# Patient Record
Sex: Female | Born: 1985 | Race: White | Hispanic: No | Marital: Married | Smoking: Current every day smoker
Health system: Southern US, Community
[De-identification: ages and names within clinical notes are randomized; demographics above are authoritative.]

## PROBLEM LIST (undated history)

## (undated) DIAGNOSIS — F329 Major depressive disorder, single episode, unspecified: Secondary | ICD-10-CM

## (undated) DIAGNOSIS — R112 Nausea with vomiting, unspecified: Secondary | ICD-10-CM

## (undated) DIAGNOSIS — F32A Depression, unspecified: Secondary | ICD-10-CM

## (undated) DIAGNOSIS — Z9889 Other specified postprocedural states: Secondary | ICD-10-CM

## (undated) DIAGNOSIS — Z8489 Family history of other specified conditions: Secondary | ICD-10-CM

## (undated) DIAGNOSIS — Z8709 Personal history of other diseases of the respiratory system: Secondary | ICD-10-CM

## (undated) HISTORY — PX: TONSILLECTOMY: SUR1361

## (undated) HISTORY — DX: Depression, unspecified: F32.A

## (undated) HISTORY — DX: Major depressive disorder, single episode, unspecified: F32.9

---
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
## 2005-08-22 ENCOUNTER — Other Ambulatory Visit: Admission: RE | Admit: 2005-08-22 | Discharge: 2005-08-22 | Payer: Self-pay | Admitting: Gynecology

## 2005-10-08 HISTORY — PX: WISDOM TOOTH EXTRACTION: SHX21

## 2006-09-23 ENCOUNTER — Ambulatory Visit: Payer: Self-pay | Admitting: "Endocrinology

## 2006-10-08 LAB — HM PAP SMEAR

## 2009-07-22 ENCOUNTER — Emergency Department (HOSPITAL_COMMUNITY): Admission: EM | Admit: 2009-07-22 | Discharge: 2009-07-22 | Payer: Self-pay | Admitting: Emergency Medicine

## 2011-01-11 LAB — COMPREHENSIVE METABOLIC PANEL
ALT: 22 U/L (ref 0–35)
BUN: 12 mg/dL (ref 6–23)
Chloride: 105 mEq/L (ref 96–112)
Creatinine, Ser: 0.7 mg/dL (ref 0.4–1.2)
GFR calc Af Amer: >60 mL/min (ref >60)
GFR calc non Af Amer: >60 mL/min (ref >60)
Glucose, Bld: 96 mg/dL (ref 70–99)
Sodium: 137 mEq/L (ref 135–145)
Total Bilirubin: 0.8 mg/dL (ref 0.3–1.2)
Total Protein: 7.4 g/dL (ref 6.0–8.3)

## 2011-01-11 LAB — CBC
MCV: 90.5 fL (ref 78.0–100.0)
Platelets: 188 10*3/uL (ref 150–400)
RDW: 13.5 % (ref 11.5–15.5)
WBC: 13.6 10*3/uL — ABNORMAL HIGH (ref 4.0–10.5)

## 2011-01-11 LAB — DIFFERENTIAL
Eosinophils Absolute: 0.3 10*3/uL (ref 0.0–0.7)
Eosinophils Relative: 2 % (ref 0–5)
Lymphs Abs: 3 10*3/uL (ref 0.7–4.0)
Monocytes Relative: 4 % (ref 3–12)
Neutro Abs: 9.7 10*3/uL — ABNORMAL HIGH (ref 1.7–7.7)

## 2011-01-11 LAB — URINALYSIS, ROUTINE W REFLEX MICROSCOPIC
Bilirubin Urine: NEGATIVE
Glucose, UA: NEGATIVE mg/dL
Leukocytes, UA: NEGATIVE
Urobilinogen, UA: 0.2 mg/dL (ref 0.0–1.0)

## 2011-01-11 LAB — URINE MICROSCOPIC-ADD ON

## 2011-01-11 LAB — POCT PREGNANCY, URINE: Preg Test, Ur: NEGATIVE

## 2011-02-23 NOTE — Discharge Summary (Signed)
NAME:  Allison Allison [name], Allison Allison [name]                   ACCOUNT NO.:  [id number]   MEDICAL RECORD NO.:  [id number]                   PATIENT TYPE:  INP   LOCATION:  0105                                 FACILITY:  [facility]   PHYSICIAN:  Nawar M. Alnaquib, M.D.             DATE OF BIRTH:  03/01/1986   DATE OF ADMISSION:  04/13/2003  DATE OF DISCHARGE:  04/17/2003                                 DISCHARGE SUMMARY   REASON FOR ADMISSION:  The patient was admitted for the reason of overdosing  on Wellbutrin.   HISTORY OF PRESENT ILLNESS:  This is Allison [name] 25 year old white female, 12th  grader, overdosed on Wellbutrin by taking several tablets the night before  admission.  The next day, she was brought by her family to [facility] and then over to [facility].  The patient  was depressed further.  The weekend of admission, she was having suicidal  thoughts and she had lied to her mom about having sex with her boyfriend.  She felt very guilty and then overdosed on Wellbutrin.  The patient had been  off Wellbutrin since May 2004.   JUSTIFICATION FOR 24-HOUR CARE:  Dangerous to self.   PAST PSYCHIATRIC HISTORY:  No previous psychiatric admissions but has had  some counseling.   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   MENTAL STATUS EXAM:  On admission, she was alert, well-focused, depressed.  Mood improved.  No suicidal or homicidal ideation.  Thoughts were racing.  Labile.  No psychotic symptoms.  No auditory or visual hallucinations.  No  delusions.   ADMISSION DIAGNOSES:   AXIS I:  1. Major depressive disorder, recurrent, with overdose on Wellbutrin.  2. Dysthymia.  3. History of substance abuse (marijuana).   AXIS II:  Borderline personality traits.   AXIS III:  None.   AXIS IV:  Psychosocial stressors (parent-child relation problems).   AXIS V:  Global Assessment of Functioning 35.   HOSPITAL COURSE:   The patient was started on Lexapro 10 mg and she did well  with that.  She participated in group and individual therapy and also did  well with the following laboratory tests.   LABORATORY DATA:  CBC differential count, blood chemistry, urinalysis, liver  profile and thyroid function tests were all normal.  No additional  laboratory tests were carried out.   MENTAL STATUS EXAM:  On discharge, alert, interactive, euthymic.  Normal  speech, not pressured.  No psychotic symptoms.  No depression.  Not suicidal  or homicidal.  Insight and judgment were remarkably improved.   DISCHARGE DIAGNOSES:   AXIS I:  Major depressive disorder, in remission.   AXIS II:  Borderline personality traits.   AXIS III:  None.   AXIS IV:  Parent-child relation problems (mother and stepbrother).   AXIS V:  Global Assessment of Functioning 70.   PLAN:  Discharge home.  FOLLOW UP:  [facility] with Mckinley Jewel on April 30, 2003.                                               Nawar M. Alnaquib, M.D.    NMA/MEDQ  D:  05/29/2003  T:  05/30/2003  Job:  [id number]

## 2011-02-23 NOTE — H&P (Signed)
NAME:  Allison Allison [name], Allison Allison [name]                   ACCOUNT NO.:  [id number]   MEDICAL RECORD NO.:  [id number]                   PATIENT TYPE:  INP   LOCATION:  0105                                 FACILITY:  [facility]   PHYSICIAN:  Nawar M. Alnaquib, M.D.             DATE OF BIRTH:  [birth year]   DATE OF ADMISSION:  04/13/2003  DATE OF DISCHARGE:  04/17/2003                         PSYCHIATRIC ADMISSION ASSESSMENT   REASON FOR ADMISSION:  The patient was admitted for the reason of overdosing  on Wellbutrin.   HISTORY OF PRESENT ILLNESS:  This is Allison [name] 25 year old white female 12th grader  overdosed on Wellbutrin by taking seven tablets on Monday night, the night  before admission.  On Tuesday, she was brought by her family to [facility] and then brought over to [facility].  The patient was depressed but was __________ .  However, the  weekend of admission, she was having suicidal thoughts __________ .  She has  lied to her mom about __________ .  The family __________  .  The patient  has seen __________  since May 2004.   JUSTIFICATION FOR 24 HOUR CARE:  Dangerous to self.   PAST PSYCHIATRIC HISTORY:  No prior psychiatric admissions or therapy.  She  has been August 02, 2004__________  for some time for __________ .   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   MENTAL STATUS EXAM ON ADMISSION:  Alert, well focused, depressed.  Mood:  Improved, sort of.  No suicidal or homicidal ideations, however.  Thoughts:  Racing __________ , labile __________  no psychotic symptoms, no auditory  and visual hallucinations, no delusions.  __________  fair.  Cognitive  ability: Fair.  __________   ADMISSION DIAGNOSES:   AXIS I:  1. Major depressive disorder, recurrent, with overdose on Wellbutrin.  2. Dysthymia.  3. History of substance abuse, marijuana.   AXIS II:  Borderline personality traits.   AXIS III:   None.   AXIS IV:  Psychosocial stressors: Parent-child relational problems, mother  and different __________   AXIS V:  Global assessment of functioning 35.   HOSPITAL COURSE:  The patient was started on Lexapro 10 mg Thursday.  The  following were the laboratory tests, which were diagnosed during her stay in  the hospital: Urinalysis was clear.  Urine for GC probe was negative.  CBC  and differential counts were within normal limits.  Chemistry 17 was normal.  Liver profile was normal.  TSH and T4, thyroid functions were normal.  Dipstick was normal.  Urine pregnancy test was negative.  RPR was  nonreactive.  __________  negative.  There were no additional laboratory  tests carried out.   __________  she participated well in group and individual therapy and family  meeting.  Prior to discharge, she seemed very well.  On April 17, 2003, she  was discharged, expressed feeling  well, ready to go home after the family  meeting, which went very well.   MENTAL STATUS EXAM ON DISCHARGE:  Alert, interactive, euthymic __________ .  Normal speech, not pressurized.  Ideas: No psychotic symptoms, no  depression, no suicidal or homicidal ideation.  Insight and judgment: Fairly  good.   DISCHARGE DIAGNOSES:   AXIS I:  Major depressive disorder, in remission.   AXIS II:  Borderline personality traits.   AXIS III:  None.   AXIS IV:  Parent-child relational problems, mother and stepfather.   AXIS V:  Global assessment of functioning 70.   PLAN:  Plan was for discharge home.   MEDICATIONS:  None.   FOLLOW UP:  [facility] with Mckinley Jewel on July 23  and he will be her __________  of psychiatric care post hospital discharge.  __________                                               Laney Pastor. Alnaquib, M.D.    NMA/MEDQ  D:  05/08/2003  T:  05/10/2003  Job:  [id number]

## 2011-02-23 NOTE — H&P (Signed)
NAME:  Allison [name], Allison [name]                   ACCOUNT NO.:  [id number]   MEDICAL RECORD NO.:  [id number]                   PATIENT TYPE:  INP   LOCATION:  0105                                 FACILITY:  [facility]   PHYSICIAN:  Nawar M. Alnaquib, M.D.             DATE OF BIRTH:  02-22-1986   DATE OF ADMISSION:  04/13/2003  DATE OF DISCHARGE:                         PSYCHIATRIC ADMISSION ASSESSMENT   HISTORY OF THE PRESENT ILLNESS:  [age]-year-old white female, 12th  grader, who overdosed on Wellbutrin, taking seven tablets on the night  before admission which was April 13, 2003, and the next day had been taken to  [facility], who eventually referred her to [facility].  The patient reports that she had been depressed, but her  depression she felt was getting better until the weekend of admission when  she had gone and smoked marijuana and had sex with her boyfriend and she  felt that she had lied to her mom about it.  She was ashamed of herself,  took an overdose of Wellbutrin, and then started throwing up repeatedly.  Mom had felt that she had been pregnant, but finally she admitted to her  that she had taken the overdose of Wellbutrin, so she was admitted here.  There is [name] major fearing of rejection on behalf of the patient by the rest  of the family members and she has been depressed for about two years and has  been on Wellbutrin until May 2004 when the patient stopped it.   JUSTIFICATION FOR 24 HOUR CARE:  Dangerous to self.   PAST PSYCHIATRIC HISTORY:  No prior admission.  Never been to see [name]  therapist.  She had been on Zoloft in the past and Effexor.  She was also on  the previous medication.  Dr. Senaida Ores was the one who prescribed these  medications, but the patient and mom had fired her since they felt she only  gave the medication to [place], but did not do therapy.   SUBSTANCE ABUSE HISTORY:  Marijuana positive.  History of alcohol  abuse in  the 9th grade and since then, now that she is in 12th grade, she had not  drank anything, she reports.   PAST MEDICAL HISTORY:  None.   ALLERGIES:  None.   CURRENT MEDICATIONS:  None.   MENTAL STATUS EXAM:  Alert, well focused, depressed; however, no suicidal  ideation.  No homicidal ideation.  She has severe feelings about hurting her  mom.  She appears very labile, impulsive, tearful.  There were, however, no  psychotic symptoms.  No auditory or visual hallucinations.  No delusions.  Insight and judgment only fair.  Cognitivity very good.  The patient's asset  is [name] good IQ and presents with [name] personality.   FAMILY HISTORY:  Strong for depression in mother, grandmother, grandfather,  who is mentally ill also.  Father also suffered from depression.   SOCIAL HISTORY:  The patient lives with mom and stepfather in [place].  She has two half sisters living with them.  One full brother who is in  college.  Mom is an Biomedical scientist and father is self-employed.  She __________  her college fund.  Stepfather is retired Acupuncturist.  The patient does not  like him.  He hates me, she says.  He has never abused her, however.    ADMITTING DIAGNOSES:   AXIS I:  1. Major depressive disorder, recurrent, with overdose attempt on     Wellbutrin.  2. Dysthymia.  3. History of substance abuse, alcohol.  4. Ongoing substance abuse, marijuana.   AXIS II:  Borderline personality trait.   AXIS III:  None.   AXIS IV:  Psychosocial stressors, severe.  Parent-child relation problems  with parents and siblings.   AXIS V:  Global assessment of functioning 25.   ESTIMATED LENGTH OF STAY:  One week.   PLACEMENT:  Home.   INITIAL PLAN OF CARE:  Group therapy.  Family meeting.  Medications--Lexapro  10 mg per day.                                               Nawar M. Alnaquib, M.D.    NMA/MEDQ  D:  04/17/2003  T:  04/17/2003  Job:  [id number]

## 2011-07-09 ENCOUNTER — Ambulatory Visit (INDEPENDENT_AMBULATORY_CARE_PROVIDER_SITE_OTHER): Payer: Self-pay | Admitting: Family Medicine

## 2011-07-09 ENCOUNTER — Encounter: Payer: Self-pay | Admitting: Family Medicine

## 2011-07-09 DIAGNOSIS — F329 Major depressive disorder, single episode, unspecified: Secondary | ICD-10-CM

## 2011-07-09 DIAGNOSIS — E785 Hyperlipidemia, unspecified: Secondary | ICD-10-CM

## 2011-07-09 DIAGNOSIS — G43909 Migraine, unspecified, not intractable, without status migrainosus: Secondary | ICD-10-CM | POA: Insufficient documentation

## 2011-07-09 DIAGNOSIS — E669 Obesity, unspecified: Secondary | ICD-10-CM

## 2011-07-09 DIAGNOSIS — F17200 Nicotine dependence, unspecified, uncomplicated: Secondary | ICD-10-CM

## 2011-07-09 MED ORDER — ESCITALOPRAM OXALATE 20 MG PO TABS: 40.0000 mg | ORAL_TABLET | Freq: Every day | ORAL | Status: DC

## 2011-07-09 NOTE — Patient Instructions (Addendum)
Check your records about a tetanus shot.   Please get the records form filled out and sent to our office. Keep working on your weight with diet and walking. I would continue the counseling with Dr. Ledon Snare.   I want to see you back in 12/12 for a visit, sooner if needed.   Take care.

## 2011-07-09 NOTE — Assessment & Plan Note (Signed)
Requesting records (pt didn't have the clinic address, so she'll get this and then drop off the form here for [place] to send).  No SI/HI, okay for outpatient f/u.  Continue counseling and rx done today for lexapro.  She agrees with plan.

## 2011-07-09 NOTE — Assessment & Plan Note (Signed)
D/w pt about diet and weight.  She is motivate and educated via nutrition.  I encouraged her today.

## 2011-07-09 NOTE — Progress Notes (Signed)
"  I'm trying to get healthy, trying to work on mood and weight."  Down 27 lbs with diet.  Prev had lost 70 lbs but regained it after her wedding.  She thinks weight is related to depression. Some boredom eating, some desire for comfort food followed by guilt.  Inc appetite before menses, esp salt cravings.    Depression/anxiety:  No SI/HI now.  Had a SA high school, with wellbutrin.  1 week inpatient stay.  No SA since then.  Back on lexapro for 4 weeks.  Inc in energy now on the medicine.  Less anxiety- prev had some social phobia, "I was becoming a shut in."  More interest in activities now on medicine.  Less guilty feeling.  Fewer migraines on SSRI recently.    Elevated Cholesterol: Using medications without problems:yes Muscle aches: no Diet compliance:yes Exercise:walking Other complaints:as above Eating 2-3 meals a day with snacks.  She has gone through nutrition prev.    Smoking, d/w pt.  She is not yet ready to quit.  Smokes menthols.  No sig cough, sputum.  No wheeze.    PMH and SH reviewed  Meds, vitals, and allergies reviewed.   ROS: See HPI.  Otherwise negative.    NAD overweight Speech fluent Judgement intact Affect wnl NCAT RRR CTAB No tremor

## 2011-07-09 NOTE — Assessment & Plan Note (Signed)
Improved on SSRI 

## 2011-07-09 NOTE — Assessment & Plan Note (Signed)
Requesting records.  No change in meds now.

## 2011-07-09 NOTE — Assessment & Plan Note (Signed)
Flu shot done today.  She'll work on smoking after her weight and mood are addressed.

## 2011-07-19 ENCOUNTER — Telehealth: Payer: Self-pay | Admitting: *Deleted

## 2011-07-19 NOTE — Telephone Encounter (Signed)
Pharmacy is questioning pt's lexapro dose. They said 40 mg's daily is a high dose, usually 20 mg's a day is the highest prescribed.

## 2011-07-19 NOTE — Telephone Encounter (Signed)
This was intended.  She had responded to that dose.  I would continue with 40mg  a day.  Thanks.

## 2011-07-20 NOTE — Telephone Encounter (Signed)
Pharmacy advised  

## 2011-07-24 ENCOUNTER — Telehealth: Payer: Self-pay | Admitting: Family Medicine

## 2011-07-24 MED ORDER — ESCITALOPRAM OXALATE 20 MG PO TABS: 60.0000 mg | ORAL_TABLET | Freq: Every day | ORAL | Status: DC

## 2011-07-24 NOTE — Telephone Encounter (Signed)
Call from Dr. Ledon Snare.  Much improved on lexparo, inc in energy, dec in anxiety, no SI/HI.  We talked about options.  Will inc lexapro to 60mg  and she'll f/u with Dr. Ledon Snare in 2 weeks.  Overall improved.  rx sent.

## 2011-07-26 ENCOUNTER — Encounter: Payer: Self-pay | Admitting: Family Medicine

## 2011-09-19 ENCOUNTER — Ambulatory Visit (INDEPENDENT_AMBULATORY_CARE_PROVIDER_SITE_OTHER): Payer: 59 | Admitting: Family Medicine

## 2011-09-19 ENCOUNTER — Encounter: Payer: Self-pay | Admitting: Family Medicine

## 2011-09-19 DIAGNOSIS — F329 Major depressive disorder, single episode, unspecified: Secondary | ICD-10-CM

## 2011-09-19 DIAGNOSIS — E785 Hyperlipidemia, unspecified: Secondary | ICD-10-CM

## 2011-09-19 MED ORDER — BUPROPION HCL ER (XL) 300 MG PO TB24: 300.0000 mg | ORAL_TABLET | Freq: Every day | ORAL | Status: DC

## 2011-09-19 NOTE — Patient Instructions (Addendum)
Please get me a copy of the labs from the most recent labs draws.  Good luck with the holidays- I think walking is a great idea to get a break.  Let me know if you have concerns.   Come back and see me in April, sooner if needed.  Glad to see you.

## 2011-09-19 NOTE — Progress Notes (Signed)
She's off lipitor and losing weight intentionally.  She'll drop off copy of labs and we can address the lipids after the weight is stabilized.    Depressed Mood: much improved.  Sleep decreased/ Insomnia/Early awakening: no,sleeping well Interest decreased in activities: no, this is improved and she's getting out of the house more Guilt or worthlessness: no Energy decreased: it's improved from prev Concentration difficulties: some, about the same as prev Appetite disturbance or weight loss: losing weight intentionally, but cutting carbs and walking Psychomotor retardation/agitation: no Suicidal thoughts: no Bright affect today.  Still in counseling, doing with this.   Migraines are very rare now.    The wellbutrin is helping with smoking urges.    PMH and SH reviewed  Meds, vitals, and allergies reviewed.   ROS: See HPI.  Otherwise negative.    NAD Speech fluent Judgement intact Affect bright NCAT RRR CTAB No tremor

## 2011-09-20 ENCOUNTER — Encounter: Payer: Self-pay | Admitting: Family Medicine

## 2011-09-20 NOTE — Assessment & Plan Note (Signed)
Hold statin for now and I'll look at her labs.  She's losing weight and we can address this in the future.

## 2011-09-20 NOTE — Assessment & Plan Note (Addendum)
Much improved, continue meds, no SI/HI.  Continue counseling and we'll recheck in a few months here in clinic, sooner if needed.  She agrees with plan.  Okay for outpatient f/u.

## 2011-10-22 ENCOUNTER — Telehealth: Payer: Self-pay | Admitting: *Deleted

## 2011-10-22 NOTE — Telephone Encounter (Signed)
Dr. Ledon Snare says that the patient is going on a cruise and is having a lot of anxiety about being around people, crowds, etc.  He is asking if you would prescribe Xanax (Extended Release) .5 mg  #10/ 0 RF to [facility] in [place] [phone].  Please advise patient at [phone].  Patient is leaving for the cruise on Thursday.

## 2011-10-23 MED ORDER — ALPRAZOLAM ER 0.5 MG PO TB24: ORAL_TABLET | ORAL | Status: DC

## 2011-10-23 NOTE — Telephone Encounter (Signed)
Rx called to pharmacy as instructed. Patient notified by way of voicemail that rx has been called to pharmacy.

## 2011-10-23 NOTE — Telephone Encounter (Signed)
I agree.  Please call in and notify pt.  Thanks.

## 2011-12-04 ENCOUNTER — Encounter (INDEPENDENT_AMBULATORY_CARE_PROVIDER_SITE_OTHER): Payer: 59 | Admitting: Registered Nurse

## 2011-12-04 DIAGNOSIS — O209 Hemorrhage in early pregnancy, unspecified: Secondary | ICD-10-CM

## 2011-12-06 ENCOUNTER — Encounter (INDEPENDENT_AMBULATORY_CARE_PROVIDER_SITE_OTHER): Payer: 59 | Admitting: Registered Nurse

## 2011-12-06 ENCOUNTER — Other Ambulatory Visit (INDEPENDENT_AMBULATORY_CARE_PROVIDER_SITE_OTHER): Payer: 59

## 2011-12-06 ENCOUNTER — Encounter (INDEPENDENT_AMBULATORY_CARE_PROVIDER_SITE_OTHER): Payer: 59

## 2011-12-06 DIAGNOSIS — O26859 Spotting complicating pregnancy, unspecified trimester: Secondary | ICD-10-CM

## 2011-12-06 DIAGNOSIS — Z3480 Encounter for supervision of other normal pregnancy, unspecified trimester: Secondary | ICD-10-CM

## 2011-12-06 DIAGNOSIS — O26849 Uterine size-date discrepancy, unspecified trimester: Secondary | ICD-10-CM

## 2011-12-06 DIAGNOSIS — O200 Threatened abortion: Secondary | ICD-10-CM

## 2011-12-12 ENCOUNTER — Encounter (INDEPENDENT_AMBULATORY_CARE_PROVIDER_SITE_OTHER): Payer: Medicaid Other

## 2011-12-12 DIAGNOSIS — Z3201 Encounter for pregnancy test, result positive: Secondary | ICD-10-CM

## 2011-12-12 LAB — ABO/RH: RH Type: POSITIVE

## 2011-12-12 LAB — ANTIBODY SCREEN: Antibody Screen: NEGATIVE

## 2011-12-12 LAB — HIV ANTIBODY (ROUTINE TESTING W REFLEX): HIV: NONREACTIVE

## 2011-12-28 ENCOUNTER — Encounter (INDEPENDENT_AMBULATORY_CARE_PROVIDER_SITE_OTHER): Payer: 59 | Admitting: Obstetrics and Gynecology

## 2011-12-28 DIAGNOSIS — O26849 Uterine size-date discrepancy, unspecified trimester: Secondary | ICD-10-CM

## 2011-12-28 DIAGNOSIS — Z01419 Encounter for gynecological examination (general) (routine) without abnormal findings: Secondary | ICD-10-CM

## 2011-12-28 DIAGNOSIS — Z331 Pregnant state, incidental: Secondary | ICD-10-CM

## 2011-12-28 LAB — OB RESULTS CONSOLE GC/CHLAMYDIA
Chlamydia: NEGATIVE
Gonorrhea: NEGATIVE

## 2012-01-24 DIAGNOSIS — F1910 Other psychoactive substance abuse, uncomplicated: Secondary | ICD-10-CM | POA: Insufficient documentation

## 2012-01-25 ENCOUNTER — Encounter: Payer: Self-pay | Admitting: Obstetrics and Gynecology

## 2012-01-25 ENCOUNTER — Ambulatory Visit (INDEPENDENT_AMBULATORY_CARE_PROVIDER_SITE_OTHER): Payer: Medicaid Other | Admitting: Obstetrics and Gynecology

## 2012-01-25 ENCOUNTER — Ambulatory Visit: Payer: 59 | Admitting: Family Medicine

## 2012-01-25 VITALS — BP 136/78 | Wt 273.0 lb

## 2012-01-25 DIAGNOSIS — Z331 Pregnant state, incidental: Secondary | ICD-10-CM

## 2012-01-25 NOTE — Progress Notes (Signed)
No complaints Ultrasound at next visit

## 2012-01-29 ENCOUNTER — Telehealth: Payer: Self-pay | Admitting: *Deleted

## 2012-01-29 NOTE — Telephone Encounter (Signed)
Received faxed request from pharmacy for prior authorization on generic Lexapro. Pharmacist states that patient has changed insurances and now has medicaid. Patient is on 2 tablets daily and this exceeds plan limitations. Called and spoke with Vernona Rieger with Medicaid and was advised that they will not approve anything over their plan limitations. Vernona Rieger stated that she could possibly be changed to something else on their list of approved medications. She suggested visiting their website for approved medications at [email or web address] Form is in your in box from the pharmacy.

## 2012-02-01 ENCOUNTER — Telehealth: Payer: Self-pay | Admitting: Obstetrics and Gynecology

## 2012-02-01 MED ORDER — CITALOPRAM HYDROBROMIDE 40 MG PO TABS: 80.0000 mg | ORAL_TABLET | Freq: Every day | ORAL | Status: DC

## 2012-02-01 NOTE — Telephone Encounter (Signed)
Routed to triage/electroic

## 2012-02-01 NOTE — Telephone Encounter (Signed)
Patient advised.

## 2012-02-01 NOTE — Telephone Encounter (Signed)
Change to celexa 40mg , 2 pills a day.  rx sent. If medicaid won't cover it, she could get it on the low cost plans at the pharmacies.  She needs to run this med change by her OB GYN.

## 2012-02-05 NOTE — Telephone Encounter (Signed)
On Lexapro 40mg  daily.  To be changed to Celexa 80mg  1 daily.  Dr Para March is who she sees for this.  Pt wants to know if this change is ok.Pt currently 16 weeks preg.  ld

## 2012-02-05 NOTE — Telephone Encounter (Signed)
Received prior authorization request from [facility]. Notified Chrissie at [facility] that pt should get on low cost plan. Chrissie said pt already picked up for $8.00.

## 2012-02-05 NOTE — Telephone Encounter (Signed)
ROUTED TO LAURA

## 2012-02-06 ENCOUNTER — Telehealth: Payer: Self-pay

## 2012-02-06 NOTE — Telephone Encounter (Signed)
Patient notified as instructed by telephone. 

## 2012-02-06 NOTE — Telephone Encounter (Signed)
That is the equivalent dose of 40mg  of lexapro, what she was prev taking.  If she did well on that, then she should be able to tolerate the 80mg  of celexa.  If not tolerated, then notify MD.  That is the best available option given her prev med and dose.

## 2012-02-06 NOTE — Telephone Encounter (Signed)
Pt left v/m to verify Celexa dosage is 80 mg daily. Pt said on line FDA warnings of more than 40 mg daily. Pt can be reached at [phone].Please advise.

## 2012-02-07 NOTE — Telephone Encounter (Signed)
yes

## 2012-02-29 ENCOUNTER — Encounter: Payer: Self-pay | Admitting: Obstetrics and Gynecology

## 2012-02-29 ENCOUNTER — Ambulatory Visit (INDEPENDENT_AMBULATORY_CARE_PROVIDER_SITE_OTHER): Payer: Medicaid Other

## 2012-02-29 ENCOUNTER — Encounter: Payer: Medicaid Other | Admitting: Obstetrics and Gynecology

## 2012-02-29 ENCOUNTER — Ambulatory Visit (INDEPENDENT_AMBULATORY_CARE_PROVIDER_SITE_OTHER): Payer: Medicaid Other | Admitting: Obstetrics and Gynecology

## 2012-02-29 ENCOUNTER — Other Ambulatory Visit: Payer: Self-pay | Admitting: Obstetrics and Gynecology

## 2012-02-29 VITALS — BP 128/68 | Wt 284.0 lb

## 2012-02-29 DIAGNOSIS — Z331 Pregnant state, incidental: Secondary | ICD-10-CM

## 2012-02-29 DIAGNOSIS — O321XX0 Maternal care for breech presentation, not applicable or unspecified: Secondary | ICD-10-CM

## 2012-02-29 LAB — US OB DETAIL + 14 WK

## 2012-02-29 NOTE — Progress Notes (Signed)
Ultrasound shows:  SIUP  S=D     [place] EDD: 07/23/12            AFI: normal           Cervical length: 3.2 cm           Placenta localization: anterior           Fetal presentation: breech                    Anatomy survey is normal except spine not well visualized and bilateral CP cysts  R=6.3 mm   L=82mm           Gender : female

## 2012-02-29 NOTE — Progress Notes (Signed)
Pt c/o soreness in inner thigh. Also c/o pain at pubic bone.

## 2012-03-25 ENCOUNTER — Ambulatory Visit (INDEPENDENT_AMBULATORY_CARE_PROVIDER_SITE_OTHER): Payer: Medicaid Other

## 2012-03-25 ENCOUNTER — Ambulatory Visit (INDEPENDENT_AMBULATORY_CARE_PROVIDER_SITE_OTHER): Payer: Medicaid Other | Admitting: Obstetrics and Gynecology

## 2012-03-25 VITALS — BP 130/76 | Wt 284.0 lb

## 2012-03-25 DIAGNOSIS — O358XX0 Maternal care for other (suspected) fetal abnormality and damage, not applicable or unspecified: Secondary | ICD-10-CM

## 2012-03-25 DIAGNOSIS — Z331 Pregnant state, incidental: Secondary | ICD-10-CM

## 2012-03-25 NOTE — Progress Notes (Signed)
Patient ID: Allison [name], female   DOB: 1985/12/12, 26 y.o.   MRN: [id number] USS: Fetal spine visualized: normal CP resolved Growth normal CX closed F/U 3 Gtt 4 weeks   Lavera Guise, CNM

## 2012-03-25 NOTE — Progress Notes (Signed)
Pt. Stated no issues today f/u US today. Pt needs to leave a urin sample .

## 2012-04-22 ENCOUNTER — Ambulatory Visit (INDEPENDENT_AMBULATORY_CARE_PROVIDER_SITE_OTHER): Payer: Medicaid Other | Admitting: Obstetrics and Gynecology

## 2012-04-22 ENCOUNTER — Encounter: Payer: Self-pay | Admitting: Obstetrics and Gynecology

## 2012-04-22 VITALS — BP 120/78 | Wt 289.0 lb

## 2012-04-22 DIAGNOSIS — Z331 Pregnant state, incidental: Secondary | ICD-10-CM

## 2012-04-22 LAB — US OB FOLLOW UP

## 2012-04-22 LAB — HEMOGLOBIN: Hemoglobin: 11.6 g/dL — ABNORMAL LOW (ref 12.0–15.0)

## 2012-04-22 NOTE — Patient Instructions (Signed)
Fetal Movement Counts Patient Name: __________________________________________________ Patient Due Date: ____________________ Kick counts is highly recommended in high risk pregnancies, but it is a good idea for every pregnant woman to do. Start counting fetal movements at 28 weeks of the pregnancy. Fetal movements increase after eating a full meal or eating or drinking something sweet (the blood sugar is higher). It is also important to drink plenty of fluids (well hydrated) before doing the count. Lie on your left side because it helps with the circulation or you can sit in a comfortable chair with your arms over your belly (abdomen) with no distractions around you. DOING THE COUNT  Try to do the count the same time of day each time you do it.   Mark the day and time, then see how long it takes for you to feel 10 movements (kicks, flutters, swishes, rolls). You should have at least 10 movements within 2 hours. You will most likely feel 10 movements in much less than 2 hours. If you do not, wait an hour and count again. After a couple of days you will see a pattern.   What you are looking for is a change in the pattern or not enough counts in 2 hours. Is it taking longer in time to reach 10 movements?  SEEK MEDICAL CARE IF:  You feel less than 10 counts in 2 hours. Tried twice.   No movement in one hour.   The pattern is changing or taking longer each day to reach 10 counts in 2 hours.   You feel the baby is not moving as it usually does.  Date: ____________ Movements: ____________ Start time: ____________ Finish time: ____________  Date: ____________ Movements: ____________ Start time: ____________ Finish time: ____________ Date: ____________ Movements: ____________ Start time: ____________ Finish time: ____________ Date: ____________ Movements: ____________ Start time: ____________ Finish time: ____________ Date: ____________ Movements: ____________ Start time: ____________ Finish time:  ____________ Date: ____________ Movements: ____________ Start time: ____________ Finish time: ____________ Date: ____________ Movements: ____________ Start time: ____________ Finish time: ____________ Date: ____________ Movements: ____________ Start time: ____________ Finish time: ____________  Date: ____________ Movements: ____________ Start time: ____________ Finish time: ____________ Date: ____________ Movements: ____________ Start time: ____________ Finish time: ____________ Date: ____________ Movements: ____________ Start time: ____________ Finish time: ____________ Date: ____________ Movements: ____________ Start time: ____________ Finish time: ____________ Date: ____________ Movements: ____________ Start time: ____________ Finish time: ____________ Date: ____________ Movements: ____________ Start time: ____________ Finish time: ____________ Date: ____________ Movements: ____________ Start time: ____________ Finish time: ____________  Date: ____________ Movements: ____________ Start time: ____________ Finish time: ____________ Date: ____________ Movements: ____________ Start time: ____________ Finish time: ____________ Date: ____________ Movements: ____________ Start time: ____________ Finish time: ____________ Date: ____________ Movements: ____________ Start time: ____________ Finish time: ____________ Date: ____________ Movements: ____________ Start time: ____________ Finish time: ____________ Date: ____________ Movements: ____________ Start time: ____________ Finish time: ____________ Date: ____________ Movements: ____________ Start time: ____________ Finish time: ____________  Date: ____________ Movements: ____________ Start time: ____________ Finish time: ____________ Date: ____________ Movements: ____________ Start time: ____________ Finish time: ____________ Date: ____________ Movements: ____________ Start time: ____________ Finish time: ____________ Date: ____________ Movements:  ____________ Start time: ____________ Finish time: ____________ Date: ____________ Movements: ____________ Start time: ____________ Finish time: ____________ Date: ____________ Movements: ____________ Start time: ____________ Finish time: ____________ Date: ____________ Movements: ____________ Start time: ____________ Finish time: ____________  Date: ____________ Movements: ____________ Start time: ____________ Finish time: ____________ Date: ____________ Movements: ____________ Start time: ____________ Finish time: ____________ Date: ____________ Movements: ____________ Start time:   ____________ Finish time: ____________ Date: ____________ Movements: ____________ Start time: ____________ Finish time: ____________ Date: ____________ Movements: ____________ Start time: ____________ Finish time: ____________ Date: ____________ Movements: ____________ Start time: ____________ Finish time: ____________ Date: ____________ Movements: ____________ Start time: ____________ Finish time: ____________  Date: ____________ Movements: ____________ Start time: ____________ Finish time: ____________ Date: ____________ Movements: ____________ Start time: ____________ Finish time: ____________ Date: ____________ Movements: ____________ Start time: ____________ Finish time: ____________ Date: ____________ Movements: ____________ Start time: ____________ Finish time: ____________ Date: ____________ Movements: ____________ Start time: ____________ Finish time: ____________ Date: ____________ Movements: ____________ Start time: ____________ Finish time: ____________ Date: ____________ Movements: ____________ Start time: ____________ Finish time: ____________  Date: ____________ Movements: ____________ Start time: ____________ Finish time: ____________ Date: ____________ Movements: ____________ Start time: ____________ Finish time: ____________ Date: ____________ Movements: ____________ Start time: ____________ Finish  time: ____________ Date: ____________ Movements: ____________ Start time: ____________ Finish time: ____________ Date: ____________ Movements: ____________ Start time: ____________ Finish time: ____________ Date: ____________ Movements: ____________ Start time: ____________ Finish time: ____________ Date: ____________ Movements: ____________ Start time: ____________ Finish time: ____________  Date: ____________ Movements: ____________ Start time: ____________ Finish time: ____________ Date: ____________ Movements: ____________ Start time: ____________ Finish time: ____________ Date: ____________ Movements: ____________ Start time: ____________ Finish time: ____________ Date: ____________ Movements: ____________ Start time: ____________ Finish time: ____________ Date: ____________ Movements: ____________ Start time: ____________ Finish time: ____________ Date: ____________ Movements: ____________ Start time: ____________ Finish time: ____________ Document Released: 10/24/2006 Document Revised: 09/13/2011 Document Reviewed: 04/26/2009 [employer] Patient Information 2012 [employer]. 

## 2012-04-22 NOTE — Progress Notes (Signed)
Pt c/o pain in lower back and pubic bone. Glucola given.

## 2012-04-22 NOTE — Progress Notes (Signed)
A/P Glucola, hemoglobin and RPR today Fetal kick counts reviewed All patients questions answered Return in two weeks Continue Prenatal vitamins Blood type B positive Pt with a blister on her big toe.  Will refer to podiatry

## 2012-04-29 ENCOUNTER — Telehealth: Payer: Self-pay | Admitting: Obstetrics and Gynecology

## 2012-04-29 NOTE — Telephone Encounter (Signed)
Spoke with pt rgd msg informed labs wnl

## 2012-05-04 ENCOUNTER — Telehealth: Payer: Self-pay | Admitting: Obstetrics and Gynecology

## 2012-05-04 NOTE — Telephone Encounter (Signed)
TC from pt c/o dizziness x1 week. Had 1 episode of vomiting last night. Denies any HA/N/V or dizziness currently. States she's taken her BP several times at home w automatic cuff and at [facility] and it was elevated. Reports occ BH ctx, none now. No bleeding or LOF, GFM. Instructed pt to increase PO hydration BP readings are probably not accurate. Has an appt for Tuesday. Offered MAU visit if sx's persist, come to MAU if HA/N/V/RUQ pain or ctx, or dec FM. Pt verbalizes understanding, declines MAU visit at this time.

## 2012-05-06 ENCOUNTER — Encounter: Payer: Self-pay | Admitting: Obstetrics and Gynecology

## 2012-05-06 ENCOUNTER — Ambulatory Visit (INDEPENDENT_AMBULATORY_CARE_PROVIDER_SITE_OTHER): Payer: Medicaid Other | Admitting: Obstetrics and Gynecology

## 2012-05-06 VITALS — BP 132/76 | Wt 292.0 lb

## 2012-05-06 DIAGNOSIS — R030 Elevated blood-pressure reading, without diagnosis of hypertension: Secondary | ICD-10-CM

## 2012-05-06 DIAGNOSIS — N898 Other specified noninflammatory disorders of vagina: Secondary | ICD-10-CM

## 2012-05-06 LAB — CBC
HCT: 38.5 % (ref 36.0–46.0)
MCH: 29.1 pg (ref 26.0–34.0)
MCHC: 33.8 g/dL (ref 30.0–36.0)
MCV: 86.3 fL (ref 78.0–100.0)
RBC: 4.46 MIL/uL (ref 3.87–5.11)
RDW: 14.3 % (ref 11.5–15.5)
WBC: 10.5 10*3/uL (ref 4.0–10.5)

## 2012-05-06 LAB — FETAL FIBRONECTIN: Fetal Fibronectin: NEGATIVE

## 2012-05-06 LAB — POCT WET PREP (WET MOUNT)
Bacteria Wet Prep HPF POC: NEGATIVE
Clue Cells Wet Prep Whiff POC: NEGATIVE
Trichomonas Wet Prep HPF POC: NEGATIVE
WBC, Wet Prep HPF POC: NEGATIVE

## 2012-05-06 NOTE — Progress Notes (Signed)
Pt c/o increased d/c. Wet prep neg.  Cx closed by 50% effaced.  FFN sent. Also states that she is very dizzy during the day and nauseous as night. States that she checked her bp at home with automated machine Sunday and her bp was 160/103. Has hx of increased BP which improved after wt loss. Rec: PIH labs  FFN 1 hr glucola nl Neg RPR Nl Hgb

## 2012-05-07 ENCOUNTER — Telehealth: Payer: Self-pay | Admitting: Obstetrics and Gynecology

## 2012-05-07 LAB — COMPREHENSIVE METABOLIC PANEL
AST: 27 U/L (ref 0–37)
Albumin: 3.8 g/dL (ref 3.5–5.2)
Alkaline Phosphatase: 74 U/L (ref 39–117)
BUN: 8 mg/dL (ref 6–23)
Glucose, Bld: 74 mg/dL (ref 70–99)
Total Protein: 6.1 g/dL (ref 6.0–8.3)

## 2012-05-07 NOTE — Telephone Encounter (Signed)
Triage/epic 

## 2012-05-08 NOTE — Telephone Encounter (Signed)
Pt told FFN=negative. Pt agrees.

## 2012-05-20 ENCOUNTER — Telehealth: Payer: Self-pay | Admitting: Obstetrics and Gynecology

## 2012-05-20 ENCOUNTER — Ambulatory Visit (INDEPENDENT_AMBULATORY_CARE_PROVIDER_SITE_OTHER): Payer: Medicaid Other | Admitting: Obstetrics and Gynecology

## 2012-05-20 VITALS — BP 148/96 | Wt 296.0 lb

## 2012-05-20 DIAGNOSIS — Z331 Pregnant state, incidental: Secondary | ICD-10-CM

## 2012-05-20 NOTE — Telephone Encounter (Signed)
Attempt to call  Patient for result of FNN  As numbers on file in" Demographic" Unable to contact patient directly and left voicemail.   [redacted]w[redacted]d Had been in the office with Dr Stefano Gaul today and had been cramping. FFN performed - result POSITIVE   [name], CNM.

## 2012-05-20 NOTE — Progress Notes (Signed)
[redacted]w[redacted]d The patient denies headaches, blurred vision, and right upper quadrant pain.  She reports that she has a past history of elevated blood pressures.  PIH labs were normal to weeks ago. Abdomen nontender.  Repeat blood pressure: 148/84  .  Reflexes: normal; no clonus . Collect 24-hour urine protein.  Preeclampsia discussed.  Return office in one to 2 days. Fetal fibronectin sent. Kick counts. Dr. Stefano Gaul

## 2012-05-20 NOTE — Progress Notes (Signed)
Pt states this past weekend and last night she experienced "cramps" Pt states cramps were like "period cramps" and back spasms.  Pt states she has been having a upset stomach.  Pt states she has been feeling dizzy, and all this week she has been feeling anxious. BP is high today.

## 2012-05-20 NOTE — Patient Instructions (Signed)
Fetal Movement Counts Patient Name: __________________________________________________ Patient Due Date: ____________________ Kick counts is highly recommended in high risk pregnancies, but it is a good idea for every pregnant woman to do. Start counting fetal movements at 28 weeks of the pregnancy. Fetal movements increase after eating a full meal or eating or drinking something sweet (the blood sugar is higher). It is also important to drink plenty of fluids (well hydrated) before doing the count. Lie on your left side because it helps with the circulation or you can sit in a comfortable chair with your arms over your belly (abdomen) with no distractions around you. DOING THE COUNT  Try to do the count the same time of day each time you do it.   Mark the day and time, then see how long it takes for you to feel 10 movements (kicks, flutters, swishes, rolls). You should have at least 10 movements within 2 hours. You will most likely feel 10 movements in much less than 2 hours. If you do not, wait an hour and count again. After a couple of days you will see a pattern.   What you are looking for is a change in the pattern or not enough counts in 2 hours. Is it taking longer in time to reach 10 movements?  SEEK MEDICAL CARE IF:  You feel less than 10 counts in 2 hours. Tried twice.   No movement in one hour.   The pattern is changing or taking longer each day to reach 10 counts in 2 hours.   You feel the baby is not moving as it usually does.  Date: ____________ Movements: ____________ Start time: ____________ Finish time: ____________  Date: ____________ Movements: ____________ Start time: ____________ Finish time: ____________ Date: ____________ Movements: ____________ Start time: ____________ Finish time: ____________ Date: ____________ Movements: ____________ Start time: ____________ Finish time: ____________ Date: ____________ Movements: ____________ Start time: ____________ Finish time:  ____________ Date: ____________ Movements: ____________ Start time: ____________ Finish time: ____________ Date: ____________ Movements: ____________ Start time: ____________ Finish time: ____________ Date: ____________ Movements: ____________ Start time: ____________ Finish time: ____________  Date: ____________ Movements: ____________ Start time: ____________ Finish time: ____________ Date: ____________ Movements: ____________ Start time: ____________ Finish time: ____________ Date: ____________ Movements: ____________ Start time: ____________ Finish time: ____________ Date: ____________ Movements: ____________ Start time: ____________ Finish time: ____________ Date: ____________ Movements: ____________ Start time: ____________ Finish time: ____________ Date: ____________ Movements: ____________ Start time: ____________ Finish time: ____________ Date: ____________ Movements: ____________ Start time: ____________ Finish time: ____________  Date: ____________ Movements: ____________ Start time: ____________ Finish time: ____________ Date: ____________ Movements: ____________ Start time: ____________ Finish time: ____________ Date: ____________ Movements: ____________ Start time: ____________ Finish time: ____________ Date: ____________ Movements: ____________ Start time: ____________ Finish time: ____________ Date: ____________ Movements: ____________ Start time: ____________ Finish time: ____________ Date: ____________ Movements: ____________ Start time: ____________ Finish time: ____________ Date: ____________ Movements: ____________ Start time: ____________ Finish time: ____________  Date: ____________ Movements: ____________ Start time: ____________ Finish time: ____________ Date: ____________ Movements: ____________ Start time: ____________ Finish time: ____________ Date: ____________ Movements: ____________ Start time: ____________ Finish time: ____________ Date: ____________ Movements:  ____________ Start time: ____________ Finish time: ____________ Date: ____________ Movements: ____________ Start time: ____________ Finish time: ____________ Date: ____________ Movements: ____________ Start time: ____________ Finish time: ____________ Date: ____________ Movements: ____________ Start time: ____________ Finish time: ____________  Date: ____________ Movements: ____________ Start time: ____________ Finish time: ____________ Date: ____________ Movements: ____________ Start time: ____________ Finish time: ____________ Date: ____________ Movements: ____________ Start time:   ____________ Finish time: ____________ Date: ____________ Movements: ____________ Start time: ____________ Finish time: ____________ Date: ____________ Movements: ____________ Start time: ____________ Finish time: ____________ Date: ____________ Movements: ____________ Start time: ____________ Finish time: ____________ Date: ____________ Movements: ____________ Start time: ____________ Finish time: ____________  Date: ____________ Movements: ____________ Start time: ____________ Finish time: ____________ Date: ____________ Movements: ____________ Start time: ____________ Finish time: ____________ Date: ____________ Movements: ____________ Start time: ____________ Finish time: ____________ Date: ____________ Movements: ____________ Start time: ____________ Finish time: ____________ Date: ____________ Movements: ____________ Start time: ____________ Finish time: ____________ Date: ____________ Movements: ____________ Start time: ____________ Finish time: ____________ Date: ____________ Movements: ____________ Start time: ____________ Finish time: ____________  Date: ____________ Movements: ____________ Start time: ____________ Finish time: ____________ Date: ____________ Movements: ____________ Start time: ____________ Finish time: ____________ Date: ____________ Movements: ____________ Start time: ____________ Finish  time: ____________ Date: ____________ Movements: ____________ Start time: ____________ Finish time: ____________ Date: ____________ Movements: ____________ Start time: ____________ Finish time: ____________ Date: ____________ Movements: ____________ Start time: ____________ Finish time: ____________ Date: ____________ Movements: ____________ Start time: ____________ Finish time: ____________  Date: ____________ Movements: ____________ Start time: ____________ Finish time: ____________ Date: ____________ Movements: ____________ Start time: ____________ Finish time: ____________ Date: ____________ Movements: ____________ Start time: ____________ Finish time: ____________ Date: ____________ Movements: ____________ Start time: ____________ Finish time: ____________ Date: ____________ Movements: ____________ Start time: ____________ Finish time: ____________ Date: ____________ Movements: ____________ Start time: ____________ Finish time: ____________ Document Released: 10/24/2006 Document Revised: 09/13/2011 Document Reviewed: 04/26/2009 [employer] Patient Information 2012 [employer]. 

## 2012-05-20 NOTE — Addendum Note (Signed)
Addended by: Tim Lair on: 05/20/2012 03:00 PM   Modules accepted: Orders

## 2012-05-21 ENCOUNTER — Other Ambulatory Visit: Payer: Medicaid Other

## 2012-05-21 ENCOUNTER — Observation Stay (HOSPITAL_COMMUNITY)
Admission: AD | Admit: 2012-05-21 | Discharge: 2012-05-23 | Disposition: A | Discharge status: Home / Self Care | Payer: Medicaid Other | Source: Ambulatory Visit | Attending: Obstetrics and Gynecology | Admitting: Obstetrics and Gynecology

## 2012-05-21 ENCOUNTER — Other Ambulatory Visit: Payer: Self-pay | Admitting: Obstetrics and Gynecology

## 2012-05-21 ENCOUNTER — Encounter (HOSPITAL_COMMUNITY): Payer: Self-pay | Admitting: *Deleted

## 2012-05-21 DIAGNOSIS — O139 Gestational [pregnancy-induced] hypertension without significant proteinuria, unspecified trimester: Secondary | ICD-10-CM

## 2012-05-21 DIAGNOSIS — F3289 Other specified depressive episodes: Secondary | ICD-10-CM | POA: Insufficient documentation

## 2012-05-21 DIAGNOSIS — O99340 Other mental disorders complicating pregnancy, unspecified trimester: Secondary | ICD-10-CM | POA: Insufficient documentation

## 2012-05-21 DIAGNOSIS — E669 Obesity, unspecified: Secondary | ICD-10-CM | POA: Insufficient documentation

## 2012-05-21 DIAGNOSIS — F329 Major depressive disorder, single episode, unspecified: Secondary | ICD-10-CM | POA: Insufficient documentation

## 2012-05-21 DIAGNOSIS — O99330 Smoking (tobacco) complicating pregnancy, unspecified trimester: Secondary | ICD-10-CM | POA: Insufficient documentation

## 2012-05-21 LAB — CREATININE CLEARANCE, URINE, 24 HOUR
Collection Interval-CRCL: 24 hours
Urine Total Volume-CRCL: 1040 mL

## 2012-05-21 LAB — CBC WITH DIFFERENTIAL/PLATELET
Basophils Relative: 0 % (ref 0–1)
Eosinophils Absolute: 0.1 10*3/uL (ref 0.0–0.7)
Hemoglobin: 12.7 g/dL (ref 12.0–15.0)
Lymphs Abs: 2.1 10*3/uL (ref 0.7–4.0)
MCH: 29.6 pg (ref 26.0–34.0)
Monocytes Relative: 5 % (ref 3–12)
Neutro Abs: 8.1 10*3/uL — ABNORMAL HIGH (ref 1.7–7.7)
Neutrophils Relative %: 74 % (ref 43–77)
Platelets: 163 10*3/uL (ref 150–400)
RBC: 4.29 MIL/uL (ref 3.87–5.11)

## 2012-05-21 LAB — COMPREHENSIVE METABOLIC PANEL
BUN: 10 mg/dL (ref 6–23)
CO2: 21 mEq/L (ref 19–32)
Calcium: 9.5 mg/dL (ref 8.4–10.5)
Creatinine, Ser: 0.45 mg/dL — ABNORMAL LOW (ref 0.50–1.10)
GFR calc Af Amer: >90 mL/min (ref >90)
GFR calc non Af Amer: >90 mL/min (ref >90)
Glucose, Bld: 79 mg/dL (ref 70–99)
Total Protein: 6.4 g/dL (ref 6.0–8.3)

## 2012-05-21 LAB — PROTEIN, URINE, 24 HOUR
Protein, 24H Urine: 94 mg/d (ref 50–100)
Protein, Urine: 9 mg/dL

## 2012-05-21 LAB — URIC ACID: Uric Acid, Serum: 6.2 mg/dL (ref 2.4–7.0)

## 2012-05-21 LAB — LACTATE DEHYDROGENASE: LDH: 131 U/L (ref 94–250)

## 2012-05-21 MED ORDER — ZOLPIDEM TARTRATE 5 MG PO TABS
5.0000 mg | ORAL_TABLET | Freq: Every evening | ORAL | Status: DC | PRN
  Administered 2012-05-21 – 2012-05-22 (×2): 5 mg via ORAL
  Filled 2012-05-21 (×2): qty 1

## 2012-05-21 MED ORDER — BETAMETHASONE SOD PHOS & ACET 6 (3-3) MG/ML IJ SUSP
12.0000 mg | Freq: Once | INTRAMUSCULAR | Status: AC
  Administered 2012-05-22: 12 mg via INTRAMUSCULAR
  Filled 2012-05-21: qty 2

## 2012-05-21 MED ORDER — BETAMETHASONE SOD PHOS & ACET 6 (3-3) MG/ML IJ SUSP
12.0000 mg | Freq: Once | INTRAMUSCULAR | Status: AC
  Administered 2012-05-21: 12 mg via INTRAMUSCULAR
  Filled 2012-05-21: qty 2

## 2012-05-21 MED ORDER — CITALOPRAM HYDROBROMIDE 40 MG PO TABS
40.0000 mg | ORAL_TABLET | Freq: Every day | ORAL | Status: DC
  Administered 2012-05-21 – 2012-05-22 (×2): 40 mg via ORAL
  Filled 2012-05-21 (×3): qty 1

## 2012-05-21 MED ORDER — LABETALOL HCL 200 MG PO TABS
200.0000 mg | ORAL_TABLET | Freq: Two times a day (BID) | ORAL | Status: DC
  Administered 2012-05-22: 200 mg via ORAL
  Filled 2012-05-21 (×2): qty 1
  Filled 2012-05-21: qty 2

## 2012-05-21 MED ORDER — PRENATAL MULTIVITAMIN CH
1.0000 | ORAL_TABLET | Freq: Every day | ORAL | Status: DC
  Administered 2012-05-21 – 2012-05-22 (×2): 1 via ORAL
  Filled 2012-05-21 (×2): qty 1

## 2012-05-21 MED ORDER — LABETALOL HCL 200 MG PO TABS
200.0000 mg | ORAL_TABLET | Freq: Once | ORAL | Status: AC
  Administered 2012-05-21: 200 mg via ORAL
  Filled 2012-05-21: qty 1

## 2012-05-21 MED ORDER — ACETAMINOPHEN 325 MG PO TABS
650.0000 mg | ORAL_TABLET | ORAL | Status: DC | PRN
  Administered 2012-05-21 – 2012-05-22 (×2): 650 mg via ORAL
  Filled 2012-05-21 (×2): qty 2

## 2012-05-21 MED ORDER — DOCUSATE SODIUM 100 MG PO CAPS
100.0000 mg | ORAL_CAPSULE | Freq: Every day | ORAL | Status: DC
  Administered 2012-05-21 – 2012-05-22 (×2): 100 mg via ORAL
  Filled 2012-05-21 (×2): qty 1

## 2012-05-21 MED ORDER — CALCIUM CARBONATE ANTACID 500 MG PO CHEW: 2.0000 | CHEWABLE_TABLET | ORAL | Status: DC | PRN

## 2012-05-21 NOTE — Progress Notes (Signed)
Pt being evaluated for pre ecclampsia

## 2012-05-21 NOTE — MAU Note (Signed)
Patient states she was sent from the office for evaluation of elevated blood pressure and to turn in a 24 hour urine. Patient states she has been having headaches, blurred vision and seeing spots. Has had some mild cramping off and on. Had a positive fetal fibronectin that was collected yesterday and resulted today.

## 2012-05-21 NOTE — H&P (Signed)
Allison [name] is a 26 y.o. female presenting for +FFN, with c/o of mild ha x 2 days at base of neck, 1 episode of star with repositionin 8/13 tender middle of abdomen, swelling to hands only, No contractions, srom, or vag bleeding, with +FM. History OB History    Grav Para Term Preterm Abortions TAB SAB Ect Mult Living   1              Past Medical History  Diagnosis Date  . Asthma   . Allergy   . Hyperlipidemia   . UTI (lower urinary tract infection)   . Migraine   . Chicken pox   . Smoker   . Depression     In counseling with Dr. Ledon Snare in [place]  . Seizures    Past Surgical History  Procedure Date  . Tonsillectomy   . Wisdom tooth extraction 2007   Family History: family history includes Alcohol abuse in her brother; Cancer in her maternal grandfather; Depression in her mother and other; Heart disease in her paternal aunt; Hypertension in her father, mother, and paternal grandmother; Hypothyroidism in her brother, maternal grandmother, and mother; and Mental illness in her sister. Social History:  reports that she quit smoking about 6 months ago. Her smoking use included Cigarettes. She has a 8 pack-year smoking history. She has never used smokeless tobacco. She reports that she drinks about .5 ounces of alcohol per week. She reports that she uses illicit drugs (Marijuana) about once per week. MEDS: celexa 40 mg daily PNV  Prenatal Transfer Tool  Maternal Diabetes: No Genetic Screening: Declined Maternal Ultrasounds/Referrals: Normal Fetal Ultrasounds or other Referrals:  None Maternal Substance Abuse:  No Significant Maternal Medications:  Meds include: Other: see prenatal record Significant Maternal Lab Results:  None Other Comments:  on celexa  ROS    Blood pressure 144/85, pulse 102, temperature 98.4 F (36.9 C), temperature source Oral, resp. rate 20, height 5\' 7"  (1.702 m), weight 296 lb (134.265 kg), last menstrual period 09/15/2011. Exam Physical  Exam Calm, no distress, HEENT WNL grossly, lungs clear bilaterally, AP RRR, abd soft tender midline uterus not R or Lupper quadrants,  not tympanic bowel sounds active, no edema lower legs, DTRS +2 bilaterally, no clonus Prenatal labs: ABO, Rh: A/Positive/-- (03/06 0000) Antibody: Negative (03/06 0000) Rubella: Immune (03/06 0000) RPR: NON REAC (07/16 1737)  HBsAg: Negative (03/06 0000)  HIV: Non-reactive (03/06 0000)  GBS:   not done  Assessment/Plan: [redacted]w[redacted]d Hypertension +FFN P admit to observation, PIH labs pending 24 hour urine to lab already collected at home, labetalol 200 mg bid collaboration with Dr. Normand Sloop, pt concurs with plan of care.   Allison [name] 05/21/2012, 7:23 PM

## 2012-05-21 NOTE — Progress Notes (Signed)
Pt states she had a headache earlier but is not longer having or headache or any other pain

## 2012-05-21 NOTE — MAU Note (Signed)
Pt states she had blurred vision with spots last night and a headache this morning, Pt ws seen in the office and sent to MAU to further evaluation

## 2012-05-22 ENCOUNTER — Observation Stay (HOSPITAL_COMMUNITY): Payer: Medicaid Other

## 2012-05-22 ENCOUNTER — Inpatient Hospital Stay (HOSPITAL_COMMUNITY): Payer: Medicaid Other

## 2012-05-22 ENCOUNTER — Encounter (HOSPITAL_COMMUNITY): Payer: Self-pay | Admitting: *Deleted

## 2012-05-22 DIAGNOSIS — O4700 False labor before 37 completed weeks of gestation, unspecified trimester: Secondary | ICD-10-CM

## 2012-05-22 DIAGNOSIS — O139 Gestational [pregnancy-induced] hypertension without significant proteinuria, unspecified trimester: Secondary | ICD-10-CM

## 2012-05-22 LAB — TYPE AND SCREEN
ABO/RH(D): A POS
Antibody Screen: NEGATIVE

## 2012-05-22 LAB — ABO/RH: ABO/RH(D): A POS

## 2012-05-22 MED ORDER — METHYLDOPA 500 MG PO TABS
500.0000 mg | ORAL_TABLET | Freq: Two times a day (BID) | ORAL | Status: DC
  Administered 2012-05-22 – 2012-05-23 (×2): 500 mg via ORAL
  Filled 2012-05-22 (×4): qty 1

## 2012-05-22 MED ORDER — OXYCODONE-ACETAMINOPHEN 5-325 MG PO TABS: 1.0000 | ORAL_TABLET | ORAL | Status: DC | PRN

## 2012-05-22 MED ORDER — ACETAMINOPHEN-CODEINE #4 300-60 MG PO TABS: 1.0000 | ORAL_TABLET | ORAL | Status: DC | PRN

## 2012-05-22 MED ORDER — OXYCODONE HCL 5 MG PO TABS
5.0000 mg | ORAL_TABLET | Freq: Once | ORAL | Status: AC
  Administered 2012-05-22: 5 mg via ORAL
  Filled 2012-05-22: qty 1

## 2012-05-22 NOTE — Progress Notes (Addendum)
Allison [name] is a 26 y.o. G1P0000 at [redacted]w[redacted]d  Patient Active Problem List  Diagnosis  . Depression  . Smoker  . Migraine  . Obesity  . HLD (hyperlipidemia)  . Drug abuse    Subjective: denies srom, vag bleeding, uc, with +FM denies no upper abdominal pain, no swelling hands, face or ankles. With c/o of " headache and tingling to head, some dizziness, and nausea since labetalol.    Objective: BP 130/79  Pulse 96  Temp 97.8 F (36.6 C) (Oral)  Resp 20  Ht 5\' 7"  (1.702 m)  Wt 296 lb (134.265 kg)  BMI 46.36 kg/m2  LMP 09/15/2011      Physical Exam:  Gen: alert, calm, no distress Chest/Lungs: cta bilaterally  Heart/Pulse: RRR  Abdomen: soft, gravid, nontender, BX x4 quad Uterine fundus: soft, nontender Skin & Color: warm and dry  Neurological: AOx3, DTRs +2 bilaterally, no clonus  EXT: negative Homan's b/l, edema none Fhts catergory 1 UC without  Vag not assessed             Labs: Results for orders placed during the hospital encounter of 05/21/12 (from the past 48 hour(s))  CREATININE CLEARANCE, URINE, 24 HOUR     Status: Abnormal   Collection Time   05/20/12  4:32 PM      Component Value Range Comment   Urine Total Volume-CRCL 1040      Collection Interval-CRCL 24      Creatinine, Urine 157.18      Creatinine 0.70  0.50 - 1.10 mg/dL    Creatinine, 16X Ur 0960  700 - 1800 mg/day    Creatinine Clearance 162 (*) 75 - 115 mL/min   PROTEIN, URINE, 24 HOUR     Status: Normal   Collection Time   05/20/12  4:37 PM      Component Value Range Comment   Urine Total Volume-UPROT 1040      Collection Interval-UPROT 24      Protein, Urine 9      Protein, 24H Urine 94  50 - 100 mg/day   COMPREHENSIVE METABOLIC PANEL     Status: Abnormal   Collection Time   05/21/12  5:17 PM      Component Value Range Comment   Sodium 135  135 - 145 mEq/L    Potassium 4.0  3.5 - 5.1 mEq/L    Chloride 101  96 - 112 mEq/L    CO2 21  19 - 32 mEq/L    Glucose, Bld 79  70 - 99  mg/dL    BUN 10  6 - 23 mg/dL    Creatinine, Ser 4.54 (*) 0.50 - 1.10 mg/dL    Calcium 9.5  8.4 - 09.8 mg/dL    Total Protein 6.4  6.0 - 8.3 g/dL    Albumin 3.0 (*) 3.5 - 5.2 g/dL    AST 31  0 - 37 U/L    ALT 16  0 - 35 U/L    Alkaline Phosphatase 81  39 - 117 U/L    Total Bilirubin 0.2 (*) 0.3 - 1.2 mg/dL    GFR calc non Af Amer >90  >90 mL/min    GFR calc Af Amer >90  >90 mL/min   URIC ACID     Status: Normal   Collection Time   05/21/12  5:17 PM      Component Value Range Comment   Uric Acid, Serum 6.2  2.4 - 7.0 mg/dL   CBC WITH DIFFERENTIAL  Status: Abnormal   Collection Time   05/21/12  5:17 PM      Component Value Range Comment   WBC 10.9 (*) 4.0 - 10.5 K/uL    RBC 4.29  3.87 - 5.11 MIL/uL    Hemoglobin 12.7  12.0 - 15.0 g/dL    HCT 45.4  09.8 - 11.9 %    MCV 88.8  78.0 - 100.0 fL    MCH 29.6  26.0 - 34.0 pg    MCHC 33.3  30.0 - 36.0 g/dL    RDW 14.7  82.9 - 56.2 %    Platelets 163  150 - 400 K/uL    Neutrophils Relative 74  43 - 77 %    Neutro Abs 8.1 (*) 1.7 - 7.7 K/uL    Lymphocytes Relative 20  12 - 46 %    Lymphs Abs 2.1  0.7 - 4.0 K/uL    Monocytes Relative 5  3 - 12 %    Monocytes Absolute 0.6  0.1 - 1.0 K/uL    Eosinophils Relative 1  0 - 5 %    Eosinophils Absolute 0.1  0.0 - 0.7 K/uL    Basophils Relative 0  0 - 1 %    Basophils Absolute 0.0  0.0 - 0.1 K/uL   LACTATE DEHYDROGENASE     Status: Normal   Collection Time   05/21/12  5:17 PM      Component Value Range Comment   LDH 131  94 - 250 U/L   TYPE AND SCREEN     Status: Normal   Collection Time   05/22/12  7:00 AM      Component Value Range Comment   ABO/RH(D) A POS      Antibody Screen NEG      Sample Expiration 05/25/2012     ABO/RH     Status: Normal   Collection Time   05/22/12  7:00 AM      Component Value Range Comment   ABO/RH(D) A POS       BP 130/79  Pulse 96  Temp 97.8 F (36.6 C) (Oral)  Resp 20  Ht 5\' 7"  (1.702 m)  Wt 296 lb (134.265 kg)  BMI 46.36 kg/m2  LMP  09/15/2011 Assessment and Plan: [redacted]w[redacted]d GHTN has Depression; Smoker; Migraine; Obesity; HLD (hyperlipidemia); and Drug abuse on her problem list. Discussed with Dr. Su Hilt will change BP med to aldomet 500 bid starting with 2200 dose. Continue care. Lavera Guise, CNM 05/22/2012, 1:37 PM  Agree with above.  Cont to observe.

## 2012-05-22 NOTE — Progress Notes (Signed)
UR Chart review completed.  

## 2012-05-23 ENCOUNTER — Encounter (HOSPITAL_COMMUNITY): Payer: Self-pay | Admitting: Obstetrics and Gynecology

## 2012-05-23 DIAGNOSIS — O4700 False labor before 37 completed weeks of gestation, unspecified trimester: Secondary | ICD-10-CM

## 2012-05-23 DIAGNOSIS — O10019 Pre-existing essential hypertension complicating pregnancy, unspecified trimester: Secondary | ICD-10-CM

## 2012-05-23 DIAGNOSIS — O139 Gestational [pregnancy-induced] hypertension without significant proteinuria, unspecified trimester: Secondary | ICD-10-CM | POA: Diagnosis present

## 2012-05-23 MED ORDER — METHYLDOPA 500 MG PO TABS: 500.0000 mg | ORAL_TABLET | Freq: Three times a day (TID) | ORAL | Status: DC

## 2012-05-23 NOTE — Progress Notes (Signed)
Hospital day # 2 pregnancy at [redacted]w[redacted]d  S: well, much better than yesterday. Denies headache or scotoma. No epigastric pain.      reports good fetal activity      Contractions:only a few last night      Vaginal bleeding:none now       Vaginal discharge: no significant change  O: BP 150/77  Pulse 91  Temp 98.2 F (36.8 C) (Oral)  Resp 20  Ht 5\' 7"  (1.702 m)  Wt 296 lb (134.265 kg)  BMI 46.36 kg/m2  LMP 09/15/2011  BP 130-145/ 65-80      Fetal tracings:    Fetal heart variability: moderate    Fetal Heart Rate decelerations: occasional variable, isolated    Fetal Heart Rate accelerations: yes    Baseline FHR: 130-140 per minute    reviewed and reassuring      Uterus gravid and non-tender      Extremities: no significant edema and no signs of DVT      VE: closed about 50% effaced and very posterior. balottable  A: [redacted]w[redacted]d with PIH without evidence of pre-eclampsia     + FFN now s/p Betamethasone     stable  P: D/C home on bed rest      Increase Aldomet to 500 mg TID      PIH warning given      Will plan 2 x weekly NST and 1 x weekly ROB/24 hour urine      Repeat FFN in 2 weeks  Allison [name] A  MD 05/23/2012 10:52 AM

## 2012-05-23 NOTE — Discharge Summary (Signed)
  ANTENATAL DISCHARGE SUMMARY  Patient ID: Allison [name] MRN: [id number] DOB/AGE: 26/10/1985 26 y.o.  Admit date: 05/21/2012 Discharge date: 05/23/2012  Admission Diagnoses: 31 weeks with PIH and + FFN  Discharge Diagnoses: 31 weeks with PIH and + FFN         Discharged Condition: good  Hospital Course: Betamethasone 8/14 and 05/22/12, Labetalol poorly tolerated changed to Aldomet on 05/22/12                                No evidence of pre-eclampsia with normal 24 hour urine  Consults: None  Treatments: see hospital course  Disposition: home   Medication List  As of 05/23/2012  3:19 PM   TAKE these medications         citalopram 40 MG tablet   Commonly known as: CELEXA   Take 40 mg by mouth at bedtime.      methyldopa 500 MG tablet   Commonly known as: ALDOMET   Take 1 tablet (500 mg total) by mouth 3 (three) times daily.      prenatal multivitamin Tabs   Take 1 tablet by mouth at bedtime.             Signed: Esmeralda Arthur, MD MD 05/23/2012, 3:19 PM

## 2012-05-27 ENCOUNTER — Other Ambulatory Visit: Payer: Self-pay

## 2012-05-27 ENCOUNTER — Other Ambulatory Visit: Payer: Medicaid Other

## 2012-05-27 ENCOUNTER — Encounter: Payer: Self-pay | Admitting: Obstetrics and Gynecology

## 2012-05-27 ENCOUNTER — Ambulatory Visit (INDEPENDENT_AMBULATORY_CARE_PROVIDER_SITE_OTHER): Payer: Medicaid Other | Admitting: Obstetrics and Gynecology

## 2012-05-27 VITALS — BP 130/80 | Wt 297.0 lb

## 2012-05-27 DIAGNOSIS — IMO0002 Reserved for concepts with insufficient information to code with codable children: Secondary | ICD-10-CM

## 2012-05-27 DIAGNOSIS — O139 Gestational [pregnancy-induced] hypertension without significant proteinuria, unspecified trimester: Secondary | ICD-10-CM

## 2012-05-27 NOTE — Addendum Note (Signed)
Addended by: Janeece Agee on: 05/27/2012 03:32 PM   Modules accepted: Orders

## 2012-05-27 NOTE — Progress Notes (Signed)
Pt was worked up by [employer], CMA she was unable to document b/c at the time NO EPISODE was created. Pt states she lost mucus plug Pt c/o headaches.

## 2012-05-27 NOTE — Progress Notes (Signed)
NST reactive, no decels. No contractions on monitor. Returned 24 hour urine today.--will repeat q weeks per Dr. Estanislado Pandy plan.  Last 24 hour urine protein = 94 on 8/13. NSTs Tuesday and Friday, with ROB weekly on Tuesday. Plan repeat FFN NV per SR. Mild HA yesterday, occasional contractions. S/S PTL reviewed. S/P +FFN on 8/13, with betamethasone given at [facility]. Continues on Aldoment 500 mg po TID.

## 2012-05-27 NOTE — Addendum Note (Signed)
Addended by: Janeece Agee on: 05/27/2012 05:09 PM   Modules accepted: Orders

## 2012-05-28 ENCOUNTER — Telehealth: Payer: Self-pay | Admitting: Obstetrics and Gynecology

## 2012-05-28 LAB — CREATININE, URINE, 24 HOUR: Creatinine, 24H Ur: 1643 mg/d (ref 700–1800)

## 2012-05-28 LAB — PROTEIN, URINE, 24 HOUR
Protein, 24H Urine: 83 mg/d (ref 50–100)
Protein, Urine: 5 mg/dL

## 2012-05-28 NOTE — Telephone Encounter (Signed)
Spoke with pt rgs msg. Pt wanted her 24 hour urine test results. tols pt that her 24 hour urine was wnl. Pt's voice understanding . bt cma

## 2012-05-29 ENCOUNTER — Telehealth: Payer: Self-pay

## 2012-05-29 NOTE — Telephone Encounter (Signed)
Spoke with pt informing her 24 urine WNL per VL. Pt agrees and voices understanding.

## 2012-05-29 NOTE — Telephone Encounter (Signed)
Message copied by Janeece Agee on Thu May 29, 2012 12:50 PM ------      Message from: Cornelius Moras      Created: Wed May 28, 2012  5:11 PM       Call patient and inform of 24 hour urine = WNL.

## 2012-05-29 NOTE — Telephone Encounter (Signed)
LM regarding lab results  

## 2012-05-29 NOTE — Telephone Encounter (Signed)
Message copied by Janeece Agee on Thu May 29, 2012  9:27 AM ------      Message from: Cornelius Moras      Created: Wed May 28, 2012  5:11 PM       Call patient and inform of 24 hour urine = WNL.

## 2012-05-30 ENCOUNTER — Ambulatory Visit (INDEPENDENT_AMBULATORY_CARE_PROVIDER_SITE_OTHER): Payer: Medicaid Other

## 2012-05-30 ENCOUNTER — Ambulatory Visit (INDEPENDENT_AMBULATORY_CARE_PROVIDER_SITE_OTHER): Payer: Medicaid Other | Admitting: Obstetrics and Gynecology

## 2012-05-30 ENCOUNTER — Other Ambulatory Visit: Payer: Self-pay | Admitting: Obstetrics and Gynecology

## 2012-05-30 VITALS — BP 132/80

## 2012-05-30 DIAGNOSIS — IMO0002 Reserved for concepts with insufficient information to code with codable children: Secondary | ICD-10-CM

## 2012-05-30 DIAGNOSIS — O288 Other abnormal findings on antenatal screening of mother: Secondary | ICD-10-CM

## 2012-05-30 DIAGNOSIS — O139 Gestational [pregnancy-induced] hypertension without significant proteinuria, unspecified trimester: Secondary | ICD-10-CM

## 2012-05-30 DIAGNOSIS — O289 Unspecified abnormal findings on antenatal screening of mother: Secondary | ICD-10-CM

## 2012-05-30 NOTE — Progress Notes (Signed)
[redacted]w[redacted]d Here for NST only: non-reactive BPP: AFI normal, 8/8 HTN well controlled: continue Aldomet 500 mg TID

## 2012-06-02 ENCOUNTER — Encounter: Payer: Medicaid Other | Admitting: Obstetrics and Gynecology

## 2012-06-03 ENCOUNTER — Other Ambulatory Visit: Payer: Medicaid Other

## 2012-06-03 ENCOUNTER — Ambulatory Visit (INDEPENDENT_AMBULATORY_CARE_PROVIDER_SITE_OTHER): Payer: Medicaid Other | Admitting: Obstetrics and Gynecology

## 2012-06-03 ENCOUNTER — Encounter: Payer: Medicaid Other | Admitting: Obstetrics and Gynecology

## 2012-06-03 VITALS — BP 124/82 | Wt 296.0 lb

## 2012-06-03 DIAGNOSIS — I10 Essential (primary) hypertension: Secondary | ICD-10-CM

## 2012-06-03 DIAGNOSIS — O10019 Pre-existing essential hypertension complicating pregnancy, unspecified trimester: Secondary | ICD-10-CM

## 2012-06-03 NOTE — Progress Notes (Signed)
[redacted]w[redacted]d Nonstress test: Reactive. Doing well.  On modified bed rest here in [place]. Return to office in 3 days for nonstress test. Return to office in 1 week for biophysical profile. Dr. Stefano Gaul

## 2012-06-05 ENCOUNTER — Other Ambulatory Visit: Payer: Medicaid Other

## 2012-06-05 ENCOUNTER — Encounter: Payer: Medicaid Other | Admitting: Obstetrics and Gynecology

## 2012-06-06 ENCOUNTER — Encounter: Payer: Medicaid Other | Admitting: Obstetrics and Gynecology

## 2012-06-06 ENCOUNTER — Ambulatory Visit (INDEPENDENT_AMBULATORY_CARE_PROVIDER_SITE_OTHER): Payer: Medicaid Other | Admitting: Obstetrics and Gynecology

## 2012-06-06 VITALS — BP 120/80

## 2012-06-06 DIAGNOSIS — Z331 Pregnant state, incidental: Secondary | ICD-10-CM

## 2012-06-06 DIAGNOSIS — Z3490 Encounter for supervision of normal pregnancy, unspecified, unspecified trimester: Secondary | ICD-10-CM

## 2012-06-06 DIAGNOSIS — O10019 Pre-existing essential hypertension complicating pregnancy, unspecified trimester: Secondary | ICD-10-CM

## 2012-06-06 NOTE — Progress Notes (Signed)
[redacted]w[redacted]d Patient came to the office for due to [facility] NST  and NST reviewed was reviewed and was reactive with FM+ ROB as scheduled.

## 2012-06-06 NOTE — Progress Notes (Signed)
Pt was here for NST only. PG CMA

## 2012-06-12 ENCOUNTER — Other Ambulatory Visit: Payer: Medicaid Other

## 2012-06-13 ENCOUNTER — Ambulatory Visit (INDEPENDENT_AMBULATORY_CARE_PROVIDER_SITE_OTHER): Payer: Medicaid Other | Admitting: Obstetrics and Gynecology

## 2012-06-13 ENCOUNTER — Ambulatory Visit (INDEPENDENT_AMBULATORY_CARE_PROVIDER_SITE_OTHER): Payer: Medicaid Other

## 2012-06-13 ENCOUNTER — Encounter: Payer: Self-pay | Admitting: Obstetrics and Gynecology

## 2012-06-13 VITALS — BP 124/72 | Wt 299.0 lb

## 2012-06-13 DIAGNOSIS — O139 Gestational [pregnancy-induced] hypertension without significant proteinuria, unspecified trimester: Secondary | ICD-10-CM

## 2012-06-13 DIAGNOSIS — I10 Essential (primary) hypertension: Secondary | ICD-10-CM

## 2012-06-13 DIAGNOSIS — IMO0002 Reserved for concepts with insufficient information to code with codable children: Secondary | ICD-10-CM

## 2012-06-13 DIAGNOSIS — O10019 Pre-existing essential hypertension complicating pregnancy, unspecified trimester: Secondary | ICD-10-CM

## 2012-06-13 NOTE — Progress Notes (Signed)
[redacted]w[redacted]d GFM. BPP 8/8 PIH: Aldomet 500 TID . Reports daily headaches 5/10 improved with Tylenol. No scotoma. No epigastric pain. Minimal edema. Will repeat 24 hour urine

## 2012-06-13 NOTE — Progress Notes (Signed)
Sono:   AFI: 19.1 cm  CX: 2.53 cm   Vertex presentation.  Anterior placenta.  Normal fluid. AFI = 70th%tile.   BPP 8/8 in 11 min.   CX closed = 2.5cm.

## 2012-06-13 NOTE — Progress Notes (Signed)
Pt c/o headaches. Also c/o rash on neck, check, and thigh. Unable to void.

## 2012-06-19 ENCOUNTER — Ambulatory Visit (INDEPENDENT_AMBULATORY_CARE_PROVIDER_SITE_OTHER): Payer: Medicaid Other

## 2012-06-19 ENCOUNTER — Other Ambulatory Visit: Payer: Self-pay | Admitting: Obstetrics and Gynecology

## 2012-06-19 ENCOUNTER — Encounter: Payer: Self-pay | Admitting: Obstetrics and Gynecology

## 2012-06-19 ENCOUNTER — Ambulatory Visit (INDEPENDENT_AMBULATORY_CARE_PROVIDER_SITE_OTHER): Payer: Medicaid Other | Admitting: Obstetrics and Gynecology

## 2012-06-19 VITALS — BP 134/80 | Wt 301.5 lb

## 2012-06-19 DIAGNOSIS — O139 Gestational [pregnancy-induced] hypertension without significant proteinuria, unspecified trimester: Secondary | ICD-10-CM

## 2012-06-19 DIAGNOSIS — IMO0002 Reserved for concepts with insufficient information to code with codable children: Secondary | ICD-10-CM

## 2012-06-19 DIAGNOSIS — I10 Essential (primary) hypertension: Secondary | ICD-10-CM

## 2012-06-19 LAB — CBC
HCT: 40.3 % (ref 36.0–46.0)
Hemoglobin: 13.6 g/dL (ref 12.0–15.0)
MCH: 29.2 pg (ref 26.0–34.0)
MCHC: 33.7 g/dL (ref 30.0–36.0)
MCV: 86.7 fL (ref 78.0–100.0)

## 2012-06-19 LAB — COMPREHENSIVE METABOLIC PANEL
Alkaline Phosphatase: 92 U/L (ref 39–117)
BUN: 12 mg/dL (ref 6–23)
Creat: 0.5 mg/dL (ref 0.50–1.10)
Glucose, Bld: 70 mg/dL (ref 70–99)
Total Bilirubin: 0.3 mg/dL (ref 0.3–1.2)

## 2012-06-19 LAB — LACTATE DEHYDROGENASE: LDH: 144 U/L (ref 94–250)

## 2012-06-19 LAB — US OB FOLLOW UP

## 2012-06-19 LAB — URIC ACID: Uric Acid, Serum: 5.8 mg/dL (ref 2.4–7.0)

## 2012-06-19 NOTE — Progress Notes (Signed)
[redacted]w[redacted]d Doing well, denies any HA/N/V/RUQ pain  GHTN - On aldomet 500mg  TID, BP stable  Will drawn PIH labs and pt to turn in 24hr urine this week Trace edema RV'd [place] today, growth and AFI WNL, BPP 8/8  Cervix slightly shortened at 2.7cm VE - cervix is closed rv'd FKC and labor sx's  GBS NV and [place] for BPP RTO 1wk

## 2012-06-19 NOTE — Progress Notes (Signed)
[redacted]w[redacted]d Pt didn't complete 24 hr urine due to being with her mother in ER. HTN growth /bpp U/S today EFW 57th%tile AFI 65th%tile Anterior placenta cx closed measured translabially 2.7 cm

## 2012-06-19 NOTE — Patient Instructions (Signed)
Preeclampsia and Eclampsia Preeclampsia is a condition of high blood pressure during pregnancy. It can happen at 20 weeks or later in pregnancy. If high blood pressure occurs in the second half of pregnancy with no other symptoms, it is called gestational hypertension and goes away after the baby is born. If any of the symptoms listed below develop with gestational hypertension, it is then called preeclampsia. Eclampsia (convulsions) may follow preeclampsia. This is one of the reasons for regular prenatal checkups. Early diagnosis and treatment are very important to prevent eclampsia. CAUSES  There is no known cause of preeclampsia/eclampsia in pregnancy. There are several known conditions that may put the pregnant woman at risk, such as:  The first pregnancy.   Having preeclampsia in a past pregnancy.   Having lasting (chronic) high blood pressure.   Having multiples (twins, triplets).   Being age 47 or older.   African American ethnic background.   Having kidney disease or diabetes.   Medical conditions such as lupus or blood diseases.   Being overweight (obese).  SYMPTOMS   High blood pressure.   Headaches.   Sudden weight gain.   Swelling of hands, face, legs, and feet.   Protein in the urine.   Feeling sick to your stomach (nauseous) and throwing up (vomiting).   Vision problems (blurred or double vision).   Numbness in the face, arms, legs, and feet.   Dizziness.   Slurred speech.   Preeclampsia can cause growth retardation in the fetus.   Separation (abruption) of the placenta.   Not enough fluid in the amniotic sac (oligohydramnios).   Sensitivity to bright lights.   Belly (abdominal) pain.  DIAGNOSIS  If protein is found in the urine in the second half of pregnancy, this is considered preeclampsia. Other symptoms mentioned above may also be present. TREATMENT  It is necessary to treat this.  Your caregiver may prescribe bed rest early in this  condition. Plenty of rest and salt restriction may be all that is needed.   Medicines may be necessary to lower blood pressure if the condition does not respond to more conservative measures.   In more severe cases, hospitalization may be needed:   For treatment of blood pressure.   To control fluid retention.   To monitor the baby to see if the condition is causing harm to the baby.   Hospitalization is the best way to treat the first sign of preeclampsia. This is so the mother and baby can be watched closely and blood tests can be done effectively and correctly.   If the condition becomes severe, it may be necessary to induce labor or to remove the infant by surgical means (cesarean section). The best cure for preeclampsia/eclampsia is to deliver the baby.  Preeclampsia and eclampsia involve risks to mother and infant. Your caregiver will discuss these risks with you. Together, you can work out the best possible approach to your problems. Make sure you keep your prenatal visits as scheduled. Not keeping appointments could result in a chronic or permanent injury, pain, disability to you, and death or injury to you or your unborn baby. If there is any problem keeping the appointment, you must call to reschedule. HOME CARE INSTRUCTIONS   Keep your prenatal appointments and tests as scheduled.   Tell your caregiver if you have any of the above risk factors.   Get plenty of rest and sleep.   Eat a balanced diet that is low in salt, and do not add salt  to your food.   Avoid stressful situations.   Only take over-the-counter and prescriptions medicines for pain, discomfort, or fever as directed by your caregiver.  SEEK IMMEDIATE MEDICAL CARE IF:   You develop severe swelling anywhere in the body. This usually occurs in the legs.   You gain 5 lb/2.3 kg or more in a week.   You develop a severe headache, dizziness, problems with your vision, or confusion.   You have abdominal pain,  nausea, or vomiting.   You have a seizure.   You have trouble moving any part of your body, or you develop numbness or problems speaking.   You have bruising or abnormal bleeding from anywhere in the body.   You develop a stiff neck.   You pass out.  MAKE SURE YOU:   Understand these instructions.   Will watch your condition.   Will get help right away if you are not doing well or get worse.  Document Released: 09/21/2000 Document Revised: 09/13/2011 Document Reviewed: 05/07/2008 [facility] Patient Information 2012 [place], [place].Fetal Movement Counts Patient Name: __________________________________________________ Patient Due Date: ____________________ Allison [name] counts is highly recommended in high risk pregnancies, but it is a good idea for every pregnant woman to do. Start counting fetal movements at 28 weeks of the pregnancy. Fetal movements increase after eating a full meal or eating or drinking something sweet (the blood sugar is higher). It is also important to drink plenty of fluids (well hydrated) before doing the count. Lie on your left side because it helps with the circulation or you can sit in a comfortable chair with your arms over your belly (abdomen) with no distractions around you. DOING THE COUNT  Try to do the count the same time of day each time you do it.   Mark the day and time, then see how long it takes for you to feel 10 movements (kicks, flutters, swishes, rolls). You should have at least 10 movements within 2 hours. You will most likely feel 10 movements in much less than 2 hours. If you do not, wait an hour and count again. After a couple of days you will see a pattern.   What you are looking for is a change in the pattern or not enough counts in 2 hours. Is it taking longer in time to reach 10 movements?  SEEK MEDICAL CARE IF:  You feel less than 10 counts in 2 hours. Tried twice.   No movement in one hour.   The pattern is changing or taking longer each day  to reach 10 counts in 2 hours.   You feel the baby is not moving as it usually does.  Date: ____________ Movements: ____________ Start time: ____________ [name] time: ____________  Date: ____________ Movements: ____________ Start time: ____________ [name] time: ____________ Date: ____________ Movements: ____________ Start time: ____________ [name] time: ____________ Date: ____________ Movements: ____________ Start time: ____________ [name] time: ____________ Date: ____________ Movements: ____________ Start time: ____________ [name] time: ____________ Date: ____________ Movements: ____________ Start time: ____________ [name] time: ____________ Date: ____________ Movements: ____________ Start time: ____________ [name] time: ____________ Date: ____________ Movements: ____________ Start time: ____________ [name] time: ____________  Date: ____________ Movements: ____________ Start time: ____________ [name] time: ____________ Date: ____________ Movements: ____________ Start time: ____________ [name] time: ____________ Date: ____________ Movements: ____________ Start time: ____________ [name] time: ____________ Date: ____________ Movements: ____________ Start time: ____________ [name] time: ____________ Date: ____________ Movements: ____________ Start time: ____________ [name] time: ____________ Date: ____________ Movements: ____________ Start time: ____________ [name] time: ____________ Date:  ____________ Movements: ____________ Start time: ____________ [name] time: ____________  Date: ____________ Movements: ____________ Start time: ____________ [name] time: ____________ Date: ____________ Movements: ____________ Start time: ____________ [name] time: ____________ Date: ____________ Movements: ____________ Start time: ____________ [name] time: ____________ Date: ____________ Movements: ____________ Start time: ____________ [name] time: ____________ Date: ____________ Movements: ____________ Start  time: ____________ [name] time: ____________ Date: ____________ Movements: ____________ Start time: ____________ [name] time: ____________ Date: ____________ Movements: ____________ Start time: ____________ [name] time: ____________  Date: ____________ Movements: ____________ Start time: ____________ [name] time: ____________ Date: ____________ Movements: ____________ Start time: ____________ [name] time: ____________ Date: ____________ Movements: ____________ Start time: ____________ [name] time: ____________ Date: ____________ Movements: ____________ Start time: ____________ [name] time: ____________ Date: ____________ Movements: ____________ Start time: ____________ [name] time: ____________ Date: ____________ Movements: ____________ Start time: ____________ [name] time: ____________ Date: ____________ Movements: ____________ Start time: ____________ [name] time: ____________  Date: ____________ Movements: ____________ Start time: ____________ [name] time: ____________ Date: ____________ Movements: ____________ Start time: ____________ [name] time: ____________ Date: ____________ Movements: ____________ Start time: ____________ [name] time: ____________ Date: ____________ Movements: ____________ Start time: ____________ [name] time: ____________ Date: ____________ Movements: ____________ Start time: ____________ [name] time: ____________ Date: ____________ Movements: ____________ Start time: ____________ [name] time: ____________ Date: ____________ Movements: ____________ Start time: ____________ [name] time: ____________  Date: ____________ Movements: ____________ Start time: ____________ [name] time: ____________ Date: ____________ Movements: ____________ Start time: ____________ [name] time: ____________ Date: ____________ Movements: ____________ Start time: ____________ [name] time: ____________ Date: ____________ Movements: ____________ Start time: ____________ [name] time:  ____________ Date: ____________ Movements: ____________ Start time: ____________ [name] time: ____________ Date: ____________ Movements: ____________ Start time: ____________ [name] time: ____________ Date: ____________ Movements: ____________ Start time: ____________ [name] time: ____________  Date: ____________ Movements: ____________ Start time: ____________ [name] time: ____________ Date: ____________ Movements: ____________ Start time: ____________ [name] time: ____________ Date: ____________ Movements: ____________ Start time: ____________ [name] time: ____________ Date: ____________ Movements: ____________ Start time: ____________ [name] time: ____________ Date: ____________ Movements: ____________ Start time: ____________ [name] time: ____________ Date: ____________ Movements: ____________ Start time: ____________ [name] time: ____________ Date: ____________ Movements: ____________ Start time: ____________ [name] time: ____________  Date: ____________ Movements: ____________ Start time: ____________ [name] time: ____________ Date: ____________ Movements: ____________ Start time: ____________ [name] time: ____________ Date: ____________ Movements: ____________ Start time: ____________ [name] time: ____________ Date: ____________ Movements: ____________ Start time: ____________ [name] time: ____________ Date: ____________ Movements: ____________ Start time: ____________ [name] time: ____________ Date: ____________ Movements: ____________ Start time: ____________ [name] time: ____________ Document Released: 10/24/2006 Document Revised: 09/13/2011 Document Reviewed: 04/26/2009 [employer] Patient Information 2012 [employer].Normal Labor and Delivery Your caregiver must first be sure you are in labor. Signs of labor include:  You may pass what is called "the mucus plug" before labor begins. This is a small amount of blood stained mucus.   Regular uterine contractions.   The time between  contractions get closer together.   The discomfort and pain gradually gets more intense.   Pains are mostly located in the back.   Pains get worse when walking.   The cervix (the opening of the uterus becomes thinner (begins to efface) and opens up (dilates).  Once you are in labor and admitted into the hospital or care center, your caregiver will do the following:  A complete physical examination.   Check your vital signs (blood pressure, pulse, temperature and the fetal heart rate).   Do a vaginal examination (using a sterile glove and lubricant) to determine:   The position (presentation) of  the baby (head [vertex] or buttock first).   The level (station) of the baby's head in the birth canal.   The effacement and dilatation of the cervix.   You may have your pubic hair shaved and be given an enema depending on your caregiver and the circumstance.   An electronic monitor is usually placed on your abdomen. The monitor follows the length and intensity of the contractions, as well as the baby's heart rate.   Usually, your caregiver will insert an IV in your arm with a bottle of sugar water. This is done as a precaution so that medications can be given to you quickly during labor or delivery.  NORMAL LABOR AND DELIVERY IS DIVIDED UP INTO 3 STAGES: First Stage This is when regular contractions begin and the cervix begins to efface and dilate. This stage can last from 3 to 15 hours. The end of the first stage is when the cervix is 100% effaced and 10 centimeters dilated. Pain medications may be given by   Injection (morphine, demerol, etc.)   Regional anesthesia (spinal, caudal or epidural, anesthetics given in different locations of the spine). Paracervical pain medication may be given, which is an injection of and anesthetic on each side of the cervix.  A pregnant woman may request to have "Natural Childbirth" which is not to have any medications or anesthesia during her labor and  delivery. Second Stage This is when the baby comes down through the birth canal (vagina) and is born. This can take 1 to 4 hours. As the baby's head comes down through the birth canal, you may feel like you are going to have a bowel movement. You will get the urge to bear down and push until the baby is delivered. As the baby's head is being delivered, the caregiver will decide if an episiotomy (a cut in the perineum and vagina area) is needed to prevent tearing of the tissue in this area. The episiotomy is sewn up after the delivery of the baby and placenta. Sometimes a mask with nitrous oxide is given for the mother to breath during the delivery of the baby to help if there is too much pain. The end of Stage 2 is when the baby is fully delivered. Then when the umbilical cord stops pulsating it is clamped and cut. Third Stage The third stage begins after the baby is completely delivered and ends after the placenta (afterbirth) is delivered. This usually takes 5 to 30 minutes. After the placenta is delivered, a medication is given either by intravenous or injection to help contract the uterus and prevent bleeding. The third stage is not painful and pain medication is usually not necessary. If an episiotomy was done, it is repaired at this time. After the delivery, the mother is watched and monitored closely for 1 to 2 hours to make sure there is no postpartum bleeding (hemorrhage). If there is a lot of bleeding, medication is given to contract the uterus and stop the bleeding. Document Released: 07/03/2008 Document Revised: 09/13/2011 Document Reviewed: 07/03/2008 [facility] Patient Information 2012 [place], [place].

## 2012-06-20 ENCOUNTER — Other Ambulatory Visit: Payer: Medicaid Other

## 2012-06-20 ENCOUNTER — Other Ambulatory Visit: Payer: Self-pay | Admitting: Obstetrics and Gynecology

## 2012-06-20 ENCOUNTER — Other Ambulatory Visit: Payer: Self-pay

## 2012-06-20 DIAGNOSIS — O139 Gestational [pregnancy-induced] hypertension without significant proteinuria, unspecified trimester: Secondary | ICD-10-CM

## 2012-06-20 LAB — PROTEIN, URINE, 24 HOUR: Protein, Urine: 10 mg/dL

## 2012-06-20 LAB — CREATININE, URINE, 24 HOUR
Creatinine, 24H Ur: 2153 mg/d — ABNORMAL HIGH (ref 700–1800)
Creatinine, Urine: 153.8 mg/dL

## 2012-06-20 NOTE — Addendum Note (Signed)
Addended by: Stephens Shire on: 06/20/2012 03:35 PM   Modules accepted: Orders

## 2012-06-23 ENCOUNTER — Telehealth: Payer: Self-pay | Admitting: Obstetrics and Gynecology

## 2012-06-23 NOTE — Telephone Encounter (Signed)
Triage/tst res

## 2012-06-23 NOTE — Telephone Encounter (Signed)
Spoke with pt rgd labs informed per [facility] CNM labs look ok no further testing needed pt voice understanding

## 2012-06-26 ENCOUNTER — Other Ambulatory Visit: Payer: Medicaid Other

## 2012-06-26 ENCOUNTER — Encounter: Payer: Medicaid Other | Admitting: Obstetrics and Gynecology

## 2012-06-26 ENCOUNTER — Other Ambulatory Visit: Payer: Self-pay

## 2012-06-26 DIAGNOSIS — O139 Gestational [pregnancy-induced] hypertension without significant proteinuria, unspecified trimester: Secondary | ICD-10-CM

## 2012-07-02 ENCOUNTER — Ambulatory Visit (INDEPENDENT_AMBULATORY_CARE_PROVIDER_SITE_OTHER): Payer: Medicaid Other | Admitting: Obstetrics and Gynecology

## 2012-07-02 ENCOUNTER — Other Ambulatory Visit: Payer: Medicaid Other

## 2012-07-02 ENCOUNTER — Ambulatory Visit (INDEPENDENT_AMBULATORY_CARE_PROVIDER_SITE_OTHER): Payer: Medicaid Other

## 2012-07-02 ENCOUNTER — Inpatient Hospital Stay (HOSPITAL_COMMUNITY)
Admission: AD | Admit: 2012-07-02 | Discharge: 2012-07-06 | DRG: 766 | Disposition: A | Discharge status: Home / Self Care | Payer: Medicaid Other | Source: Intra-hospital | Attending: Obstetrics and Gynecology | Admitting: Obstetrics and Gynecology

## 2012-07-02 ENCOUNTER — Encounter (HOSPITAL_COMMUNITY): Payer: Self-pay

## 2012-07-02 ENCOUNTER — Other Ambulatory Visit: Payer: Self-pay | Admitting: Obstetrics and Gynecology

## 2012-07-02 VITALS — BP 158/96 | Wt 304.0 lb

## 2012-07-02 DIAGNOSIS — E669 Obesity, unspecified: Secondary | ICD-10-CM | POA: Diagnosis present

## 2012-07-02 DIAGNOSIS — Z98891 History of uterine scar from previous surgery: Secondary | ICD-10-CM

## 2012-07-02 DIAGNOSIS — F17200 Nicotine dependence, unspecified, uncomplicated: Secondary | ICD-10-CM | POA: Diagnosis present

## 2012-07-02 DIAGNOSIS — O139 Gestational [pregnancy-induced] hypertension without significant proteinuria, unspecified trimester: Secondary | ICD-10-CM

## 2012-07-02 DIAGNOSIS — Z331 Pregnant state, incidental: Secondary | ICD-10-CM

## 2012-07-02 DIAGNOSIS — IMO0002 Reserved for concepts with insufficient information to code with codable children: Secondary | ICD-10-CM

## 2012-07-02 DIAGNOSIS — F329 Major depressive disorder, single episode, unspecified: Secondary | ICD-10-CM | POA: Diagnosis present

## 2012-07-02 LAB — URINALYSIS, ROUTINE W REFLEX MICROSCOPIC
Bilirubin Urine: NEGATIVE
Ketones, ur: NEGATIVE mg/dL
Nitrite: NEGATIVE
pH: 6 (ref 5.0–8.0)

## 2012-07-02 LAB — US OB FOLLOW UP

## 2012-07-02 LAB — CBC
HCT: 41.7 % (ref 36.0–46.0)
Hemoglobin: 14 g/dL (ref 12.0–15.0)
MCV: 87.6 fL (ref 78.0–100.0)
Platelets: 153 10*3/uL (ref 150–400)
RBC: 4.76 MIL/uL (ref 3.87–5.11)
WBC: 11 10*3/uL — ABNORMAL HIGH (ref 4.0–10.5)

## 2012-07-02 LAB — COMPREHENSIVE METABOLIC PANEL
AST: 40 U/L — ABNORMAL HIGH (ref 0–37)
BUN: 9 mg/dL (ref 6–23)
CO2: 21 mEq/L (ref 19–32)
Chloride: 99 mEq/L (ref 96–112)
Creatinine, Ser: 0.53 mg/dL (ref 0.50–1.10)
GFR calc non Af Amer: >90 mL/min (ref >90)
Glucose, Bld: 78 mg/dL (ref 70–99)
Total Bilirubin: 0.3 mg/dL (ref 0.3–1.2)

## 2012-07-02 LAB — GROUP B STREP BY PCR: Group B strep by PCR: NEGATIVE

## 2012-07-02 LAB — URIC ACID: Uric Acid, Serum: 6.2 mg/dL (ref 2.4–7.0)

## 2012-07-02 LAB — URINE MICROSCOPIC-ADD ON

## 2012-07-02 LAB — RAPID URINE DRUG SCREEN, HOSP PERFORMED
Amphetamines: NOT DETECTED
Tetrahydrocannabinol: NOT DETECTED

## 2012-07-02 LAB — OB RESULTS CONSOLE GBS: GBS: NEGATIVE

## 2012-07-02 MED ORDER — OXYCODONE-ACETAMINOPHEN 5-325 MG PO TABS: 1.0000 | ORAL_TABLET | ORAL | Status: DC | PRN

## 2012-07-02 MED ORDER — ACETAMINOPHEN 325 MG PO TABS
650.0000 mg | ORAL_TABLET | ORAL | Status: DC | PRN
  Administered 2012-07-02: 650 mg via ORAL
  Filled 2012-07-02: qty 2

## 2012-07-02 MED ORDER — ACETAMINOPHEN 500 MG PO TABS: 1000.0000 mg | ORAL_TABLET | ORAL | Status: DC | PRN

## 2012-07-02 MED ORDER — METHYLDOPA 500 MG PO TABS
1000.0000 mg | ORAL_TABLET | Freq: Three times a day (TID) | ORAL | Status: DC
  Administered 2012-07-02: 1000 mg via ORAL
  Filled 2012-07-02 (×5): qty 2

## 2012-07-02 MED ORDER — BUTORPHANOL TARTRATE 1 MG/ML IJ SOLN
1.0000 mg | Freq: Once | INTRAMUSCULAR | Status: AC
  Administered 2012-07-02: 1 mg via INTRAVENOUS
  Filled 2012-07-02: qty 1

## 2012-07-02 MED ORDER — LIDOCAINE HCL (PF) 1 % IJ SOLN: 30.0000 mL | INTRAMUSCULAR | Status: DC | PRN

## 2012-07-02 MED ORDER — HYDRALAZINE HCL 20 MG/ML IJ SOLN
5.0000 mg | INTRAMUSCULAR | Status: AC | PRN
  Administered 2012-07-02: 5 mg via INTRAVENOUS
  Administered 2012-07-02: 10 mg via INTRAVENOUS
  Filled 2012-07-02 (×2): qty 1

## 2012-07-02 MED ORDER — HYDRALAZINE HCL 20 MG/ML IJ SOLN
5.0000 mg | Freq: Once | INTRAMUSCULAR | Status: AC
  Administered 2012-07-02: 5 mg via INTRAVENOUS
  Filled 2012-07-02: qty 1

## 2012-07-02 MED ORDER — CITALOPRAM HYDROBROMIDE 40 MG PO TABS
40.0000 mg | ORAL_TABLET | Freq: Every day | ORAL | Status: DC
  Administered 2012-07-02: 40 mg via ORAL
  Filled 2012-07-02 (×2): qty 1

## 2012-07-02 MED ORDER — DOCUSATE SODIUM 100 MG PO CAPS: 100.0000 mg | ORAL_CAPSULE | Freq: Every day | ORAL | Status: DC

## 2012-07-02 MED ORDER — PRENATAL MULTIVITAMIN CH: 1.0000 | ORAL_TABLET | Freq: Every day | ORAL | Status: DC

## 2012-07-02 MED ORDER — OXYCODONE-ACETAMINOPHEN 5-325 MG PO TABS
1.0000 | ORAL_TABLET | ORAL | Status: DC | PRN
  Administered 2012-07-02: 2 via ORAL
  Filled 2012-07-02: qty 2

## 2012-07-02 MED ORDER — OXYTOCIN 40 UNITS IN LACTATED RINGERS INFUSION - SIMPLE MED: 62.5000 mL/h | Freq: Once | INTRAVENOUS | Status: DC

## 2012-07-02 MED ORDER — LACTATED RINGERS IV SOLN
INTRAVENOUS | Status: DC
  Administered 2012-07-02 (×2): via INTRAVENOUS
  Administered 2012-07-03: 91 mL/h via INTRAVENOUS

## 2012-07-02 MED ORDER — ZOLPIDEM TARTRATE 5 MG PO TABS: 5.0000 mg | ORAL_TABLET | Freq: Every evening | ORAL | Status: DC | PRN

## 2012-07-02 MED ORDER — TERBUTALINE SULFATE 1 MG/ML IJ SOLN: 0.2500 mg | Freq: Once | INTRAMUSCULAR | Status: AC | PRN

## 2012-07-02 MED ORDER — OXYTOCIN 10 UNIT/ML IJ SOLN: 10.0000 [IU] | Freq: Once | INTRAMUSCULAR | Status: DC

## 2012-07-02 MED ORDER — OXYTOCIN BOLUS FROM INFUSION
500.0000 mL | Freq: Once | INTRAVENOUS | Status: DC
  Filled 2012-07-02: qty 500

## 2012-07-02 MED ORDER — LACTATED RINGERS IV SOLN
INTRAVENOUS | Status: DC
  Administered 2012-07-02: 22:00:00 via INTRAVENOUS
  Administered 2012-07-03: 100 mL/h via INTRAVENOUS

## 2012-07-02 MED ORDER — CALCIUM CARBONATE ANTACID 500 MG PO CHEW: 2.0000 | CHEWABLE_TABLET | ORAL | Status: DC | PRN

## 2012-07-02 MED ORDER — METHYLDOPA 500 MG PO TABS
500.0000 mg | ORAL_TABLET | Freq: Three times a day (TID) | ORAL | Status: DC
  Filled 2012-07-02 (×3): qty 1

## 2012-07-02 MED ORDER — ONDANSETRON HCL 4 MG/2ML IJ SOLN: 4.0000 mg | Freq: Four times a day (QID) | INTRAMUSCULAR | Status: DC | PRN

## 2012-07-02 MED ORDER — IBUPROFEN 600 MG PO TABS: 600.0000 mg | ORAL_TABLET | Freq: Four times a day (QID) | ORAL | Status: DC | PRN

## 2012-07-02 MED ORDER — CITRIC ACID-SODIUM CITRATE 334-500 MG/5ML PO SOLN
30.0000 mL | ORAL | Status: DC | PRN
  Administered 2012-07-03: 30 mL via ORAL
  Filled 2012-07-02: qty 15

## 2012-07-02 MED ORDER — FENTANYL CITRATE 0.05 MG/ML IJ SOLN
100.0000 ug | INTRAMUSCULAR | Status: DC | PRN
  Administered 2012-07-03 (×5): 100 ug via INTRAVENOUS
  Filled 2012-07-02 (×5): qty 2

## 2012-07-02 MED ORDER — METHYLDOPA 500 MG PO TABS
500.0000 mg | ORAL_TABLET | Freq: Three times a day (TID) | ORAL | Status: DC
  Administered 2012-07-02: 500 mg via ORAL
  Filled 2012-07-02 (×3): qty 1

## 2012-07-02 MED ORDER — OXYTOCIN 40 UNITS IN LACTATED RINGERS INFUSION - SIMPLE MED
1.0000 m[IU]/min | INTRAVENOUS | Status: DC
  Administered 2012-07-02: 2 m[IU]/min via INTRAVENOUS
  Administered 2012-07-03: 6 m[IU]/min via INTRAVENOUS
  Administered 2012-07-03: 2 m[IU]/min via INTRAVENOUS
  Filled 2012-07-02: qty 1000

## 2012-07-02 MED ORDER — LACTATED RINGERS IV SOLN: 500.0000 mL | INTRAVENOUS | Status: DC | PRN

## 2012-07-02 MED ORDER — METHYLDOPA 500 MG PO TABS
500.0000 mg | ORAL_TABLET | Freq: Once | ORAL | Status: AC
  Administered 2012-07-02: 500 mg via ORAL
  Filled 2012-07-02: qty 1

## 2012-07-02 NOTE — Progress Notes (Signed)
[redacted]w[redacted]d Pt c/o HA dizziness and back pain. GBS today Sono:  EFW:  6lb 2oz  25.8 %  AFI 16.14 cm   Vertex presentation, anterior placenta, AFI is normal 60th%tile   BPP 8/8

## 2012-07-02 NOTE — MAU Note (Signed)
Patient is sent from the office for PIH eval. She states that her bp were 160/100 at the office. She denies visual disturbance or epigastric pain at this time. She reports headache that she didn't want tylenol for. She denies vaginal bleeding. Reports good fetal movement.

## 2012-07-02 NOTE — Progress Notes (Signed)
Patient ID: Allison [name], female   DOB: 01/26/1986, 26 y.o.   MRN: [id number] .Subjective:  Pt continues to have headache, pain scale 4-/10, denies N/V/RUQ pain   Objective: BP 164/91  Pulse 96  Temp 97.9 F (36.6 C) (Oral)  Resp 20  Ht 5' 6.5" (1.689 m)  Wt 304 lb (137.893 kg)  BMI 48.33 kg/m2  LMP 09/15/2011    FHT:  FHR: 130 bpm, variability: moderate,  accelerations:  Present,  decelerations:  Absent UC:   none SVE:   Dilation: 2.5 Effacement (%): 80 Station: 0 Exam by:: c soliz rn    Assessment / Plan: BP mildly improved after hydralazine IVP GBS neg GHTN w possible superimposed pre-eclampsia PIH labs were WNL, except slight increase in AST and 1+ protein on UA 24 hour urine collection in progress  rv'd w Dr Su Hilt, will tx to L&D and begin pitocin induction Will repeat labs when pt closer to needing epidural  Will increase aldomet to 1000mg  every 8 hour and give additional 500mg  now rv'd plan w pt and family, agrees    Fetal Wellbeing:  Category I Pain Control:  n/a  Update physician PRN  Malissa Hippo 07/02/2012, 11:04 PM

## 2012-07-02 NOTE — H&P (Signed)
Allison [name] is a 26 y.o. female presenting for PIH eval, sent from office secondary to elevated BP's. Pt is a G2P0 at 37wks. Has been on BR at home for [facility], also on aldomet since 31wks. Pt denies any ctx, VB, LOF, reports +FM. C/O persistent HA, tylenol hasn't helped, denies N/V/RUQ pain, no blurry vision.   HPI: pt began [facility] at [facility] at 10wks, she had an [place] at 7wks for first trimester bleeding, EDC was established at 07/23/12 w unk LMP. Pt has a hx of depression/anxiety and has been on lexapro and wellbutrin. AT 19wks anatomy scan showed Bil CPC, f/u US was WNL. Pt had a +FFN at 31wks and was given a course of BMZ. She was also started on aldomet for HBP, PIH labs were done and repeats have been WNL. Prior 24hour urine for protein have been less <300. GBS collected today.   Maternal Medical History:  Reason for admission: Reason for Admission:   nauseaElevated BP  Fetal activity: Perceived fetal activity is normal.   Last perceived fetal movement was within the past hour.    Prenatal complications: Hypertension.     OB History    Grav Para Term Preterm Abortions TAB SAB Ect Mult Living   2 0 0 0 0 0 0 0 0 0      Past Medical History  Diagnosis Date  . Asthma   . Allergy   . Hyperlipidemia   . UTI (lower urinary tract infection)   . Migraine   . Chicken pox   . Smoker   . Depression     In counseling with Dr. Ledon Snare in [place]  . Seizures    Past Surgical History  Procedure Date  . Tonsillectomy   . Wisdom tooth extraction 2007   Family History: family history includes Alcohol abuse in her brother; Cancer in her maternal grandfather; Depression in her mother and other; Heart disease in her paternal aunt; Hypertension in her father, mother, and paternal grandmother; Hypothyroidism in her brother, maternal grandmother, and mother; and Mental illness in her sister. Social History:  reports that she quit smoking about 7 months ago. Her smoking use included Cigarettes. She  has a 8 pack-year smoking history. She has never used smokeless tobacco. She reports that she drinks about .5 ounces of alcohol per week. She reports that she uses illicit drugs (Marijuana) about once per week.   Prenatal Transfer Tool  Maternal Diabetes: No Genetic Screening: Declined Maternal Ultrasounds/Referrals: Normal Fetal Ultrasounds or other Referrals:  None Maternal Substance Abuse:  Yes:  Type: Smoker, Marijuana, Other:  Pt states she quit both in Feb Significant Maternal Medications:  Meds include: Other:  aldomet  Significant Maternal Lab Results:  None Other Comments:  None  Review of Systems  Eyes: Negative for blurred vision and double vision.  Respiratory: Negative for shortness of breath.   Cardiovascular: Negative for chest pain and leg swelling.  Gastrointestinal: Positive for heartburn. Negative for nausea, vomiting, abdominal pain, diarrhea and constipation.  Genitourinary: Negative for dysuria.  Musculoskeletal: Positive for back pain.  Neurological: Positive for dizziness and headaches.  Psychiatric/Behavioral: The patient is nervous/anxious.   All other systems reviewed and are negative.      Blood pressure 146/80, pulse 98, temperature 98.4 F (36.9 C), temperature source Oral, resp. rate 20, height 5' 6.5" (1.689 m), weight 304 lb (137.893 kg), last menstrual period 09/15/2011. Maternal Exam:  Abdomen: Patient reports no abdominal tenderness. Fundal height is aga.   Estimated fetal weight is  6-7.   Fetal presentation: vertex  Introitus: not evaluated.   Cervix: not evaluated.   Fetal Exam Fetal Monitor Review: Mode: ultrasound.   Baseline rate: 140.  Variability: moderate (6-25 bpm).   Pattern: accelerations present and no decelerations.    Fetal State Assessment: Category I - tracings are normal.     Physical Exam  Nursing note and vitals reviewed. Constitutional: She is oriented to person, place, and time. She appears well-developed and  well-nourished.  HENT:  Head: Normocephalic.  Eyes: Pupils are equal, round, and reactive to light.  Neck: Normal range of motion.  Cardiovascular: Normal rate, regular rhythm and normal heart sounds.   Respiratory: Effort normal and breath sounds normal.  GI: Soft. Bowel sounds are normal.  Musculoskeletal: Normal range of motion. She exhibits no edema.  Neurological: She is alert and oriented to person, place, and time. She has normal reflexes.       Neg clonus   Skin: Skin is warm and dry.  Psychiatric: She has a normal mood and affect. Her behavior is normal.    Prenatal labs: ABO, Rh: --/--/A POS, A POS (08/15 0700) Antibody: NEG (08/15 0700) Rubella: Immune (03/06 0000) RPR: NON REAC (07/16 1737)  HBsAg: Negative (03/06 0000)  HIV: Non-reactive (03/06 0000)  GBS:   collected 9/25 upon admission   Assessment/Plan: IUP at 37wks PIH FHR reassuring GBS pending Favorable cervix - bishop =8  Admit to antenatal per c/w Dr Su Hilt [facility] labs and UA Begin 24hour urine collection Serial BP's Continue aldomet 500mg  TID Give hydralazine 5mg  IVP for BP >160/105, may repeat and give 10mg  if still elvated Collect GBS    Onesimo Lingard M 07/02/2012, 3:14 PM

## 2012-07-02 NOTE — MAU Note (Signed)
Sent over from office, BP was really high today. 2+/80/0st.  Was put on BP medication a month ago.  Has a headache.  Denies visual change, reports swelling in hands and face.

## 2012-07-02 NOTE — Progress Notes (Signed)
PIH on Aldomet 500 mg TID with 24 hour urine 06/20/12=140 mg Ultrasound: AGA with normal AFI and BPP 8/8 BP 180/130,  160/100  Pt c/o headaches and dizziness Pt to MAU +/- IOL

## 2012-07-02 NOTE — Progress Notes (Signed)
Report given to Scheryl Marten, RN.

## 2012-07-02 NOTE — Progress Notes (Signed)
Pt transported to L&D room 167 via wheelchair.

## 2012-07-03 ENCOUNTER — Inpatient Hospital Stay (HOSPITAL_COMMUNITY): Payer: Medicaid Other | Admitting: Anesthesiology

## 2012-07-03 ENCOUNTER — Encounter (HOSPITAL_COMMUNITY): Payer: Self-pay | Admitting: Anesthesiology

## 2012-07-03 ENCOUNTER — Encounter (HOSPITAL_COMMUNITY)
Admission: AD | Disposition: A | Discharge status: Home / Self Care | Payer: Self-pay | Attending: Obstetrics and Gynecology

## 2012-07-03 ENCOUNTER — Encounter (HOSPITAL_COMMUNITY): Payer: Self-pay | Admitting: *Deleted

## 2012-07-03 DIAGNOSIS — Z98891 History of uterine scar from previous surgery: Secondary | ICD-10-CM | POA: Diagnosis not present

## 2012-07-03 DIAGNOSIS — IMO0002 Reserved for concepts with insufficient information to code with codable children: Secondary | ICD-10-CM

## 2012-07-03 LAB — COMPREHENSIVE METABOLIC PANEL
ALT: 17 U/L (ref 0–35)
AST: 32 U/L (ref 0–37)
Albumin: 2.6 g/dL — ABNORMAL LOW (ref 3.5–5.2)
Alkaline Phosphatase: 96 U/L (ref 39–117)
BUN: 12 mg/dL (ref 6–23)
Chloride: 101 mEq/L (ref 96–112)
Potassium: 4 mEq/L (ref 3.5–5.1)
Sodium: 135 mEq/L (ref 135–145)
Total Bilirubin: 0.2 mg/dL — ABNORMAL LOW (ref 0.3–1.2)
Total Protein: 5.9 g/dL — ABNORMAL LOW (ref 6.0–8.3)

## 2012-07-03 LAB — CBC
MCH: 28.8 pg (ref 26.0–34.0)
MCV: 88.5 fL (ref 78.0–100.0)
Platelets: 128 10*3/uL — ABNORMAL LOW (ref 150–400)
Platelets: 135 10*3/uL — ABNORMAL LOW (ref 150–400)
Platelets: 124 10*3/uL — ABNORMAL LOW (ref 150–400)
RBC: 4.48 MIL/uL (ref 3.87–5.11)
RBC: 4.19 MIL/uL (ref 3.87–5.11)
RDW: 15.2 % (ref 11.5–15.5)
WBC: 11.7 10*3/uL — ABNORMAL HIGH (ref 4.0–10.5)
WBC: 13 10*3/uL — ABNORMAL HIGH (ref 4.0–10.5)
WBC: 15.2 10*3/uL — ABNORMAL HIGH (ref 4.0–10.5)

## 2012-07-03 LAB — LACTATE DEHYDROGENASE: LDH: 160 U/L (ref 94–250)

## 2012-07-03 LAB — PROTEIN, URINE, 24 HOUR
Protein, 24H Urine: 133 mg/d — ABNORMAL HIGH (ref 50–100)
Protein, Urine: 10 mg/dL

## 2012-07-03 SURGERY — Surgical Case
Anesthesia: Epidural | Wound class: Clean Contaminated

## 2012-07-03 MED ORDER — SIMETHICONE 80 MG PO CHEW
80.0000 mg | CHEWABLE_TABLET | Freq: Three times a day (TID) | ORAL | Status: DC
  Administered 2012-07-03 – 2012-07-06 (×9): 80 mg via ORAL

## 2012-07-03 MED ORDER — DIPHENHYDRAMINE HCL 50 MG/ML IJ SOLN: 12.5000 mg | INTRAMUSCULAR | Status: DC | PRN

## 2012-07-03 MED ORDER — LANOLIN HYDROUS EX OINT: 1.0000 "application " | TOPICAL_OINTMENT | CUTANEOUS | Status: DC | PRN

## 2012-07-03 MED ORDER — IBUPROFEN 600 MG PO TABS
600.0000 mg | ORAL_TABLET | Freq: Four times a day (QID) | ORAL | Status: DC
  Administered 2012-07-04 – 2012-07-06 (×11): 600 mg via ORAL
  Filled 2012-07-03 (×7): qty 1

## 2012-07-03 MED ORDER — MENTHOL 3 MG MT LOZG
1.0000 | LOZENGE | OROMUCOSAL | Status: DC | PRN
  Filled 2012-07-03: qty 9

## 2012-07-03 MED ORDER — SODIUM CHLORIDE 0.9 % IV SOLN
1.0000 ug/kg/h | INTRAVENOUS | Status: DC | PRN
  Filled 2012-07-03: qty 2.5

## 2012-07-03 MED ORDER — FENTANYL 2.5 MCG/ML BUPIVACAINE 1/10 % EPIDURAL INFUSION (WH - ANES)
INTRAMUSCULAR | Status: DC | PRN
  Administered 2012-07-03: 14 mL/h via EPIDURAL

## 2012-07-03 MED ORDER — LIDOCAINE-EPINEPHRINE (PF) 2 %-1:200000 IJ SOLN
INTRAMUSCULAR | Status: AC
  Filled 2012-07-03: qty 20

## 2012-07-03 MED ORDER — SENNOSIDES-DOCUSATE SODIUM 8.6-50 MG PO TABS
2.0000 | ORAL_TABLET | Freq: Every day | ORAL | Status: DC
  Administered 2012-07-03: 2 via ORAL
  Administered 2012-07-04: 1 via ORAL
  Administered 2012-07-05: 2 via ORAL

## 2012-07-03 MED ORDER — SIMETHICONE 80 MG PO CHEW: 80.0000 mg | CHEWABLE_TABLET | ORAL | Status: DC | PRN

## 2012-07-03 MED ORDER — OXYTOCIN 10 UNIT/ML IJ SOLN
40.0000 [IU] | INTRAVENOUS | Status: DC | PRN
  Administered 2012-07-03: 40 [IU] via INTRAVENOUS

## 2012-07-03 MED ORDER — HYDRALAZINE HCL 20 MG/ML IJ SOLN: 5.0000 mg | INTRAMUSCULAR | Status: DC | PRN

## 2012-07-03 MED ORDER — LACTATED RINGERS IV SOLN
Freq: Once | INTRAVENOUS | Status: AC
  Administered 2012-07-03: 18:00:00 via INTRAUTERINE
  Administered 2012-07-03: 300 mL/h via INTRAUTERINE

## 2012-07-03 MED ORDER — NALOXONE HCL 0.4 MG/ML IJ SOLN: 0.4000 mg | INTRAMUSCULAR | Status: DC | PRN

## 2012-07-03 MED ORDER — SCOPOLAMINE 1 MG/3DAYS TD PT72
MEDICATED_PATCH | TRANSDERMAL | Status: AC
  Filled 2012-07-03: qty 1

## 2012-07-03 MED ORDER — IBUPROFEN 600 MG PO TABS
600.0000 mg | ORAL_TABLET | Freq: Four times a day (QID) | ORAL | Status: DC | PRN
  Filled 2012-07-03 (×4): qty 1

## 2012-07-03 MED ORDER — LACTATED RINGERS IV SOLN
INTRAVENOUS | Status: DC
  Administered 2012-07-03 – 2012-07-04 (×5): via INTRAVENOUS

## 2012-07-03 MED ORDER — PHENYLEPHRINE 40 MCG/ML (10ML) SYRINGE FOR IV PUSH (FOR BLOOD PRESSURE SUPPORT): 80.0000 ug | PREFILLED_SYRINGE | INTRAVENOUS | Status: DC | PRN

## 2012-07-03 MED ORDER — SODIUM BICARBONATE 8.4 % IV SOLN
INTRAVENOUS | Status: DC | PRN
  Administered 2012-07-03: 4 mL via EPIDURAL

## 2012-07-03 MED ORDER — DIPHENHYDRAMINE HCL 50 MG/ML IJ SOLN: 25.0000 mg | INTRAMUSCULAR | Status: DC | PRN

## 2012-07-03 MED ORDER — KETOROLAC TROMETHAMINE 60 MG/2ML IM SOLN
60.0000 mg | Freq: Once | INTRAMUSCULAR | Status: AC | PRN
  Administered 2012-07-03: 60 mg via INTRAMUSCULAR

## 2012-07-03 MED ORDER — DEXTROSE 5 % IV SOLN
3.0000 g | Freq: Once | INTRAVENOUS | Status: DC
Start: 2012-07-03 — End: 2012-07-03
  Filled 2012-07-03: qty 3000

## 2012-07-03 MED ORDER — MORPHINE SULFATE (PF) 0.5 MG/ML IJ SOLN
INTRAMUSCULAR | Status: DC | PRN
  Administered 2012-07-03: 3 ug via EPIDURAL

## 2012-07-03 MED ORDER — PRENATAL MULTIVITAMIN CH
1.0000 | ORAL_TABLET | Freq: Every day | ORAL | Status: DC
  Administered 2012-07-04 – 2012-07-06 (×3): 1 via ORAL
  Filled 2012-07-03 (×3): qty 1

## 2012-07-03 MED ORDER — SCOPOLAMINE 1 MG/3DAYS TD PT72
1.0000 | MEDICATED_PATCH | Freq: Once | TRANSDERMAL | Status: DC
  Administered 2012-07-03: 1.5 mg via TRANSDERMAL

## 2012-07-03 MED ORDER — DIBUCAINE 1 % RE OINT
1.0000 "application " | TOPICAL_OINTMENT | RECTAL | Status: DC | PRN
  Filled 2012-07-03: qty 28

## 2012-07-03 MED ORDER — FENTANYL CITRATE 0.05 MG/ML IJ SOLN: 25.0000 ug | INTRAMUSCULAR | Status: DC | PRN

## 2012-07-03 MED ORDER — ONDANSETRON HCL 4 MG/2ML IJ SOLN
INTRAMUSCULAR | Status: DC | PRN
  Administered 2012-07-03: 4 mg via INTRAVENOUS

## 2012-07-03 MED ORDER — MAGNESIUM SULFATE 40 MG/ML IJ SOLN: 4.0000 g | Freq: Once | INTRAMUSCULAR | Status: DC

## 2012-07-03 MED ORDER — SODIUM CHLORIDE 0.9 % IJ SOLN: 3.0000 mL | INTRAMUSCULAR | Status: DC | PRN

## 2012-07-03 MED ORDER — OXYCODONE-ACETAMINOPHEN 5-325 MG PO TABS
1.0000 | ORAL_TABLET | ORAL | Status: DC | PRN
  Administered 2012-07-04 – 2012-07-06 (×4): 2 via ORAL
  Filled 2012-07-03 (×4): qty 2

## 2012-07-03 MED ORDER — ONDANSETRON HCL 4 MG/2ML IJ SOLN
INTRAMUSCULAR | Status: AC
  Filled 2012-07-03: qty 2

## 2012-07-03 MED ORDER — KETOROLAC TROMETHAMINE 60 MG/2ML IM SOLN
INTRAMUSCULAR | Status: AC
  Filled 2012-07-03: qty 2

## 2012-07-03 MED ORDER — METOCLOPRAMIDE HCL 5 MG/ML IJ SOLN: 10.0000 mg | Freq: Three times a day (TID) | INTRAMUSCULAR | Status: DC | PRN

## 2012-07-03 MED ORDER — NALBUPHINE HCL 10 MG/ML IJ SOLN
5.0000 mg | INTRAMUSCULAR | Status: DC | PRN
  Filled 2012-07-03: qty 1

## 2012-07-03 MED ORDER — TETANUS-DIPHTH-ACELL PERTUSSIS 5-2.5-18.5 LF-MCG/0.5 IM SUSP
0.5000 mL | Freq: Once | INTRAMUSCULAR | Status: DC
  Filled 2012-07-03: qty 0.5

## 2012-07-03 MED ORDER — FENTANYL 2.5 MCG/ML BUPIVACAINE 1/10 % EPIDURAL INFUSION (WH - ANES)
14.0000 mL/h | INTRAMUSCULAR | Status: DC
  Administered 2012-07-03: 14 mL/h via EPIDURAL
  Filled 2012-07-03 (×2): qty 60

## 2012-07-03 MED ORDER — WITCH HAZEL-GLYCERIN EX PADS: 1.0000 "application " | MEDICATED_PAD | CUTANEOUS | Status: DC | PRN

## 2012-07-03 MED ORDER — BUPIVACAINE-EPINEPHRINE 0.5% -1:200000 IJ SOLN
INTRAMUSCULAR | Status: DC | PRN
  Administered 2012-07-03: 10 mL

## 2012-07-03 MED ORDER — MORPHINE SULFATE 0.5 MG/ML IJ SOLN
INTRAMUSCULAR | Status: AC
  Filled 2012-07-03: qty 10

## 2012-07-03 MED ORDER — DEXTROSE 5 % IV SOLN
3.0000 g | INTRAVENOUS | Status: DC | PRN
  Administered 2012-07-03: 3 g via INTRAVENOUS

## 2012-07-03 MED ORDER — DEXTROSE 5 % IV SOLN: 3.0000 g | INTRAVENOUS | Status: DC

## 2012-07-03 MED ORDER — MAGNESIUM SULFATE BOLUS VIA INFUSION
4.0000 g | Freq: Once | INTRAVENOUS | Status: AC
  Administered 2012-07-03: 4 g
  Filled 2012-07-03: qty 500

## 2012-07-03 MED ORDER — KETOROLAC TROMETHAMINE 30 MG/ML IJ SOLN
30.0000 mg | Freq: Four times a day (QID) | INTRAMUSCULAR | Status: AC | PRN
  Filled 2012-07-03: qty 1

## 2012-07-03 MED ORDER — LACTATED RINGERS IV SOLN: 500.0000 mL | Freq: Once | INTRAVENOUS | Status: DC

## 2012-07-03 MED ORDER — MEDROXYPROGESTERONE ACETATE 150 MG/ML IM SUSP: 150.0000 mg | INTRAMUSCULAR | Status: DC | PRN

## 2012-07-03 MED ORDER — PHENYLEPHRINE HCL 10 MG/ML IJ SOLN
INTRAMUSCULAR | Status: DC | PRN
  Administered 2012-07-03 (×2): 80 ug via INTRAVENOUS
  Administered 2012-07-03: 40 ug via INTRAVENOUS

## 2012-07-03 MED ORDER — LACTATED RINGERS IV SOLN
INTRAVENOUS | Status: DC | PRN
  Administered 2012-07-03: 19:00:00 via INTRAVENOUS

## 2012-07-03 MED ORDER — ONDANSETRON HCL 4 MG/2ML IJ SOLN: 4.0000 mg | INTRAMUSCULAR | Status: DC | PRN

## 2012-07-03 MED ORDER — PHENYLEPHRINE 40 MCG/ML (10ML) SYRINGE FOR IV PUSH (FOR BLOOD PRESSURE SUPPORT)
80.0000 ug | PREFILLED_SYRINGE | INTRAVENOUS | Status: DC | PRN
  Administered 2012-07-03: 80 ug via INTRAVENOUS
  Filled 2012-07-03: qty 5

## 2012-07-03 MED ORDER — DIPHENHYDRAMINE HCL 25 MG PO CAPS
25.0000 mg | ORAL_CAPSULE | Freq: Four times a day (QID) | ORAL | Status: DC | PRN
  Administered 2012-07-04: 25 mg via ORAL

## 2012-07-03 MED ORDER — ONDANSETRON HCL 4 MG PO TABS: 4.0000 mg | ORAL_TABLET | ORAL | Status: DC | PRN

## 2012-07-03 MED ORDER — SODIUM BICARBONATE 8.4 % IV SOLN
INTRAVENOUS | Status: AC
  Filled 2012-07-03: qty 50

## 2012-07-03 MED ORDER — MAGNESIUM SULFATE 40 G IN LACTATED RINGERS - SIMPLE
2.0000 g/h | INTRAVENOUS | Status: DC
  Administered 2012-07-03 – 2012-07-04 (×2): 2 g/h via INTRAVENOUS
  Filled 2012-07-03 (×2): qty 500

## 2012-07-03 MED ORDER — TETANUS-DIPHTH-ACELL PERTUSSIS 5-2.5-18.5 LF-MCG/0.5 IM SUSP
0.5000 mL | INTRAMUSCULAR | Status: AC | PRN
  Administered 2012-07-06: 0.5 mL via INTRAMUSCULAR
  Filled 2012-07-03: qty 0.5

## 2012-07-03 MED ORDER — MEPERIDINE HCL 25 MG/ML IJ SOLN: 6.2500 mg | INTRAMUSCULAR | Status: DC | PRN

## 2012-07-03 MED ORDER — MAGNESIUM SULFATE 40 G IN LACTATED RINGERS - SIMPLE
2.0000 g/h | INTRAVENOUS | Status: DC
  Administered 2012-07-03: 8 g/h via INTRAVENOUS
  Filled 2012-07-03: qty 500

## 2012-07-03 MED ORDER — ZOLPIDEM TARTRATE 5 MG PO TABS: 5.0000 mg | ORAL_TABLET | Freq: Every evening | ORAL | Status: DC | PRN

## 2012-07-03 MED ORDER — LABETALOL HCL 5 MG/ML IV SOLN: 10.0000 mg | Freq: Once | INTRAVENOUS | Status: DC

## 2012-07-03 MED ORDER — OXYTOCIN 40 UNITS IN LACTATED RINGERS INFUSION - SIMPLE MED
62.5000 mL/h | INTRAVENOUS | Status: AC
  Administered 2012-07-03: 62.5 mL/h via INTRAVENOUS
  Filled 2012-07-03: qty 1000

## 2012-07-03 MED ORDER — MEASLES, MUMPS & RUBELLA VAC ~~LOC~~ INJ: 0.5000 mL | INJECTION | Freq: Once | SUBCUTANEOUS | Status: DC

## 2012-07-03 MED ORDER — ONDANSETRON HCL 4 MG/2ML IJ SOLN: 4.0000 mg | Freq: Three times a day (TID) | INTRAMUSCULAR | Status: DC | PRN

## 2012-07-03 MED ORDER — DIPHENHYDRAMINE HCL 25 MG PO CAPS
25.0000 mg | ORAL_CAPSULE | ORAL | Status: DC | PRN
  Filled 2012-07-03: qty 1

## 2012-07-03 MED ORDER — EPHEDRINE 5 MG/ML INJ: 10.0000 mg | INTRAVENOUS | Status: DC | PRN

## 2012-07-03 MED ORDER — PHENYLEPHRINE 40 MCG/ML (10ML) SYRINGE FOR IV PUSH (FOR BLOOD PRESSURE SUPPORT)
PREFILLED_SYRINGE | INTRAVENOUS | Status: AC
  Filled 2012-07-03: qty 5

## 2012-07-03 MED ORDER — EPHEDRINE 5 MG/ML INJ
10.0000 mg | INTRAVENOUS | Status: AC | PRN
  Administered 2012-07-03 (×2): 10 mg via INTRAVENOUS
  Filled 2012-07-03: qty 4

## 2012-07-03 SURGICAL SUPPLY — 43 items
CLOTH BEACON ORANGE TIMEOUT ST (SAFETY) ×2 IMPLANT
CONTAINER PREFILL 10% NBF 15ML (MISCELLANEOUS) IMPLANT
DRAIN JACKSON PRT FLT 7MM (DRAIN) IMPLANT
DRAPE SURG 17X23 STRL (DRAPES) ×2 IMPLANT
DRESSING TELFA 8X3 (GAUZE/BANDAGES/DRESSINGS) ×2 IMPLANT
DRSG COVADERM 4X10 (GAUZE/BANDAGES/DRESSINGS) ×1 IMPLANT
DURAPREP 26ML APPLICATOR (WOUND CARE) ×2 IMPLANT
ELECT REM PT RETURN 9FT ADLT (ELECTROSURGICAL) ×2
ELECTRODE REM PT RTRN 9FT ADLT (ELECTROSURGICAL) ×1 IMPLANT
EVACUATOR SILICONE 100CC (DRAIN) IMPLANT
EXTRACTOR VACUUM M CUP 4 TUBE (SUCTIONS) IMPLANT
GAUZE SPONGE 4X4 12PLY STRL LF (GAUZE/BANDAGES/DRESSINGS) ×4 IMPLANT
GLOVE BIOGEL PI IND STRL 8.5 (GLOVE) ×1 IMPLANT
GLOVE BIOGEL PI INDICATOR 8.5 (GLOVE) ×1
GLOVE ECLIPSE 8.0 STRL XLNG CF (GLOVE) ×4 IMPLANT
GOWN PREVENTION PLUS LG XLONG (DISPOSABLE) ×4 IMPLANT
GOWN PREVENTION PLUS XXLARGE (GOWN DISPOSABLE) ×2 IMPLANT
KIT ABG SYR 3ML LUER SLIP (SYRINGE) IMPLANT
NDL HYPO 25X1 1.5 SAFETY (NEEDLE) ×1 IMPLANT
NDL HYPO 25X5/8 SAFETYGLIDE (NEEDLE) IMPLANT
NEEDLE HYPO 25X1 1.5 SAFETY (NEEDLE) ×2 IMPLANT
NEEDLE HYPO 25X5/8 SAFETYGLIDE (NEEDLE) IMPLANT
PACK C SECTION WH (CUSTOM PROCEDURE TRAY) ×2 IMPLANT
PAD ABD 7.5X8 STRL (GAUZE/BANDAGES/DRESSINGS) ×3 IMPLANT
PAD OB MATERNITY 4.3X12.25 (PERSONAL CARE ITEMS) IMPLANT
RINGERS IRRIG 1000ML POUR BTL (IV SOLUTION) ×2 IMPLANT
SLEEVE SCD COMPRESS KNEE MED (MISCELLANEOUS) IMPLANT
STAPLER VISISTAT 35W (STAPLE) IMPLANT
SUT MNCRL AB 3-0 PS2 27 (SUTURE) IMPLANT
SUT PLAIN 0 NONE (SUTURE) IMPLANT
SUT SILK 3 0 FS 1X18 (SUTURE) IMPLANT
SUT VIC AB 0 CT1 27 (SUTURE) ×4
SUT VIC AB 0 CT1 27XBRD ANBCTR (SUTURE) ×2 IMPLANT
SUT VIC AB 2-0 CTX 36 (SUTURE) ×4 IMPLANT
SUT VIC AB 3-0 CT1 27 (SUTURE)
SUT VIC AB 3-0 CT1 TAPERPNT 27 (SUTURE) IMPLANT
SUT VIC AB 3-0 SH 27 (SUTURE)
SUT VIC AB 3-0 SH 27X BRD (SUTURE) IMPLANT
SYR CONTROL 10ML LL (SYRINGE) ×2 IMPLANT
TAPE CLOTH SURG 4X10 WHT LF (GAUZE/BANDAGES/DRESSINGS) ×1 IMPLANT
TOWEL OR 17X24 6PK STRL BLUE (TOWEL DISPOSABLE) ×4 IMPLANT
TRAY FOLEY CATH 14FR (SET/KITS/TRAYS/PACK) ×2 IMPLANT
WATER STERILE IRR 1000ML POUR (IV SOLUTION) ×2 IMPLANT

## 2012-07-03 NOTE — Progress Notes (Signed)
Pt would like more pain meds for headache, stated it helped her get rest

## 2012-07-03 NOTE — Anesthesia Postprocedure Evaluation (Signed)
Anesthesia Post Note  Patient: Allison [name]  Procedure(s) Performed: Procedure(s) (LRB): CESAREAN SECTION (N/A)  Anesthesia type: Epidural  Patient location: PACU  Post pain: Pain level controlled  Post assessment: Post-op Vital signs reviewed  Last Vitals:  Filed Vitals:   07/03/12 1930  BP: 137/75  Pulse: 83  Temp:   Resp: 15    Post vital signs: Reviewed  Level of consciousness: awake  Complications: No apparent anesthesia complications

## 2012-07-03 NOTE — Anesthesia Preprocedure Evaluation (Addendum)
Anesthesia Evaluation  Patient identified by MRN, date of birth, ID band Patient awake    Reviewed: Allergy & Precautions, H&P , Patient's Chart, lab work & pertinent test results  History of Anesthesia Complications (+) DIFFICULT AIRWAY  Airway Mallampati: IV TM Distance: >3 FB Neck ROM: full    Dental  (+) Teeth Intact   Pulmonary asthma ,  breath sounds clear to auscultation        Cardiovascular hypertension (Severe PIH based on HA with incr BP's), On Medications Rhythm:regular Rate:Normal     Neuro/Psych    GI/Hepatic   Endo/Other  Morbid obesity  Renal/GU      Musculoskeletal   Abdominal   Peds  Hematology   Anesthesia Other Findings Stable plts      Reproductive/Obstetrics (+) Pregnancy                         Anesthesia Physical Anesthesia Plan  ASA: III  Anesthesia Plan: Epidural   Post-op Pain Management:    Induction:   Airway Management Planned:   Additional Equipment:   Intra-op Plan:   Post-operative Plan:   Informed Consent: I have reviewed the patients History and Physical, chart, labs and discussed the procedure including the risks, benefits and alternatives for the proposed anesthesia with the patient or authorized representative who has indicated his/her understanding and acceptance.   Dental Advisory Given  Plan Discussed with:   Anesthesia Plan Comments: (Labs checked- platelets confirmed with RN in room. Fetal heart tracing, per RN, reported to be stable enough for sitting procedure. Discussed epidural, and patient consents to the procedure:  included risk of possible headache,backache, failed block, allergic reaction, and nerve injury. This patient was asked if she had any questions or concerns before the procedure started. )        Anesthesia Quick Evaluation

## 2012-07-03 NOTE — Transfer of Care (Signed)
Immediate Anesthesia Transfer of Care Note  Patient: Allison [name]  Procedure(s) Performed: Procedure(s) (LRB) with comments: CESAREAN SECTION (N/A)  Patient Location: PACU  Anesthesia Type: Epidural  Level of Consciousness: awake  Airway & Oxygen Therapy: Patient Spontanous Breathing  Post-op Assessment: Report given to PACU RN  Post vital signs: Reviewed and stable  Complications: No apparent anesthesia complications

## 2012-07-03 NOTE — Progress Notes (Signed)
Comfortable, has been able to voidin bathroom and 24 hr urine collection continues - voiding and emptying in to jug. O VSS BP 143/63  Pulse 78  Temp 97.6 F (36.4 C) (Oral)  Resp 20  Ht 5' 6.5" (1.689 m)  Wt 304 lb (137.893 kg)  BMI 48.33 kg/m2  SpO2 100%  LMP 09/15/2011 Bolus Magnesium Sulfate 4gm IV infused - Maintenance - 2gm/hr iv      fhts category 1: baseline 135 bpm      abd soft between uc. Ctx pattern spacing. Ctx: 1: 5 -  6 mins       SVE: 3/ 80%// 0, AROM - clear - IUPC placed. FSE attempted but would not read and removed.      External monitoring for FHT's continues     Pitocin continues. A: PIH and superimposed Pre E - Magnesium Sulfate Tx now, 24 hr urine collection continues     AROM - clear , IUPC. Augmentation: Pitocin. P Continue care  [name], CNM.

## 2012-07-03 NOTE — Progress Notes (Signed)
Comfortable, recommenced pitocin to max of 6 milliunits. Variables decelerations with pitocin O VSS BP 108/46  Pulse 90  Temp 97.6 F (36.4 C) (Axillary)  Resp 20  Ht 5' 6.5" (1.689 m)  Wt 304 lb (137.893 kg)  BMI 48.33 kg/m2  SpO2 98%  LMP 09/15/2011       fhts category 2, varibables are recurrent    Pitocin 1/2 to - no change in variables and baseline 145 bpm  Pitocin off at 15.19.hrs and contractions have spaces and intermittent variables noted       abd soft between uc      Contractions 1: 4 mins       Vag 3/90/-2 A: PIH with Superimposed Pre E. Magnesium Sulfate  fro Seizure prevention P continue care  [name], CNM.

## 2012-07-03 NOTE — Progress Notes (Signed)
Pitocin was started to augment labor. However the fetal heart rate tracing becomes category 2 with Pitocin. The fetal heart rate tracing is now category 1 since the Pitocin has been discontinued. I know of no other way to effect a vaginal delivery. The patient agrees. We will proceed with cesarean delivery because the infant does not tolerate labor. The patient (and her husband and grandfather) understands the indications for the cesarean delivery. They accept the risk of, but not limited to, anesthetic complications, bleeding, infections, and possible damage to the surrounding organs.  Dr. Stefano Gaul

## 2012-07-03 NOTE — Progress Notes (Signed)
Allison [name] is a 26 y.o. G2P0000 at [redacted]w[redacted]d by LMP admitted for induction of labor due to Pre-eclamptic toxemia of pregnancy..  Subjective: The patient is now comfortable with her epidural. I was called to evaluate the patient for fetal heart rate decelerations associated with hypotension immediately after epidural placement. The fetal heart rate is now stable with intermittent variables.    Objective: BP 110/62  Pulse 84  Temp 97.6 F (36.4 C) (Oral)  Resp 20  Ht 5' 6.5" (1.689 m)  Wt 304 lb (137.893 kg)  BMI 48.33 kg/m2  SpO2 100%  LMP 09/15/2011 I/O last 3 completed shifts: In: 2904.4 [P.O.:1080; I.V.:1824.4] Out: 700 [Urine:700] Total I/O In: 3267 [P.O.:120; I.V.:3147] Out: 275 [Urine:275]  FHT:  FHR: 140s bpm, variability: minimal ,  accelerations:  Abscent,  decelerations:  Present Prolonged deceleration to a nadir of 80 beats per minute during hypotensive episode. UC:   regular, every 3-5 minutes.  Contractions have now spaced out since discontinuation of Pitocin SVE:   Dilation: 3 Effacement (%): 90 Station: 0 Exam by:: Allison [name]  Labs: Lab Results  Component Value Date   WBC 13.0* 07/03/2012   HGB 12.9 07/03/2012   HCT 39.7 07/03/2012   MCV 88.6 07/03/2012   PLT 135* 07/03/2012    Assessment / Plan: Nonreassuring fetal heart rate tracing initially associated with hypotension after her epidural Preeclampsia Status post rupture of members Decreased uterine contractions after discontinuation of Pitocin  Plan: A long discussion was held with the patient and her mother and her husband concerning options for management at this time.  Cesarean section with the risks of anesthesia bleeding infection damage to adjacent organs and possible need for cesarean section and subsequent pregnancies was reviewed. The option of a meal and fusion with an attempt at restarting Pitocin and induction of labor was likewise discussed with the risks of inability to induce labor,  fetal intolerance of labor, and prolonged labor with subsequent need for cesarean section. I have recommended amnioinfusion at this time and after all questions were answered and the cesarean section consent signed in the event of the need for an emergent cesarean section the appearance and prospective grandmother agreed to amnioinfusion and an attempt at reinstitution of Pitocin therapy.  Allison [name] P 07/03/2012, 1:30 PM

## 2012-07-03 NOTE — Progress Notes (Signed)
S Patient has been turned to Rt Lateral for Position change to help with repetitive late decelerations O: Pitocin has now been turned off to allow fetal resuscitation A: Observe Fetal tracing for recovery P: Conservative management with Continued Magnesium Sulfate Tx [name], CNM.

## 2012-07-03 NOTE — Progress Notes (Signed)
Comfortable with epidural  O VSS BP 121/58  Pulse 85  Temp 97.6 F (36.4 C) (Axillary)  Resp 20  Ht 5' 6.5" (1.689 m)  Wt 304 lb (137.893 kg)  BMI 48.33 kg/m2  SpO2 100%  LMP 09/15/2011  patient has had hypo-tensive episode post epidural. FHT's : Variables andm re current late decelerations. Contacted Dr Stefano Gaul re FHT's in the 80's and down for 3 -4 mins      abd soft between uc, pitocin has been off DR Stefano Gaul has requested that Dr Pennie Rushing would be contacted and Dr Pennie Rushing contacted and updated on the situation. Dr Pennie Rushing to bedside to evaluate. Discussed the labor progression with the family. Dr Pennie Rushing would like to try Amnio- infusion to seen if the infusion will assist with the fetal tolerance of labor.       Contractions  1: 4 mins      Vag 3/(0%/-1, clear fluid A PIH, Superimposed Pre E. Variable decelerations and late decelerations noted and Dr Pennie Rushing is aware of the situation. Amnio- infusion and possibility of recommencing Pitocin if FHT is reactive and will tolerate the addition of PItocin.  P continue care  [name], CNM.

## 2012-07-03 NOTE — Progress Notes (Signed)
FHT now Cat 1. Continue obs.  Dr. Stefano Gaul

## 2012-07-03 NOTE — Progress Notes (Signed)
The patient is now symptomatic for headache and black spots in front of eyes. Superimposed Pre E Condition discussed with Dr Stefano Gaul and have decided to start Magnesium Sulfate Therapy   O Temp:  [97.9 F (36.6 C)-98.8 F (37.1 C)] 97.9 F (36.6 C) (09/25 2158) Pulse Rate:  [70-125] 77  (09/26 0738) Resp:  [18-20] 18  (09/26 0717) BP: (121-177)/(68-115) 139/74 mmHg (09/26 0717) SpO2:  [100 %] 100 % (09/26 0738) Weight:  [304 lb (137.893 kg)] 304 lb (137.893 kg) (09/25 1247)      Fhts category 1 baseline : 130 bpm. Cat      Abd soft between uc difficult to trace Ctx and plan to AROm and place IUPC and FSE       After Magnesium Sulfate Bolus      Contractions: 1 : 5 - 6 mins with Palpation      SVE: deferred until after Magnesium Sulfate Bolus. A: PIH with Superimposed Pre E - Magnesium Sulfate Tx P: Magnesium Sulfate, Pitocin, - plan for AROM and internal monitoring to assist monitoring progress.   [name], CNM.

## 2012-07-03 NOTE — Progress Notes (Signed)
Comfortable, with epidural and Amnio-infusion continues at rate of 113ml/hr O VSS      FHT: Cat 1 with no Pitocin and as Pitocin has been introduced intermittent variables have recommenced.      abd soft between uc      Contractions  1 : 4 - 6 mins , palpate mild      Vag 3/90/-2 A: PIH and Superimposed Pre E , now on Magnesium Sulfate Tx. P Continue care  [name], CNM.

## 2012-07-03 NOTE — Op Note (Signed)
OPERATIVE NOTE  Patient's Name: Allison [name]  Date of Birth: 12-09-1985  Medical Records Number: [id number]  Date of Operation: 07/03/2012  Preoperative diagnosis:  [redacted]w[redacted]d weeks gestation  Non-Reassuring Fetal Heart Rate  Preeclampsia  Postoperative diagnosis:  [redacted]w[redacted]d weeks gestation  Non-Reassuring Fetal Heart Rate  Preeclampsia  Procedure:  Primary low transverse cesarean section  Surgeon:  Leonard Schwartz, M.D.  Assistant:  Paulino Door, certified nurse midwife  Anesthesia:  Epidural  Disposition:  Allison [name] is a 26 y.o. female, G2P1001, who presents at [redacted]w[redacted]d weeks gestation. The patient has been followed at the [facility] obstetrics and gynecology division of [facility] health care for women. She has the above mentioned diagnosis.  Her labor was induced because of preeclampsia. Her baby did not tolerate contractions. A cesarean section was recommended. She understands the indications for her procedure and she accepts the risk of, but not limited to, anesthetic complications, bleeding, infections, and possible damage to the surrounding organs.  Findings:  A female Onalee Hua) was delivered from a occiput transverse position.  The Apgar scores were 7/8. The arterial cord blood pH was 7.28 . The uterus, fallopian tubes, and ovaries were normal for the gravid state.  Procedure:  The patient was taken to the operating room where a spinal anesthetic was given. The patient's abdomen was prepped with Doraprep. A Foley catheter was placed in the bladder previously. The patient was sterilely draped. The lower abdomen was injected with half percent Marcaine with epinephrine. A low transverse incision was made in the abdomen and carried sharply through the subcutaneous tissue, the fascia, and the anterior peritoneum. An incision was made in the lower uterine segment. The incision was extended in a low transverse fashion. The membranes were ruptured. The  fetal head was delivered without difficulty. The mouth and nose were suctioned. The remainder of the infant was then delivered. The cord was clamped and cut. The infant was handed to the awaiting pediatric team. The placenta was removed. The uterine cavity was cleaned of amniotic fluid, clotted blood, and membranes. The uterine incision was closed using a running locking suture of 2-0 Vicryl. An imbricating suture of 2-0 Vicryl was placed. The pelvis was vigorously irrigated. Hemostasis was adequate. The anterior peritoneum and the abdominal musculature were closed using 2-0 Vicryl. The fascia was closed using a running suture of 0 Vicryl followed by 3 interrupted sutures of 0 Vicryl. The subcutaneous layer was closed using interrupted sutures of 2-0 Vicryl. The skin was reapproximated using a subcuticular suture of 3-0 Monocryl. Sponge, needle, and instrument counts were correct on 2 occasions. The estimated blood loss for the procedure was 800 cc. The patient tolerated her procedure well. She was transported to the recovery room in stable condition. The infant was taken to the full-term nursery in stable condition. The placenta was sent to labor and delivery.  Leonard Schwartz, M.D.

## 2012-07-03 NOTE — Progress Notes (Signed)
Spoke with Dr. Rodman Pickle regarding BP. At this time will cont to monitor BP. FHR reassuring. Will treat BP if low and FHR nonreassuring.

## 2012-07-03 NOTE — Progress Notes (Signed)
The patient continues to headache (Frontal) - getting some relief from Fentanyl 2 mgs IV 1 -2 hrly O VSS BP 129/62  Pulse 80  Temp 97.6 F (36.4 C) (Axillary)  Resp 16  Ht 5' 6.5" (1.689 m)  Wt 304 lb (137.893 kg)  BMI 48.33 kg/m2  SpO2 99%  LMP 09/15/2011      FHT: the patient has an epiisode of repetitive variables @ 10.25hr and had sonpanteous recovery. Moderate variability    With baseline 135 bpm. FSE placed. S/p FHT run of 3  Late decelarations with spontaneous recovery to baseline 135 bpm. PItocin is now 1/2 to allow the fetus to have recovery. Position change to ER Lateral as baby really does not like Lt lateral position Considering Epidural Anesthesia at present      UC pattern : 1: 1 -2 mins      Abdomen soft between contractions     SVE: 3/90/0 - slight change in effacement  A: PIH with superimposed Pre E , Magnesium Sulfate Tx  Collaboration with Dr Stefano Gaul on management. P: continue care, possible epidural as desired.  [name], CNM.

## 2012-07-03 NOTE — Progress Notes (Signed)
Comfortable, with epidural O VSS BP 115/44  Pulse 90  Temp 97.6 F (36.4 C) (Axillary)  Resp 20  Ht 5' 6.5" (1.689 m)  Wt 304 lb (137.893 kg)  BMI 48.33 kg/m2  SpO2 98%  LMP 09/15/2011       fhts category 1 with pitocin off since 15.19 hrs      abd soft between uc      Contractions  Now spaced to 1 : 5 - 6 mins , mild      Vag 3/90/-2      Dr Stefano Gaul informed of fetal intolerance of labor with Pitocin. Pitocin off at present.     S/P amnio-infusion.     Dr Stefano Gaul has seen the patient with view to PLTCS, Consent and review of procedure and risks of surgery to      mother and baby. A    Fetal intolerance of labor P    Prep for PLTCS  [name], CNM.

## 2012-07-04 ENCOUNTER — Encounter (HOSPITAL_COMMUNITY): Payer: Self-pay | Admitting: Obstetrics and Gynecology

## 2012-07-04 LAB — MAGNESIUM: Magnesium: 4.2 mg/dL — ABNORMAL HIGH (ref 1.5–2.5)

## 2012-07-04 LAB — CBC WITH DIFFERENTIAL/PLATELET
Eosinophils Absolute: 0.1 10*3/uL (ref 0.0–0.7)
Eosinophils Relative: 1 % (ref 0–5)
HCT: 34.2 % — ABNORMAL LOW (ref 36.0–46.0)
Lymphocytes Relative: 12 % (ref 12–46)
Lymphs Abs: 1.3 10*3/uL (ref 0.7–4.0)
MCH: 29.3 pg (ref 26.0–34.0)
MCV: 89.5 fL (ref 78.0–100.0)
Monocytes Absolute: 0.6 10*3/uL (ref 0.1–1.0)
Platelets: 118 10*3/uL — ABNORMAL LOW (ref 150–400)
RBC: 3.82 MIL/uL — ABNORMAL LOW (ref 3.87–5.11)
RDW: 15.4 % (ref 11.5–15.5)
WBC: 10.9 10*3/uL — ABNORMAL HIGH (ref 4.0–10.5)

## 2012-07-04 LAB — COMPREHENSIVE METABOLIC PANEL
ALT: 16 U/L (ref 0–35)
AST: 35 U/L (ref 0–37)
Alkaline Phosphatase: 86 U/L (ref 39–117)
CO2: 24 mEq/L (ref 19–32)
GFR calc Af Amer: >90 mL/min (ref >90)
GFR calc non Af Amer: >90 mL/min (ref >90)
Glucose, Bld: 94 mg/dL (ref 70–99)
Potassium: 4.2 mEq/L (ref 3.5–5.1)
Sodium: 133 mEq/L — ABNORMAL LOW (ref 135–145)

## 2012-07-04 LAB — RPR: RPR Ser Ql: NONREACTIVE

## 2012-07-04 MED ORDER — LACTATED RINGERS IV BOLUS (SEPSIS)
500.0000 mL | Freq: Once | INTRAVENOUS | Status: AC
  Administered 2012-07-04: 500 mL via INTRAVENOUS

## 2012-07-04 MED ORDER — HYDROMORPHONE HCL 2 MG PO TABS
2.0000 mg | ORAL_TABLET | ORAL | Status: DC | PRN
  Administered 2012-07-04 – 2012-07-06 (×10): 2 mg via ORAL
  Filled 2012-07-04 (×11): qty 1

## 2012-07-04 NOTE — Progress Notes (Signed)
Post Partum Day 1 after c/s for pre-eclampsia and fetal intolerance of labor Subjective: pain management improved with Dilaudid.  No HA ,SOB  Objective: Blood pressure 147/62, pulse 101, temperature 98.2 F (36.8 C), temperature source Oral, resp. rate 18, height 5' 6.5" (1.689 m), weight 304 lb (137.893 kg), last menstrual period 09/15/2011, SpO2 95.00%, unknown if currently breastfeeding.  Physical Exam:  General: alert, cooperative, appears stated age, fatigued and morbidly obese Lochia: appropriate Uterine Fundus: appropriately tender Incision: dressing intact Extremities: 2+ edema nontender   Basename 07/04/12 0510 07/03/12 1949  HGB 11.2* 12.2  HCT 34.2* 37.1  Urine output was about 40cc per hour until fluid bolus this afternoon when pt responded with 600cc urine output.  Since then the pt has had 230 cc output. Magnesium d/c'd at 8 pm and only 30cc urine output since.  BP is stable but pulse is 101.  Assessment/Plan: Postpartum pre-eclampsia s/p 24 hours magnesium therapy with good BP control, but borderline urine output, probably secondary to intravascular fluid deficit.  PLAN: Give fluid bolus now and continue IV hydration through the night. Recheck labs in the morning Transfer to MBU   LOS: 2 days   Hula Tasso P 07/04/2012, 11:28 PM

## 2012-07-04 NOTE — Progress Notes (Signed)
Subjective: Postpartum Day 1: Cesarean Delivery due to fetal intolerance to labor, pre-eclampsia Patient reports doing well.  Denies HA, visual symptoms, epigastric pain.   Feeding:  Breast Contraceptive:  Undecided  On magnesium sulfate since delivery at 6:27p yesterday.  Objective: Vital signs in last 24 hours: Temp:  [97.5 F (36.4 C)-98.7 F (37.1 C)] 98.2 F (36.8 C) (09/27 0822) Pulse Rate:  [72-117] 86  (09/27 0822) Resp:  [15-30] 18  (09/27 0702) BP: (93-160)/(44-100) 128/66 mmHg (09/27 0822) SpO2:  [94 %-100 %] 95 % (09/27 0820)  Filed Vitals:   07/04/12 0606 07/04/12 0702 07/04/12 0820 07/04/12 0822  BP:  105/62  128/66  Pulse:  84  86  Temp:    98.2 F (36.8 C)  TempSrc:    Oral  Resp: 20 18    Height:      Weight:      SpO2: 95%  95%    24 hour I/O balance = +5416 Urine output 2470 cc last 24 hours  Physical Exam:  General: alert Chest clear Heart RRR without murmur Lochia: appropriate Uterine Fundus: firm Incision: Dressing CDI DVT Evaluation: No evidence of DVT seen on physical exam. Negative Homan's sign. Calf/Ankle edema is present, 1+. DTR 2+ without clonus. JP drain:   NA   Basename 07/04/12 0510 07/03/12 1949  HGB 11.2* 12.2  HCT 34.2* 37.1    Assessment/Plan: Status post Cesarean section due to [facility]. Pre-eclampsia Will maintain Magnesium for 24 hours, then d/c and observe BP for 4 hours--transfer out if AICU bed becomes available.  Nigel Bridgeman 07/04/2012, 9:59 AM

## 2012-07-04 NOTE — Addendum Note (Signed)
Addendum  created 07/04/12 1423 by Gertie Fey, CRNA   Modules edited:Notes Section

## 2012-07-04 NOTE — Anesthesia Postprocedure Evaluation (Signed)
  Anesthesia Post-op Note  Patient: Allison [name]  Procedure(s) Performed: Procedure(s) (LRB) with comments: CESAREAN SECTION (N/A)  Patient Location: PACU and Mother/Baby  Anesthesia Type: Epidural  Level of Consciousness: awake, alert  and oriented  Airway and Oxygen Therapy: Patient Spontanous Breathing  Post-op Pain: none  Post-op Assessment: Post-op Vital signs reviewed  Post-op Vital Signs: Reviewed and stable  Complications: No apparent anesthesia complications 

## 2012-07-04 NOTE — Addendum Note (Signed)
Addendum  created 07/04/12 1426 by Gertie Fey, CRNA   Modules edited:Notes Section

## 2012-07-04 NOTE — Anesthesia Postprocedure Evaluation (Signed)
  Anesthesia Post-op Note  Patient: Allison [name]  Procedure(s) Performed: Procedure(s) (LRB) with comments: CESAREAN SECTION (N/A)  Patient Location: PACU and Mother/Baby  Anesthesia Type: Epidural  Level of Consciousness: awake, alert  and oriented  Airway and Oxygen Therapy: Patient Spontanous Breathing  Post-op Pain: none  Post-op Assessment: Post-op Vital signs reviewed  Post-op Vital Signs: Reviewed and stable  Complications: No apparent anesthesia complications

## 2012-07-05 LAB — COMPREHENSIVE METABOLIC PANEL
ALT: 14 U/L (ref 0–35)
Albumin: 2.2 g/dL — ABNORMAL LOW (ref 3.5–5.2)
Alkaline Phosphatase: 71 U/L (ref 39–117)
Calcium: 8.7 mg/dL (ref 8.4–10.5)
GFR calc Af Amer: >90 mL/min (ref >90)
Glucose, Bld: 81 mg/dL (ref 70–99)
Potassium: 4.1 mEq/L (ref 3.5–5.1)
Sodium: 136 mEq/L (ref 135–145)
Total Protein: 5.3 g/dL — ABNORMAL LOW (ref 6.0–8.3)

## 2012-07-05 LAB — CULTURE, BETA STREP (GROUP B ONLY)

## 2012-07-05 MED ORDER — LABETALOL HCL 5 MG/ML IV SOLN
10.0000 mg | Freq: Once | INTRAVENOUS | Status: AC
  Administered 2012-07-05: 10 mg via INTRAVENOUS
  Filled 2012-07-05: qty 4

## 2012-07-05 MED ORDER — LACTATED RINGERS IV SOLN
INTRAVENOUS | Status: DC
  Administered 2012-07-05: 04:00:00 via INTRAVENOUS

## 2012-07-05 MED ORDER — LACTATED RINGERS IV BOLUS (SEPSIS): 500.0000 mL | Freq: Once | INTRAVENOUS | Status: DC

## 2012-07-05 MED ORDER — HYDROCHLOROTHIAZIDE 25 MG PO TABS
25.0000 mg | ORAL_TABLET | Freq: Every day | ORAL | Status: DC
  Administered 2012-07-06: 25 mg via ORAL
  Filled 2012-07-05: qty 1

## 2012-07-05 MED ORDER — NIFEDIPINE ER 30 MG PO TB24
30.0000 mg | ORAL_TABLET | Freq: Every day | ORAL | Status: DC
  Administered 2012-07-05 – 2012-07-06 (×2): 30 mg via ORAL
  Filled 2012-07-05 (×2): qty 1

## 2012-07-05 MED ORDER — FUROSEMIDE 10 MG/ML IJ SOLN
40.0000 mg | Freq: Once | INTRAMUSCULAR | Status: AC
  Administered 2012-07-05: 40 mg via INTRAVENOUS
  Filled 2012-07-05: qty 4

## 2012-07-05 NOTE — Clinical Social Work Note (Signed)
Clinical Social Work Department PSYCHOSOCIAL ASSESSMENT - MATERNAL/CHILD 07/05/2012  Patient:  DOLOREZ, FRAGALE  Account Number:  [id number]  Admit Date:  07/02/2012  Marjo Bicker Name:   Gwynneth Macleod    Clinical Social Worker:  Truman Hayward, LCSW   Date/Time:  07/05/2012 11:00 AM  Date Referred:  07/05/2012   Referral source  Physician     Referred reason  Substance Abuse  Depression/Anxiety   Other referral source:    I:  FAMILY / HOME ENVIRONMENT Child's legal guardian:  PARENT  Guardian - Name Guardian - Age Guardian - Address  Kemoria Macfadyen [address] [place], [place] [address]  Mena Pauls  [address] [place], [place] [address]   Other household support members/support persons Name Relationship DOB  no other children in home     Other support:   MOB and FOB report lots of family support in the area.    II  PSYCHOSOCIAL DATA Information Source:  Patient Interview  Event organiser Employment:   Financial resources:  [employer] If Medicaid - [employer]:  [place] Other  WIC   School / Grade:   Maternity Care Coordinator / Child Services Coordination / Early Interventions:  Cultural issues impacting care:    III  STRENGTHS Strengths  Compliance with medical plan  Home prepared for Child (including basic supplies)  Supportive family/friends  Adequate Resources   Strength comment:    IV  RISK FACTORS AND CURRENT PROBLEMS Current Problem:  None   Risk Factor & Current Problem Patient Issue Family Issue Risk Factor / Current Problem Comment   N N     V  SOCIAL WORK ASSESSMENT CSW spoke with MOB and FOB in room.  Discussed emotional state with MOB.  MOB reports no current concerns with symptoms of anxiety or depression. MOB currently is on medication management for depression and anxiety (stayed on Celexa throughout the pregnancy, and plans to start back Wellbutrin if needed as that was discontinued during the pregnancy).  MOB also sees Dr.  Ledon Snare in the community once a month for counseling services.  CSW discussed hx of SA.  MOB reports before she knew she was pregnant she was on a anniversary trip with spouse and while in [place] smoke MJ.  MOB reports this was only on the trip and she is not used since.  UDS neg, awaiting MEC results.  CSW discussed hospital policy for drug screen and MOB and FOB were understanding.  CSW discussed any concerns with supplies or support.  MOB and FOB report no current concerns and they have lots of family support in the area. MOB reports no concerns with medicaid and financial support.  No other indicators at this time.  Please reconsult CSW if any further concerns arise.      VI SOCIAL WORK PLAN Social Work Plan  No Further Intervention Required / No Barriers to Discharge   Type of pt/family education:   If child protective services report - county:   If child protective services report - date:   Information/referral to community resources comment:   Other social work plan:

## 2012-07-05 NOTE — Progress Notes (Signed)
Post Partum Day 2 from cesarean section for fetal intolerance of labor with preeclampsia Subjective: up ad lib, voiding, tolerating PO, + flatus and Complains of significant pedal edema. Has gotten acceptable pain relief from Tylox.  Objective: Blood pressure 146/81, pulse 79, temperature 98.2 F (36.8 C), temperature source Oral, resp. rate 18, height 5' 6.5" (1.689 m), weight 316 lb 9.6 oz (143.609 kg) SpO2 99.00% I/O last 3 completed shifts: In: 6849.1 [P.O.:1260; I.V.:5589.1] Out: 2775 [Urine:2775] Total I/O In: 560 [I.V.:560] Out: 125 [Urine:125]   Physical Exam:  General: alert, cooperative, appears stated age, mild distress and morbidly obese Lochia: appropriate Uterine Fundus: firm Incision: Dressing dry and intact DVT Evaluation: No evidence of DVT seen on physical exam. No cords or calf tenderness. Calf/Ankle edema is present.   Basename 07/04/12 0510 07/03/12 1949  HGB 11.2* 12.2  HCT 34.2* 37.1    Assessment/Plan: 1) urine output improved last night after fluid resumption. 2) 12 pound weight gain since admission reflecting continued fluid retention 3) postpartum preeclampsia status post magnesium sulfate therapy for 24 hours postpartum, now with increasing blood pressures  Plan: Single dose of Lasix for diuresis today Begin HCTZ 25 mg daily tomorrow and plan discharge home on that dose Any additional blood pressure management that is required will be done with Procardia XL Anticipate discharge home on 07/06/2012    LOS: 3 days   HAYGOOD,VANESSA P 07/05/2012, 10:01 AM

## 2012-07-06 MED ORDER — INFLUENZA VIRUS VACC SPLIT PF IM SUSP
0.5000 mL | INTRAMUSCULAR | Status: AC
  Administered 2012-07-06: 0.5 mL via INTRAMUSCULAR

## 2012-07-06 MED ORDER — NIFEDIPINE ER 30 MG PO TB24
30.0000 mg | ORAL_TABLET | ORAL | Status: AC
  Administered 2012-07-06: 30 mg via ORAL
  Filled 2012-07-06: qty 1

## 2012-07-06 MED ORDER — NIFEDIPINE ER 60 MG PO TB24: 60.0000 mg | ORAL_TABLET | Freq: Every day | ORAL | Status: DC

## 2012-07-06 MED ORDER — OXYCODONE-ACETAMINOPHEN 5-325 MG PO TABS: 1.0000 | ORAL_TABLET | ORAL | Status: DC | PRN

## 2012-07-06 MED ORDER — IBUPROFEN 600 MG PO TABS: 600.0000 mg | ORAL_TABLET | Freq: Four times a day (QID) | ORAL | Status: DC | PRN

## 2012-07-06 MED ORDER — HYDROCHLOROTHIAZIDE 25 MG PO TABS: 25.0000 mg | ORAL_TABLET | Freq: Every day | ORAL | Status: DC

## 2012-07-06 NOTE — Discharge Summary (Signed)
Obstetric Discharge Summary Reason for Admission: induction of labor due to elevated BP Prenatal Procedures: NST and ultrasound Intrapartum Procedures: cesarean: low cervical, transverse Postpartum Procedures: 24 hours magnesium sulfate after delivery, Procardia 60 mg XL daily, HCTZ 25 mg po q day x 7 day. Complications-Operative and Postpartum: none Hemoglobin  Date Value Range Status  07/04/2012 11.2* 12.0 - 15.0 g/dL Final     HCT  Date Value Range Status  07/04/2012 34.2* 36.0 - 46.0 % Final   Hospital Course:  Admitted 07/02/12 for induction due to elevated BP. Negative GBS. Magnesium sulfate was begun, and pitocin was initiated.  Fetus demonstrated a recurrent intolerance to labor and the patient was consented for C/S on  07/03/12.Marland Kitchen Utilized spinal for anesthesia. Delivery was performed by Dr. Stefano Gaul via LT C/S without complication. Patient and baby tolerated the procedure without difficulty.  Infant status was stable and remained in room with mother.  Mother and infant then had an uncomplicated postpartum course, with breast feeding going well. Mom's physical exam was WNL, and she was discharged home in stable condition. Contraception plan was undecided at present, but considering Mirena.  She received adequate benefit from po pain medications, using Motrin and Ultram.  She was begun on Procardia 30 mg XL for BP treatment, with increase to 60 mg XL daily on the day of discharge and addition of HCTZ 25 mg po q day x 7 days to d/c plan.  [facility] nurse to see patient mid-week for BP re-evaluation.      Physical Exam:  General: alert Lochia: appropriate Uterine Fundus: firm Incision: healing well DVT Evaluation: No evidence of DVT seen on physical exam. Negative Homan's sign. Calf/Ankle edema is present.  Discharge Diagnoses: Term Pregnancy-delivered and pre-eclampsia, primary LT C/S due to fetal intolerance to labor  Discharge Information: Date: 07/06/2012 Activity: Per [facility]  handout Diet: routine Medications: Ibuprofen, Percocet and Procardia 60 mg XL daily, HCTZ 25 mg po q day x 1 week. Condition: stable Instructions: refer to practice specific booklet Discharge to: home Contraception:  Undecided at present--considering Mirena Follow-up Information    Follow up with [facility]. Schedule an appointment as soon as possible for a visit in 5 weeks. (Call with any questions or concerns)    Contact information:   [address] [place] [address] [phone]         Newborn Data: Live born female  Birth Weight: 5 lb 6.4 oz (2450 g) APGAR: 7, 8  Home with mother.  Nigel Bridgeman 07/06/2012, 10:21 AM

## 2012-07-07 ENCOUNTER — Encounter (HOSPITAL_COMMUNITY): Payer: Self-pay | Admitting: *Deleted

## 2012-07-07 ENCOUNTER — Inpatient Hospital Stay (HOSPITAL_COMMUNITY)
Admission: AD | Admit: 2012-07-07 | Discharge: 2012-07-07 | Disposition: A | Discharge status: Home / Self Care | Payer: Medicaid Other | Source: Ambulatory Visit | Attending: Obstetrics and Gynecology | Admitting: Obstetrics and Gynecology

## 2012-07-07 DIAGNOSIS — IMO0001 Reserved for inherently not codable concepts without codable children: Secondary | ICD-10-CM | POA: Insufficient documentation

## 2012-07-07 DIAGNOSIS — R51 Headache: Secondary | ICD-10-CM | POA: Insufficient documentation

## 2012-07-07 DIAGNOSIS — O99893 Other specified diseases and conditions complicating puerperium: Secondary | ICD-10-CM | POA: Insufficient documentation

## 2012-07-07 LAB — TYPE AND SCREEN
Antibody Screen: NEGATIVE
Unit division: 0

## 2012-07-07 LAB — CBC
Hemoglobin: 11.7 g/dL — ABNORMAL LOW (ref 12.0–15.0)
Platelets: 175 10*3/uL (ref 150–400)
RBC: 3.96 MIL/uL (ref 3.87–5.11)
WBC: 8.8 10*3/uL (ref 4.0–10.5)

## 2012-07-07 LAB — COMPREHENSIVE METABOLIC PANEL
ALT: 54 U/L — ABNORMAL HIGH (ref 0–35)
AST: 141 U/L — ABNORMAL HIGH (ref 0–37)
Alkaline Phosphatase: 108 U/L (ref 39–117)
CO2: 27 mEq/L (ref 19–32)
Chloride: 96 mEq/L (ref 96–112)
GFR calc Af Amer: >90 mL/min (ref >90)
GFR calc non Af Amer: >90 mL/min (ref >90)
Glucose, Bld: 83 mg/dL (ref 70–99)
Sodium: 135 mEq/L (ref 135–145)
Total Bilirubin: 0.4 mg/dL (ref 0.3–1.2)

## 2012-07-07 NOTE — Progress Notes (Signed)
Post discharge chart review completed.  

## 2012-07-07 NOTE — MAU Provider Note (Signed)
History     CSN: [id number]  Arrival date and time: 07/07/12 1559   None     Chief Complaint  Patient presents with  . Headache  . Hypertension   HPI Comments: Pt is a G1P1 s/p 4days after primary c/s for pre-eclampsia, she rcv'd while in labor and for 24 hours after delivery. She states she had a bad headache earlier, is now improved. She was worried about her BP.   Patient is a 26 y.o. female presenting with headaches and hypertension.  Headache The primary symptoms include headaches. Primary symptoms do not include nausea or vomiting.  The headache is not associated with double vision.  Medical issues also include hypertension.  Hypertension Associated symptoms include headaches. Pertinent negatives include no chest pain, nausea or vomiting.      Past Medical History  Diagnosis Date  . Asthma   . Allergy   . Hyperlipidemia   . UTI (lower urinary tract infection)   . Migraine   . Chicken pox   . Smoker   . Depression     In counseling with Dr. Ledon Snare in [place]  . Hypertension   . Pregnancy induced hypertension     Past Surgical History  Procedure Date  . Tonsillectomy   . Wisdom tooth extraction 2007  . Cesarean section 07/03/2012    Procedure: CESAREAN SECTION;  Surgeon: Kirkland Hun, MD;  Location: [facility];  Service: Obstetrics;  Laterality: N/A;    Family History  Problem Relation Age of Onset  . Hypertension Mother   . Depression Mother   . Hypothyroidism Mother   . Hypertension Father   . Depression Other   . Heart disease Paternal Aunt   . Cancer Maternal Grandfather     colon cancer  . Mental illness Sister   . Hypothyroidism Brother   . Alcohol abuse Brother   . Hypothyroidism Maternal Grandmother   . Hypertension Paternal Grandmother     History  Substance Use Topics  . Smoking status: Former Smoker -- 1.0 packs/day for 8 years    Types: Cigarettes    Quit date: 11/09/2011  . Smokeless tobacco: Never Used  . Alcohol Use: 0.5  oz/week    1 drink(s) per week     unknown amount    Allergies: No Known Allergies  No prescriptions prior to admission    Review of Systems  Eyes: Negative for blurred vision and double vision.  Respiratory: Negative for shortness of breath.   Cardiovascular: Negative for chest pain.  Gastrointestinal: Negative for heartburn, nausea and vomiting.  Neurological: Positive for headaches.  All other systems reviewed and are negative.   Physical Exam   Blood pressure 141/90, pulse 107, temperature 97.7 F (36.5 C), temperature source Oral, resp. rate 18, height 5' 6.5" (1.689 m), weight 313 lb (141.976 kg), last menstrual period 09/15/2011, currently breastfeeding.  Physical Exam  Nursing note and vitals reviewed. Constitutional: She is oriented to person, place, and time. She appears well-developed and well-nourished.  HENT:  Head: Normocephalic.  Eyes: Pupils are equal, round, and reactive to light.  Neck: Normal range of motion.  Cardiovascular: Normal rate, regular rhythm and normal heart sounds.   Respiratory: Effort normal and breath sounds normal.  GI: Soft. Bowel sounds are normal.  Musculoskeletal: Normal range of motion. She exhibits no edema.       Mild LEE, per pt has improved   Neurological: She is alert and oriented to person, place, and time. She has normal reflexes.  Skin: Skin  is warm and dry.  Psychiatric: She has a normal mood and affect. Her behavior is normal.    MAU Course  Procedures    Assessment and Plan  S/p c/s 4 days ago Is on 60mg  procardia and 25mg  HCTZ  PIH labs WNL  Will check to make sure smart start RN will come check BP on Wednesday  D/c home Tylenol as needed for HA's Call for worsening sx's   Kameryn Davern M 07/07/2012, 10:17 PM

## 2012-07-07 NOTE — MAU Note (Signed)
Pt was discharged yesterday after C/S on newborn.  BP 170/80 at home with some vision changes.

## 2012-07-08 ENCOUNTER — Telehealth: Payer: Self-pay | Admitting: Obstetrics and Gynecology

## 2012-07-08 NOTE — Telephone Encounter (Signed)
VM from [facility]. Pt was seen last PM at MAU for headache.  Is 4 d PP. On Procardia and HCTZ. Needs appt 07/09/12 to F/U BP.CHECK 07/09/12. TC to pt. Scheduled with VL 07/09/12. Pt states is feeling OK now.  To call with any concerns.  Pt verbalizes comprehension.

## 2012-07-09 ENCOUNTER — Ambulatory Visit (INDEPENDENT_AMBULATORY_CARE_PROVIDER_SITE_OTHER): Payer: Medicaid Other | Admitting: Obstetrics and Gynecology

## 2012-07-09 ENCOUNTER — Encounter: Payer: Self-pay | Admitting: Obstetrics and Gynecology

## 2012-07-09 VITALS — BP 130/90 | Ht 67.0 in | Wt 292.0 lb

## 2012-07-09 DIAGNOSIS — IMO0002 Reserved for concepts with insufficient information to code with codable children: Secondary | ICD-10-CM

## 2012-07-09 DIAGNOSIS — O1490 Unspecified pre-eclampsia, unspecified trimester: Secondary | ICD-10-CM | POA: Insufficient documentation

## 2012-07-09 MED ORDER — OXYCODONE-ACETAMINOPHEN 5-325 MG PO TABS: 1.0000 | ORAL_TABLET | ORAL | Status: AC | PRN

## 2012-07-09 MED ORDER — OXYCODONE-ACETAMINOPHEN 5-325 MG PO TABS: 1.0000 | ORAL_TABLET | ORAL | Status: DC | PRN

## 2012-07-09 NOTE — Progress Notes (Signed)
Here for BP check.  Delivered by primary LTCS on 07/03/12 due to fetal tolerance to labor, pre-eclampsia.  Initially had gestational hypertension, then developed scotomata, mild change in ALT--dx with pre-eclampsia and started on magnesium sulfate prior to delivery. D/C'd home on Procardia 60 mg XL and HCTZ 25 mg po x 1 week.  Seen in MAU on 9/30 for HA--BP 140/90, wt 313, improved edema, normal PIH labs.  Today:  BP at home yesterday 160/100. Minimal edema.  Weight 292 (nearly 20 lbs less than 9/30). No HA, visual sx, or epigastric pain. Some hot flashes, occasional "swimmy headed", but this is usually associated with elevated BP.  Consulted with AR:   Increase Procardia to 90 mg XL--start tomorrow. Precautions given--as BP decreases, may note HAs. Stop HCTZ, since fluid balance is improved. [facility] to see patient on Friday for BP check.

## 2012-07-10 ENCOUNTER — Telehealth: Payer: Self-pay | Admitting: Obstetrics and Gynecology

## 2012-07-10 NOTE — Telephone Encounter (Signed)
TC to [place]. [facility] coordinator. LM regarding repeat BP check 07/11/12.

## 2012-07-10 NOTE — Telephone Encounter (Signed)
Message copied by Mason Jim on Thu Jul 10, 2012  8:30 AM ------      Message from: Cornelius Moras      Created: Wed Jul 09, 2012  4:41 PM      Regarding: [facility] visit       Need [facility] visit on Friday for BP check--increased Procardia to 90 mg XL today.            VL

## 2012-07-11 ENCOUNTER — Telehealth: Payer: Self-pay | Admitting: Obstetrics and Gynecology

## 2012-07-11 NOTE — Telephone Encounter (Signed)
Message copied by Mason Jim on Fri Jul 11, 2012  9:15 AM ------      Message from: Cornelius Moras      Created: Wed Jul 09, 2012  4:41 PM      Regarding: [facility] visit       Need [facility] visit on Friday for BP check--increased Procardia to 90 mg XL today.            VL

## 2012-07-11 NOTE — Telephone Encounter (Signed)
REcevied phone call from Rosey Bath 07/10/12 confirming will notify [facility]. About BP check.

## 2012-07-30 ENCOUNTER — Other Ambulatory Visit: Payer: Self-pay

## 2012-07-30 NOTE — Telephone Encounter (Signed)
.  left message to have patient return my call. Pt now has medicaid would she re-start this thru her GYN? She has appt 08/13/2012.

## 2012-07-30 NOTE — Telephone Encounter (Signed)
What are her other current meds, is she breastfeeding, how is her mood?

## 2012-07-30 NOTE — Telephone Encounter (Signed)
Pt had stopped wellbutrin due to pregnancy. Pt had baby 07/03/12 and would like to restart wellbutrin. [facility] [place].Please advise.

## 2012-08-04 NOTE — Telephone Encounter (Signed)
Okay to restart now at 150mg  of the SR daily for 3 days and then increase to 150mg  po bid as needed/as tolerated.  If she wants [place] to fill it, she'll need to schedule fu here in a few weeks (sooner if needed).  O/w I would the have ob/gyn clinic address this.    If she desires [place] to fill it, please send in and schedule.  O/w cancel the source rx.  Thanks .

## 2012-08-04 NOTE — Telephone Encounter (Signed)
Pt called back; she is on pregnancy medicaid now; not [place] access; no other insurance. Pt said she is taking Nisedipine estended release 30 mg one daily; Nisedical XL 60 mg one daily and Lexapro 40 mg one daily. Pt is not breast feeding. Pt said whe is low energy; her mood is pretty good while on Lexapro but feels anxious.Please advise.

## 2012-08-05 NOTE — Telephone Encounter (Signed)
LMOVM to return call.

## 2012-08-07 NOTE — Telephone Encounter (Signed)
LMOVM  In detail.  Permission given in chart.

## 2012-08-13 ENCOUNTER — Encounter: Payer: Self-pay | Admitting: Obstetrics and Gynecology

## 2012-08-13 ENCOUNTER — Ambulatory Visit (INDEPENDENT_AMBULATORY_CARE_PROVIDER_SITE_OTHER): Payer: Medicaid Other | Admitting: Obstetrics and Gynecology

## 2012-08-13 VITALS — BP 130/82 | Wt 295.0 lb

## 2012-08-13 DIAGNOSIS — F419 Anxiety disorder, unspecified: Secondary | ICD-10-CM

## 2012-08-13 DIAGNOSIS — F411 Generalized anxiety disorder: Secondary | ICD-10-CM

## 2012-08-13 MED ORDER — BUPROPION HCL ER (XL) 300 MG PO TB24: 300.0000 mg | ORAL_TABLET | Freq: Every day | ORAL | Status: DC

## 2012-08-13 NOTE — Progress Notes (Signed)
Allison [name] is a 26 y.o. female who presents for a postpartum visit.   Type of delivery:  Primary LTCS due to NRFHR, pre-eclampsia.  On Procardia 90 mg XL until approx 2 weeks ago.  No HA, visual symptoms, or epigastric pain.  Patient reports doing very well, with no complications of healing.  Wants to restart Wellbutrin--stopped with pregnancy.  Hx anxiety--has restarted Celexa already.  Hx remarkable for: Patient Active Problem List  Diagnosis  . Depression  . Smoker  . Migraine  . Obesity  . HLD (hyperlipidemia)  . Drug abuse  . PIH (pregnancy induced hypertension)  . Preterm labor  . Status post primary low transverse cesarean section--fetal intolerance to labor, pre-eclampsia  . Pre-eclampsia      PPDS = 5, denies PP depression, SI or HI.  Contraception plan:  Wants Nexplanon   I have fully reviewed the prenatal and intrapartum course   Patient has not been sexually active since delivery.   The following portions of the patient's history were reviewed and updated as appropriate: allergies, current medications, past family history, past medical history, past social history, past surgical history and problem list.  Review of Systems Pertinent items are noted in HPI.   Objective:    BP 130/82  Wt 295 lb (133.811 kg)  LMP 08/08/2012  Breastfeeding? No  General:  alert, cooperative and no distress     Lungs: clear to auscultation bilaterally  Heart:  regular rate and rhythm, S1, S2 normal, no murmur  Abdomen: soft, non-tender; bowel sounds normal; no masses,  no organomegaly.   Incision:  Well-healed LTCS incision   Vulva:  normal  Vagina: normal vagina  Cervix:  normal  Uterus: normal size, contour, position, consistency, mobility, non-tender, well-involuted  Adnexa:  normal adnexa             Assessment:     Normal postpartum exam.  Pap smear:   not done at today's visit.  Due 12/2012.  Plan:  Follow-up before 11/30 for Nexplanon  insertion. Restart Wellbutrin 300 mg XL q day (previous dose).   Nigel Bridgeman CNM, [place] 08/13/2012 6:49 PM

## 2012-08-13 NOTE — Progress Notes (Signed)
Date of delivery: 07/03/2012 Female Name: Allison [name]  Vaginal delivery:no Cesarean section:yes Tubal ligation:no GDM:no Breast Feeding:no Bottle Feeding:yes Post-Partum Blues:no Abnormal pap:no Normal GU function: yes Normal GI function:yes Returning to work:no EPDS: 5 Pt wants to discuss b/c at today's visit.

## 2012-08-29 ENCOUNTER — Ambulatory Visit (INDEPENDENT_AMBULATORY_CARE_PROVIDER_SITE_OTHER): Payer: Medicaid Other | Admitting: Obstetrics and Gynecology

## 2012-08-29 ENCOUNTER — Encounter: Payer: Self-pay | Admitting: Obstetrics and Gynecology

## 2012-08-29 VITALS — BP 132/88 | Resp 16 | Wt 299.0 lb

## 2012-08-29 DIAGNOSIS — Z309 Encounter for contraceptive management, unspecified: Secondary | ICD-10-CM

## 2012-08-29 NOTE — Progress Notes (Signed)
Pt had unprotected intercourse in the past 2 weeks. Will reschedule Nexplanon insertion  Silverio Lay MD 11/22/20131:29 PM

## 2014-08-09 ENCOUNTER — Encounter: Payer: Self-pay | Admitting: Obstetrics and Gynecology

## 2014-10-08 DIAGNOSIS — Z8709 Personal history of other diseases of the respiratory system: Secondary | ICD-10-CM

## 2014-10-08 HISTORY — DX: Personal history of other diseases of the respiratory system: Z87.09

## 2014-12-21 ENCOUNTER — Ambulatory Visit: Payer: Medicaid Other | Admitting: Dietician

## 2015-01-13 ENCOUNTER — Other Ambulatory Visit: Payer: Self-pay | Admitting: General Surgery

## 2015-02-02 ENCOUNTER — Other Ambulatory Visit (HOSPITAL_COMMUNITY): Payer: Medicaid Other

## 2015-02-04 ENCOUNTER — Ambulatory Visit (HOSPITAL_COMMUNITY)
Admission: RE | Admit: 2015-02-04 | Discharge: 2015-02-04 | Disposition: A | Discharge status: Home / Self Care | Payer: BLUE CROSS/BLUE SHIELD | Source: Ambulatory Visit | Attending: General Surgery | Admitting: General Surgery

## 2015-02-04 ENCOUNTER — Encounter (HOSPITAL_COMMUNITY)
Admission: RE | Disposition: A | Discharge status: Home / Self Care | Payer: Self-pay | Source: Ambulatory Visit | Attending: General Surgery

## 2015-02-04 ENCOUNTER — Other Ambulatory Visit: Payer: Self-pay

## 2015-02-04 HISTORY — PX: BREATH TEK H PYLORI: SHX5422

## 2015-02-04 SURGERY — BREATH TEST, FOR HELICOBACTER PYLORI

## 2015-02-04 NOTE — Progress Notes (Signed)
   02/04/15 [place]  Referring MD Greer Pickerel  Time of Last PO Intake 2200  Baseline Breath At: 4461  Pranactin Given At: 0836  Post-Dose Breath At: 0851  Sample 1 2.4%  Sample 2 2.9%  Test Negative

## 2015-02-07 ENCOUNTER — Encounter (HOSPITAL_COMMUNITY): Payer: Self-pay | Admitting: General Surgery

## 2015-02-16 ENCOUNTER — Encounter: Payer: Self-pay | Admitting: Dietician

## 2015-02-16 ENCOUNTER — Encounter: Payer: BLUE CROSS/BLUE SHIELD | Attending: General Surgery | Admitting: Dietician

## 2015-02-16 VITALS — Ht 66.0 in | Wt 326.0 lb

## 2015-02-16 DIAGNOSIS — Z713 Dietary counseling and surveillance: Secondary | ICD-10-CM | POA: Insufficient documentation

## 2015-02-16 DIAGNOSIS — E669 Obesity, unspecified: Secondary | ICD-10-CM | POA: Diagnosis not present

## 2015-02-16 DIAGNOSIS — Z6841 Body Mass Index (BMI) 40.0 and over, adult: Secondary | ICD-10-CM | POA: Insufficient documentation

## 2015-02-16 NOTE — Progress Notes (Signed)
  Pre-Op Assessment Visit:  Pre-Operative Sleeve Gastrectomy Surgery  Medical Nutrition Therapy:  Appt start time: 210   End time:  245  Patient was seen on 02/16/15 for Pre-Operative Nutrition Assessment. Assessment and letter of approval faxed to [facility] Bariatric Surgery Program coordinator on 02/16/15.   Preferred Learning Style:   No preference indicated   Learning Readiness:   Ready  Handouts given during visit include:  Pre-Op Goals Bariatric Surgery Protein Shakes Bariatric Fast Food Guide   During the appointment today the following Pre-Op Goals were reviewed with the patient: Maintain or lose weight as instructed by your surgeon Make healthy food choices Begin to limit portion sizes Limited concentrated sugars and fried foods Keep fat/sugar in the single digits per serving on   food labels Practice CHEWING your food  (aim for 30 chews per bite or until applesauce consistency) Practice not drinking 15 minutes before, during, and 30 minutes after each meal/snack Avoid all carbonated beverages  Avoid/limit caffeinated beverages  Avoid all sugar-sweetened beverages Consume 3 meals per day; eat every 3-5 hours Make a list of non-food related activities Aim for 64-100 ounces of FLUID daily  Aim for at least 60-80 grams of PROTEIN daily Look for a liquid protein source that contain ?15 g protein and ?5 g carbohydrate  (ex: shakes, drinks, shots)  Patient-Centered Goals: -Healthy lifestyle -Less pain - ability to exercise -Able to fit on rides in amusement park -Fitting in booths -Feeling normal -Playing with son  Scale of 1-10: confidence (9) /importance scale (10)  Demonstrated degree of understanding via:  Teach Back  Teaching Method Utilized:  Visual Auditory Hands on  Barriers to learning/adherence to lifestyle change: none  Patient to call the [facility] to enroll in Pre-Op and Post-Op Nutrition Education  when surgery date is scheduled.

## 2015-02-16 NOTE — Patient Instructions (Signed)

## 2015-04-18 ENCOUNTER — Encounter: Payer: BLUE CROSS/BLUE SHIELD | Attending: General Surgery

## 2015-04-18 VITALS — Ht 66.0 in | Wt 327.5 lb

## 2015-04-18 DIAGNOSIS — Z6841 Body Mass Index (BMI) 40.0 and over, adult: Secondary | ICD-10-CM | POA: Diagnosis not present

## 2015-04-18 DIAGNOSIS — E669 Obesity, unspecified: Secondary | ICD-10-CM | POA: Insufficient documentation

## 2015-04-18 DIAGNOSIS — Z713 Dietary counseling and surveillance: Secondary | ICD-10-CM | POA: Insufficient documentation

## 2015-04-22 NOTE — Progress Notes (Signed)
  Pre-Operative Nutrition Class:  Appt start time: 4782   End time:  1830.  Patient was seen on 04/18/2015 for Pre-Operative Bariatric Surgery Education at the [facility].   Surgery date:  Surgery type: Sleeve Gastrectomy Start weight at [facility]: 326 lbs on 02/16/15 Weight today: 237.5 lbs  TANITA  BODY COMP RESULTS  04/18/15   BMI (kg/m^2) 52.9   Fat Mass (lbs) 179   Fat Free Mass (lbs) 148.5   Total Body Water (lbs) 108.5   Samples given per MNT protocol. Patient educated on appropriate usage: Unjury protein powder (qty 1 - chicken soup) Lot #: [id number] Exp: 04/2016  Celebrate Vitamins Multivitamin (qty 1 - orange) Lot #: [id number] Exp: 12/2016  Celebrate Vitamins Calcium Citrate chew (qty 1 -chocolate) Lot #: [id number] Exp: 12/2016  Premier protein shake (qty 1 - strawberry) Lot #: [id number] Exp: 06/2015    The following the learning objectives were met by the patient during this course:  Identify Pre-Op Dietary Goals and will begin 2 weeks pre-operatively  Identify appropriate sources of fluids and proteins   State protein recommendations and appropriate sources pre and post-operatively  Identify Post-Operative Dietary Goals and will follow for 2 weeks post-operatively  Identify appropriate multivitamin and calcium sources  Describe the need for physical activity post-operatively and will follow MD recommendations  State when to call healthcare provider regarding medication questions or post-operative complications  Handouts given during class include:  Pre-Op Bariatric Surgery Diet Handout  Protein Shake Handout  Post-Op Bariatric Surgery Nutrition Handout  BELT Program Information Flyer  Support Group Information Flyer  [facility] Bariatric Supplements Price List  Follow-Up Plan: Patient will follow-up at [facility] 2 weeks post operatively for diet advancement per MD.

## 2015-05-05 ENCOUNTER — Ambulatory Visit: Payer: Self-pay | Admitting: General Surgery

## 2015-05-05 NOTE — H&P (Signed)
Allison [name] 05/05/2015 11:26 AM Location: [facility] Patient #: [id number] DOB: Sep 03, 1986 Married / Language: English / Race: White Female History of Present Illness [name] M.  MD; 05/05/2015 1:11 PM) The patient is a 29 year old female who presents for a pre-op visit. The patient comes in today for her preoperative appointment. She has been approved for laparoscopic sleeve gastrectomy. I initially met her on April 7. Her weight at that time was 328 pounds with a BMI of 52.1. She denies any medical changes since she was initially seen. However she does state that she quit smoking. She initially stopped about 3 months ago but restarted. However she has been 3 weeks without a cigarette. She denies any chest pain, chest pressure, shortness of breath, jaw pressure arm pain. She denies any abdominal pain. She denies any diarrhea or constipation. She denies any reflux. Her EKG was within normal limits. Her upper GI showed a tiny hiatal hernia. Problem List/Past Medical [name] Ronnie Derby, MD; 05/05/2015 1:14 PM) TOBACCO USE (305.1  Z72.0) OBESITY, MORBID, BMI 50 OR HIGHER (278.01  E66.01) UPPER BACK PAIN (724.5  M54.9) ELEVATED TRIGLYCERIDES WITH HIGH CHOLESTEROL (272.2  E78.2) ELEVATED TRANSAMINASE LEVEL (790.4  R74.0)  Other Problems [name], MD; 05/05/2015 1:14 PM) Hypercholesterolemia Anxiety Disorder Asthma Depression  Past Surgical History [name], MD; 05/05/2015 1:14 PM) Tonsillectomy Cesarean Section - 1  Diagnostic Studies History [name], MD; 05/05/2015 1:14 PM) Colonoscopy never Mammogram never Pap Smear 1-5 years ago  Allergies [name], CMA; 05/05/2015 11:26 AM) No Known Drug Allergies 01/13/2015  Medication History [name], MD; 05/05/2015 1:14 PM) Azithromycin (250MG Tablet, Oral) Active. No Current Medications (Taken starting 05/05/2015) Nicotine (21MG/24HR Patch 24HR, Transdermal)  Active. PredniSONE (5MG Tablet, Oral) Active. Medications Reconciled OxyCODONE HCl (5MG/5ML Solution, 5-10 Milliliter Oral every four hours, as needed, Taken starting 05/05/2015) Active.  Social History [name], MD; 05/05/2015 1:14 PM) Tobacco use Current every day smoker. No drug use Alcohol use Occasional alcohol use. Caffeine use Carbonated beverages, Coffee.  Family History [name], MD; 05/05/2015 1:14 PM) Alcohol Abuse Brother. Cancer Family Members In General. Thyroid problems Brother, Mother. Colon Cancer Family Members In General. Hypertension Father. Migraine Headache Mother, Sister.  Pregnancy / Birth History [name], MD; 05/05/2015 1:14 PM) Age at menarche 84 years. Gravida 1 Maternal age 22-30 Para 1 Regular periods     Review of Systems [name], MD; 05/05/2015 1:09 00) General Present- Weight Gain. Not Present- Appetite Loss, Chills, Fatigue, Fever, Night Sweats and Weight Loss. Skin Present- Dryness. Not Present- Change in Wart/Mole, Hives, Jaundice, New Lesions, Non-Healing Wounds, Rash and Ulcer. HEENT Present- Seasonal Allergies. Not Present- Earache, Hearing Loss, Hoarseness, Nose Bleed, Oral Ulcers, Ringing in the Ears, Sinus Pain, Sore Throat, Visual Disturbances, Wears glasses/contact lenses and Yellow Eyes. Respiratory Not Present- Bloody sputum, Chronic Cough, Difficulty Breathing, Snoring and Wheezing. Breast Not Present- Breast Mass, Breast Pain, Nipple Discharge and Skin Changes. Cardiovascular Not Present- Chest Pain, Difficulty Breathing Lying Down, Leg Cramps, Palpitations, Rapid Heart Rate, Shortness of Breath and Swelling of Extremities. Gastrointestinal Not Present- Abdominal Pain, Bloating, Bloody Stool, Change in Bowel Habits, Chronic diarrhea, Constipation, Difficulty Swallowing, Excessive gas, Gets full quickly at meals, Hemorrhoids, Indigestion, Nausea, Rectal Pain and Vomiting. Female Genitourinary  Not Present- Frequency, Nocturia, Painful Urination, Pelvic Pain and Urgency. Musculoskeletal Present- Back Pain and Joint Pain. Not Present- Joint Stiffness, Muscle Pain, Muscle Weakness and Swelling of Extremities. Neurological Not  Present- Decreased Memory, Fainting, Headaches, Numbness, Seizures, Tingling, Tremor, Trouble walking and Weakness. Psychiatric Not Present- Anxiety, Bipolar, Change in Sleep Pattern, Depression, Fearful and Frequent crying. Endocrine Not Present- Cold Intolerance, Excessive Hunger, Hair Changes, Heat Intolerance, Hot flashes and New Diabetes. Hematology Not Present- Easy Bruising, Excessive bleeding, Gland problems, HIV and Persistent Infections.  Vitals [name] CMA; 05/05/2015 11:27 AM) 05/05/2015 11:26 AM Weight: 328.2 lb Height: 66in Body Surface Area: 2.63 m Body Mass Index: 52.97 kg/m Temp.: 98.19F(Oral)  Pulse: 72 (Regular)  BP: 140/80 (Sitting, Left Arm, Standard)     Physical Exam [name] M. Giana Castner MD; 05/05/2015 1:09 PM)  General Mental Status-Alert. General Appearance-Consistent with stated age. Hydration-Well hydrated. Voice-Normal. Note: morbidly obese; central truncal   Head and Neck Head-normocephalic, atraumatic with no lesions or palpable masses. Trachea-midline. Thyroid Gland Characteristics - normal size and consistency.  Eye Eyeball - Bilateral-Extraocular movements intact. Sclera/Conjunctiva - Bilateral-No scleral icterus.  Chest and Lung Exam Chest and lung exam reveals -quiet, even and easy respiratory effort with no use of accessory muscles and on auscultation, normal breath sounds, no adventitious sounds and normal vocal resonance. Inspection Chest Wall - Normal. Back - normal.  Breast - Did not examine.  Cardiovascular Cardiovascular examination reveals -normal heart sounds, regular rate and rhythm with no murmurs and normal pedal pulses  bilaterally.  Abdomen Inspection Inspection of the abdomen reveals - No Hernias. Skin - Scar - Note: well healed low transverse c/s scar. Palpation/Percussion Palpation and Percussion of the abdomen reveal - Soft, Non Tender, No Rebound tenderness, No Rigidity (guarding) and No hepatosplenomegaly. Auscultation Auscultation of the abdomen reveals - Bowel sounds normal.  Peripheral Vascular Upper Extremity Palpation - Pulses bilaterally normal.  Neurologic Neurologic evaluation reveals -alert and oriented x 3 with no impairment of recent or remote memory. Mental Status-Normal.  Neuropsychiatric The patient's mood and affect are described as -normal. Judgment and Insight-insight is appropriate concerning matters relevant to self.  Musculoskeletal Normal Exam - Left-Upper Extremity Strength Normal and Lower Extremity Strength Normal. Normal Exam - Right-Upper Extremity Strength Normal and Lower Extremity Strength Normal.  Lymphatic Head & Neck  General Head & Neck Lymphatics: Bilateral - Description - Normal. Axillary - Did not examine. Femoral & Inguinal - Did not examine.   Assessment & Plan [name] M. Oluwatimileyin Vivier MD; 05/05/2015 1:13 PM)  OBESITY, MORBID, BMI 50 OR HIGHER (278.01  E66.01) Impression: We reviewed her preoperative workup. I congratulated her on stopping smoking. I stressed her the importance of continuing to refrain from smoking. She has maintained her weight since her initial visit. She is starting her preoperative diet next week. We discussed the findings of a small hiatal hernia. I recommended testing interoperatively for clinically significant one. If found to have a clinically significant hiatal hernia I recommended going ahead and repairing it at the time of surgery. We discussed what that would involve. She was given her postoperative pain medicine prescription today.  Current Plans Pt Education - EMW_preopbariatric Started OxyCODONE HCl 5MG/5ML, 5-10  Milliliter every four hours, as needed, 200 Milliliter, 05/05/2015, No Refill. UPPER BACK PAIN (724.5  M54.9)  ELEVATED TRIGLYCERIDES WITH HIGH CHOLESTEROL (272.2  E78.2) Impression: We discussed her elevated lipid panel. This should improve with weight loss.  ELEVATED TRANSAMINASE LEVEL (790.4  R74.0) Impression: We discussed her mildly elevated AST and ALT. Probably due to underlying fatty liver. We will monitor her LFTs over time  [name]. [name], MD, FACS General, Bariatric, & Minimally Invasive Surgery [facility], [place]

## 2015-05-17 NOTE — Patient Instructions (Addendum)
YOUR PROCEDURE IS SCHEDULED ON :  05/23/15  REPORT TO [facility] MAIN ENTRANCE FOLLOW SIGNS TO EAST ELEVATOR - GO TO 3rd FLOOR CHECK IN AT 3 EAST NURSES STATION (SHORT STAY) AT:  10:15 am  CALL THIS NUMBER IF YOU HAVE PROBLEMS THE MORNING OF SURGERY [phone]  REMEMBER:ONLY 1 PER PERSON MAY GO TO SHORT STAY WITH YOU TO GET READY THE MORNING OF YOUR SURGERY  DO NOT EAT FOOD OR DRINK LIQUIDS AFTER MIDNIGHT  TAKE THESE MEDICINES THE MORNING OF SURGERY: NONE   BRING INHALER TO HOSPITAL  STOP ASPIRIN / IBUPROFEN / ALEVE / VITAMINS / HERBAL MEDS __7__ DAYS BEFORE SURGERY  YOU MAY NOT HAVE ANY METAL ON YOUR BODY INCLUDING HAIR PINS AND PIERCING'S. DO NOT WEAR JEWELRY, MAKEUP, LOTIONS, POWDERS OR PERFUMES. DO NOT WEAR NAIL POLISH. DO NOT SHAVE 48 HRS PRIOR TO SURGERY. MEN MAY SHAVE FACE AND NECK.  DO NOT [place]. [facility] IS NOT RESPONSIBLE FOR VALUABLES.  CONTACTS, DENTURES OR PARTIALS MAY NOT BE WORN TO SURGERY. LEAVE SUITCASE IN CAR. CAN BE BROUGHT TO ROOM AFTER SURGERY.  PATIENTS DISCHARGED THE DAY OF SURGERY WILL NOT BE ALLOWED TO DRIVE HOME.  PLEASE READ OVER THE FOLLOWING INSTRUCTION SHEETS _________________________________________________________________________________                                          [facility] - PREPARING FOR SURGERY  Before surgery, you can play an important role.  Because skin is not sterile, your skin needs to be as free of germs as possible.  You can reduce the number of germs on your skin by washing with CHG (chlorahexidine gluconate) soap before surgery.  CHG is an antiseptic cleaner which kills germs and bonds with the skin to continue killing germs even after washing. Please DO NOT use if you have an allergy to CHG or antibacterial soaps.  If your skin becomes reddened/irritated stop using the CHG and inform your nurse when you arrive at Short Stay. Do not shave (including legs and underarms) for at  least 48 hours prior to the first CHG shower.  You may shave your face. Please follow these instructions carefully:   1.  Shower with CHG Soap the night before surgery and the  morning of Surgery.   2.  If you choose to wash your hair, wash your hair first as usual with your  normal  Shampoo.   3.  After you shampoo, rinse your hair and body thoroughly to remove the  shampoo.                                         4.  Use CHG as you would any other liquid soap.  You can apply chg directly  to the skin and wash . Gently wash with scrungie or clean wascloth    5.  Apply the CHG Soap to your body ONLY FROM THE NECK DOWN.   Do not use on open                           Wound or open sores. Avoid contact with eyes, ears mouth and genitals (private parts).  Genitals (private parts) with your normal soap.              6.  Wash thoroughly, paying special attention to the area where your surgery  will be performed.   7.  Thoroughly rinse your body with warm water from the neck down.   8.  DO NOT shower/wash with your normal soap after using and rinsing off  the CHG Soap .                9.  Pat yourself dry with a clean towel.             10.  Wear clean night clothes to bed after shower             11.  Place clean sheets on your bed the night of your first shower and do not  sleep with pets.  Day of Surgery : Do not apply any lotions/deodorants the morning of surgery.  Please wear clean clothes to the hospital/surgery center.  FAILURE TO FOLLOW THESE INSTRUCTIONS MAY RESULT IN THE CANCELLATION OF YOUR SURGERY    PATIENT SIGNATURE_________________________________  ______________________________________________________________________

## 2015-05-18 ENCOUNTER — Encounter (HOSPITAL_COMMUNITY)
Admission: RE | Admit: 2015-05-18 | Discharge: 2015-05-18 | Disposition: A | Discharge status: Home / Self Care | Payer: BLUE CROSS/BLUE SHIELD | Source: Ambulatory Visit | Attending: General Surgery | Admitting: General Surgery

## 2015-05-18 ENCOUNTER — Encounter (HOSPITAL_COMMUNITY): Payer: Self-pay

## 2015-05-18 DIAGNOSIS — Z01818 Encounter for other preprocedural examination: Secondary | ICD-10-CM | POA: Diagnosis present

## 2015-05-18 LAB — COMPREHENSIVE METABOLIC PANEL
ALT: 66 U/L — AB (ref 14–54)
ANION GAP: 12 (ref 5–15)
AST: 56 U/L — AB (ref 15–41)
Albumin: 4.4 g/dL (ref 3.5–5.0)
Alkaline Phosphatase: 55 U/L (ref 38–126)
BILIRUBIN TOTAL: 0.6 mg/dL (ref 0.3–1.2)
BUN: 17 mg/dL (ref 6–20)
CO2: 22 mmol/L (ref 22–32)
Calcium: 9.4 mg/dL (ref 8.9–10.3)
Chloride: 104 mmol/L (ref 101–111)
Creatinine, Ser: 0.74 mg/dL (ref 0.44–1.00)
GFR calc Af Amer: >60 mL/min (ref >60)
GFR calc non Af Amer: >60 mL/min (ref >60)
Glucose, Bld: 91 mg/dL (ref 65–99)
Potassium: 4.3 mmol/L (ref 3.5–5.1)
Sodium: 138 mmol/L (ref 135–145)
TOTAL PROTEIN: 7.6 g/dL (ref 6.5–8.1)

## 2015-05-18 LAB — CBC WITH DIFFERENTIAL/PLATELET
Basophils Absolute: 0 10*3/uL (ref 0.0–0.1)
Basophils Relative: 0 % (ref 0–1)
EOS PCT: 3 % (ref 0–5)
Eosinophils Absolute: 0.2 10*3/uL (ref 0.0–0.7)
HEMATOCRIT: 44.5 % (ref 36.0–46.0)
HEMOGLOBIN: 14.8 g/dL (ref 12.0–15.0)
Lymphocytes Relative: 28 % (ref 12–46)
Lymphs Abs: 2.2 10*3/uL (ref 0.7–4.0)
MCH: 29.7 pg (ref 26.0–34.0)
MCHC: 33.3 g/dL (ref 30.0–36.0)
MCV: 89.4 fL (ref 78.0–100.0)
MONO ABS: 0.4 10*3/uL (ref 0.1–1.0)
Monocytes Relative: 6 % (ref 3–12)
NEUTROS ABS: 5 10*3/uL (ref 1.7–7.7)
Neutrophils Relative %: 63 % (ref 43–77)
Platelets: 197 10*3/uL (ref 150–400)
RBC: 4.98 MIL/uL (ref 3.87–5.11)
RDW: 13.8 % (ref 11.5–15.5)
WBC: 7.9 10*3/uL (ref 4.0–10.5)

## 2015-05-18 LAB — HCG, SERUM, QUALITATIVE: PREG SERUM: NEGATIVE

## 2015-05-23 ENCOUNTER — Inpatient Hospital Stay (HOSPITAL_COMMUNITY)
Admission: RE | Admit: 2015-05-23 | Discharge: 2015-05-26 | DRG: 621 | Disposition: A | Discharge status: Home / Self Care | Payer: BLUE CROSS/BLUE SHIELD | Source: Ambulatory Visit | Attending: General Surgery | Admitting: General Surgery

## 2015-05-23 ENCOUNTER — Inpatient Hospital Stay (HOSPITAL_COMMUNITY): Payer: BLUE CROSS/BLUE SHIELD | Admitting: Anesthesiology

## 2015-05-23 ENCOUNTER — Encounter (HOSPITAL_COMMUNITY): Payer: Self-pay | Admitting: *Deleted

## 2015-05-23 ENCOUNTER — Encounter (HOSPITAL_COMMUNITY)
Admission: RE | Disposition: A | Discharge status: Home / Self Care | Payer: Self-pay | Source: Ambulatory Visit | Attending: General Surgery

## 2015-05-23 DIAGNOSIS — Z8249 Family history of ischemic heart disease and other diseases of the circulatory system: Secondary | ICD-10-CM | POA: Diagnosis not present

## 2015-05-23 DIAGNOSIS — K449 Diaphragmatic hernia without obstruction or gangrene: Secondary | ICD-10-CM | POA: Diagnosis present

## 2015-05-23 DIAGNOSIS — Z6841 Body Mass Index (BMI) 40.0 and over, adult: Secondary | ICD-10-CM

## 2015-05-23 DIAGNOSIS — E782 Mixed hyperlipidemia: Secondary | ICD-10-CM | POA: Diagnosis present

## 2015-05-23 DIAGNOSIS — E780 Pure hypercholesterolemia: Secondary | ICD-10-CM | POA: Diagnosis present

## 2015-05-23 DIAGNOSIS — Z01812 Encounter for preprocedural laboratory examination: Secondary | ICD-10-CM

## 2015-05-23 DIAGNOSIS — R110 Nausea: Secondary | ICD-10-CM

## 2015-05-23 DIAGNOSIS — Z9884 Bariatric surgery status: Secondary | ICD-10-CM

## 2015-05-23 DIAGNOSIS — K429 Umbilical hernia without obstruction or gangrene: Secondary | ICD-10-CM | POA: Diagnosis present

## 2015-05-23 DIAGNOSIS — Z809 Family history of malignant neoplasm, unspecified: Secondary | ICD-10-CM | POA: Diagnosis not present

## 2015-05-23 DIAGNOSIS — R7401 Elevation of levels of liver transaminase levels: Secondary | ICD-10-CM | POA: Diagnosis present

## 2015-05-23 DIAGNOSIS — E781 Pure hyperglyceridemia: Secondary | ICD-10-CM | POA: Diagnosis present

## 2015-05-23 DIAGNOSIS — R740 Nonspecific elevation of levels of transaminase and lactic acid dehydrogenase [LDH]: Secondary | ICD-10-CM

## 2015-05-23 DIAGNOSIS — I10 Essential (primary) hypertension: Secondary | ICD-10-CM | POA: Diagnosis present

## 2015-05-23 HISTORY — PX: UPPER GI ENDOSCOPY: SHX6162

## 2015-05-23 HISTORY — PX: LAPAROSCOPIC GASTRIC SLEEVE RESECTION WITH HIATAL HERNIA REPAIR: SHX6512

## 2015-05-23 LAB — HEMOGLOBIN AND HEMATOCRIT, BLOOD
HEMATOCRIT: 43.9 % (ref 36.0–46.0)
HEMOGLOBIN: 14.4 g/dL (ref 12.0–15.0)

## 2015-05-23 SURGERY — GASTRECTOMY, SLEEVE, LAPAROSCOPIC, WITH HIATAL HERNIA REPAIR
Anesthesia: General

## 2015-05-23 MED ORDER — ACETAMINOPHEN 160 MG/5ML PO SOLN
650.0000 mg | ORAL | Status: DC | PRN
  Filled 2015-05-23: qty 20.3

## 2015-05-23 MED ORDER — FENTANYL CITRATE (PF) 100 MCG/2ML IJ SOLN
INTRAMUSCULAR | Status: DC | PRN
  Administered 2015-05-23: 50 ug via INTRAVENOUS
  Administered 2015-05-23 (×2): 100 ug via INTRAVENOUS

## 2015-05-23 MED ORDER — HYDROMORPHONE HCL 1 MG/ML IJ SOLN
INTRAMUSCULAR | Status: AC
  Filled 2015-05-23: qty 1

## 2015-05-23 MED ORDER — ROCURONIUM BROMIDE 100 MG/10ML IV SOLN
INTRAVENOUS | Status: DC | PRN
  Administered 2015-05-23 (×3): 10 mg via INTRAVENOUS
  Administered 2015-05-23: 50 mg via INTRAVENOUS

## 2015-05-23 MED ORDER — MORPHINE SULFATE (PF) 2 MG/ML IV SOLN
2.0000 mg | INTRAVENOUS | Status: DC | PRN
  Administered 2015-05-23 – 2015-05-24 (×6): 2 mg via INTRAVENOUS
  Filled 2015-05-23 (×5): qty 1
  Filled 2015-05-23: qty 2
  Filled 2015-05-23: qty 1

## 2015-05-23 MED ORDER — HYDROMORPHONE HCL 2 MG/ML IJ SOLN
INTRAMUSCULAR | Status: AC
  Filled 2015-05-23: qty 1

## 2015-05-23 MED ORDER — CHLORHEXIDINE GLUCONATE 4 % EX LIQD
60.0000 mL | Freq: Once | CUTANEOUS | Status: DC
Start: 2015-05-24 — End: 2015-05-23

## 2015-05-23 MED ORDER — NEOSTIGMINE METHYLSULFATE 10 MG/10ML IV SOLN
INTRAVENOUS | Status: DC | PRN
  Administered 2015-05-23: 5 mg via INTRAVENOUS

## 2015-05-23 MED ORDER — MIDAZOLAM HCL 2 MG/2ML IJ SOLN
INTRAMUSCULAR | Status: AC
  Filled 2015-05-23: qty 4

## 2015-05-23 MED ORDER — ONDANSETRON HCL 4 MG/2ML IJ SOLN
INTRAMUSCULAR | Status: DC | PRN
  Administered 2015-05-23: 4 mg via INTRAVENOUS

## 2015-05-23 MED ORDER — HEPARIN SODIUM (PORCINE) 5000 UNIT/ML IJ SOLN
5000.0000 [IU] | INTRAMUSCULAR | Status: AC
  Administered 2015-05-23: 5000 [IU] via SUBCUTANEOUS
  Filled 2015-05-23: qty 1

## 2015-05-23 MED ORDER — MIDAZOLAM HCL 5 MG/5ML IJ SOLN
INTRAMUSCULAR | Status: DC | PRN
  Administered 2015-05-23: 2 mg via INTRAVENOUS

## 2015-05-23 MED ORDER — BUPIVACAINE LIPOSOME 1.3 % IJ SUSP
20.0000 mL | Freq: Once | INTRAMUSCULAR | Status: DC
  Filled 2015-05-23: qty 20

## 2015-05-23 MED ORDER — LACTATED RINGERS IR SOLN
Status: DC | PRN
  Administered 2015-05-23: 1000 mL

## 2015-05-23 MED ORDER — NICOTINE 7 MG/24HR TD PT24
7.0000 mg | MEDICATED_PATCH | Freq: Every day | TRANSDERMAL | Status: DC
  Administered 2015-05-25 – 2015-05-26 (×2): 7 mg via TRANSDERMAL
  Filled 2015-05-23 (×4): qty 1

## 2015-05-23 MED ORDER — CHLORHEXIDINE GLUCONATE 4 % EX LIQD: 60.0000 mL | Freq: Once | CUTANEOUS | Status: DC

## 2015-05-23 MED ORDER — LACTATED RINGERS IV SOLN
INTRAVENOUS | Status: DC
  Administered 2015-05-23: 1000 mL via INTRAVENOUS
  Administered 2015-05-23 (×2): via INTRAVENOUS

## 2015-05-23 MED ORDER — METOCLOPRAMIDE HCL 5 MG/ML IJ SOLN
10.0000 mg | Freq: Three times a day (TID) | INTRAMUSCULAR | Status: DC | PRN
  Administered 2015-05-24: 10 mg via INTRAVENOUS
  Filled 2015-05-23: qty 2

## 2015-05-23 MED ORDER — SODIUM CHLORIDE 0.9 % IJ SOLN
INTRAMUSCULAR | Status: DC | PRN
  Administered 2015-05-23: 50 mL via INTRAVENOUS

## 2015-05-23 MED ORDER — 0.9 % SODIUM CHLORIDE (POUR BTL) OPTIME
TOPICAL | Status: DC | PRN
  Administered 2015-05-23: 1000 mL

## 2015-05-23 MED ORDER — MIDAZOLAM HCL 2 MG/2ML IJ SOLN: 0.5000 mg | Freq: Once | INTRAMUSCULAR | Status: DC | PRN

## 2015-05-23 MED ORDER — UNJURY CHICKEN SOUP POWDER
2.0000 [oz_av] | Freq: Four times a day (QID) | ORAL | Status: DC
  Administered 2015-05-25: 2 [oz_av] via ORAL

## 2015-05-23 MED ORDER — ENOXAPARIN SODIUM 30 MG/0.3ML ~~LOC~~ SOLN
30.0000 mg | Freq: Two times a day (BID) | SUBCUTANEOUS | Status: DC
  Administered 2015-05-24 – 2015-05-26 (×5): 30 mg via SUBCUTANEOUS
  Filled 2015-05-23 (×8): qty 0.3

## 2015-05-23 MED ORDER — DEXAMETHASONE SODIUM PHOSPHATE 10 MG/ML IJ SOLN
INTRAMUSCULAR | Status: AC
  Filled 2015-05-23: qty 1

## 2015-05-23 MED ORDER — ROCURONIUM BROMIDE 100 MG/10ML IV SOLN
INTRAVENOUS | Status: AC
  Filled 2015-05-23: qty 1

## 2015-05-23 MED ORDER — SODIUM CHLORIDE 0.9 % IJ SOLN
INTRAMUSCULAR | Status: AC
Start: 2015-05-23 — End: 2015-05-23
  Filled 2015-05-23: qty 50

## 2015-05-23 MED ORDER — PROPOFOL 10 MG/ML IV BOLUS
INTRAVENOUS | Status: DC | PRN
  Administered 2015-05-23: 350 mg via INTRAVENOUS

## 2015-05-23 MED ORDER — BUPIVACAINE LIPOSOME 1.3 % IJ SUSP
INTRAMUSCULAR | Status: DC | PRN
  Administered 2015-05-23: 20 mL

## 2015-05-23 MED ORDER — ONDANSETRON HCL 4 MG/2ML IJ SOLN
INTRAMUSCULAR | Status: AC
  Filled 2015-05-23: qty 2

## 2015-05-23 MED ORDER — PROMETHAZINE HCL 25 MG/ML IJ SOLN
12.5000 mg | Freq: Four times a day (QID) | INTRAMUSCULAR | Status: DC | PRN
  Administered 2015-05-23: 12.5 mg via INTRAVENOUS
  Filled 2015-05-23: qty 1

## 2015-05-23 MED ORDER — DIPHENHYDRAMINE HCL 50 MG/ML IJ SOLN
INTRAMUSCULAR | Status: AC
  Filled 2015-05-23: qty 1

## 2015-05-23 MED ORDER — OXYCODONE HCL 5 MG/5ML PO SOLN
5.0000 mg | ORAL | Status: DC | PRN
  Administered 2015-05-24: 10 mg via ORAL
  Administered 2015-05-24 – 2015-05-25 (×2): 5 mg via ORAL
  Filled 2015-05-23: qty 10
  Filled 2015-05-23: qty 25
  Filled 2015-05-23: qty 5

## 2015-05-23 MED ORDER — CEFOTETAN DISODIUM-DEXTROSE 2-2.08 GM-% IV SOLR
INTRAVENOUS | Status: AC
  Filled 2015-05-23: qty 50

## 2015-05-23 MED ORDER — PANTOPRAZOLE SODIUM 40 MG IV SOLR
40.0000 mg | Freq: Every day | INTRAVENOUS | Status: DC
  Administered 2015-05-23 – 2015-05-25 (×3): 40 mg via INTRAVENOUS
  Filled 2015-05-23 (×4): qty 40

## 2015-05-23 MED ORDER — PROPOFOL 10 MG/ML IV BOLUS
INTRAVENOUS | Status: AC
  Filled 2015-05-23: qty 20

## 2015-05-23 MED ORDER — SODIUM CHLORIDE 0.9 % IJ SOLN
INTRAMUSCULAR | Status: AC
  Filled 2015-05-23: qty 10

## 2015-05-23 MED ORDER — MEPERIDINE HCL 50 MG/ML IJ SOLN: 6.2500 mg | INTRAMUSCULAR | Status: DC | PRN

## 2015-05-23 MED ORDER — UNJURY CHOCOLATE CLASSIC POWDER
2.0000 [oz_av] | Freq: Four times a day (QID) | ORAL | Status: DC
  Administered 2015-05-25: 2 [oz_av] via ORAL

## 2015-05-23 MED ORDER — KCL IN DEXTROSE-NACL 20-5-0.45 MEQ/L-%-% IV SOLN
INTRAVENOUS | Status: DC
  Administered 2015-05-23 – 2015-05-24 (×3): 125 mL/h via INTRAVENOUS
  Administered 2015-05-25: 16:00:00 via INTRAVENOUS
  Administered 2015-05-26: 1000 mL via INTRAVENOUS
  Filled 2015-05-23 (×10): qty 1000

## 2015-05-23 MED ORDER — LIDOCAINE HCL (CARDIAC) 20 MG/ML IV SOLN
INTRAVENOUS | Status: AC
  Filled 2015-05-23: qty 5

## 2015-05-23 MED ORDER — GLYCOPYRROLATE 0.2 MG/ML IJ SOLN
INTRAMUSCULAR | Status: DC | PRN
  Administered 2015-05-23: .7 mg via INTRAVENOUS

## 2015-05-23 MED ORDER — DEXAMETHASONE SODIUM PHOSPHATE 10 MG/ML IJ SOLN
INTRAMUSCULAR | Status: DC | PRN
  Administered 2015-05-23: 10 mg via INTRAVENOUS

## 2015-05-23 MED ORDER — SUCCINYLCHOLINE CHLORIDE 20 MG/ML IJ SOLN
INTRAMUSCULAR | Status: DC | PRN
  Administered 2015-05-23: 200 mg via INTRAVENOUS

## 2015-05-23 MED ORDER — UNJURY VANILLA POWDER
2.0000 [oz_av] | Freq: Four times a day (QID) | ORAL | Status: DC
  Administered 2015-05-25 – 2015-05-26 (×2): 2 [oz_av] via ORAL

## 2015-05-23 MED ORDER — METHOCARBAMOL 1000 MG/10ML IJ SOLN
500.0000 mg | Freq: Three times a day (TID) | INTRAVENOUS | Status: DC
  Administered 2015-05-23 – 2015-05-26 (×7): 500 mg via INTRAVENOUS
  Filled 2015-05-23 (×14): qty 5

## 2015-05-23 MED ORDER — DIPHENHYDRAMINE HCL 50 MG/ML IJ SOLN
6.2500 mg | Freq: Once | INTRAMUSCULAR | Status: AC
  Administered 2015-05-23: 6.25 mg via INTRAVENOUS

## 2015-05-23 MED ORDER — ACETAMINOPHEN 160 MG/5ML PO SOLN: 325.0000 mg | ORAL | Status: DC | PRN

## 2015-05-23 MED ORDER — PROMETHAZINE HCL 25 MG/ML IJ SOLN
INTRAMUSCULAR | Status: AC
  Filled 2015-05-23: qty 1

## 2015-05-23 MED ORDER — ONDANSETRON HCL 4 MG/2ML IJ SOLN
4.0000 mg | INTRAMUSCULAR | Status: DC | PRN
  Administered 2015-05-23 – 2015-05-24 (×3): 4 mg via INTRAVENOUS
  Filled 2015-05-23 (×3): qty 2

## 2015-05-23 MED ORDER — FENTANYL CITRATE (PF) 250 MCG/5ML IJ SOLN
INTRAMUSCULAR | Status: AC
  Filled 2015-05-23: qty 25

## 2015-05-23 MED ORDER — PROMETHAZINE HCL 25 MG/ML IJ SOLN
6.2500 mg | INTRAMUSCULAR | Status: AC | PRN
  Administered 2015-05-23 (×2): 6.25 mg via INTRAVENOUS

## 2015-05-23 MED ORDER — CEFOTETAN DISODIUM-DEXTROSE 2-2.08 GM-% IV SOLR
2.0000 g | INTRAVENOUS | Status: AC
  Administered 2015-05-23: 2 g via INTRAVENOUS

## 2015-05-23 MED ORDER — HYDROMORPHONE HCL 1 MG/ML IJ SOLN
0.2500 mg | INTRAMUSCULAR | Status: DC | PRN
  Administered 2015-05-23 (×3): 0.5 mg via INTRAVENOUS

## 2015-05-23 MED ORDER — HYDROMORPHONE HCL 1 MG/ML IJ SOLN
INTRAMUSCULAR | Status: DC | PRN
  Administered 2015-05-23: 1 mg via INTRAVENOUS
  Administered 2015-05-23: 0.5 mg via INTRAVENOUS

## 2015-05-23 SURGICAL SUPPLY — 69 items
ADH SKN CLS APL DERMABOND .7 (GAUZE/BANDAGES/DRESSINGS) ×2
APL SRG 32X5 SNPLK LF DISP (MISCELLANEOUS)
APPLICATOR COTTON TIP 6IN STRL (MISCELLANEOUS) IMPLANT
APPLIER CLIP ROT 10 11.4 M/L (STAPLE)
APR CLP MED LRG 11.4X10 (STAPLE)
BLADE SURG SZ11 CARB STEEL (BLADE) ×4 IMPLANT
CABLE HIGH FREQUENCY MONO STRZ (ELECTRODE) ×2 IMPLANT
CHLORAPREP W/TINT 26ML (MISCELLANEOUS) ×8 IMPLANT
CLIP APPLIE ROT 10 11.4 M/L (STAPLE) IMPLANT
COVER SURGICAL LIGHT HANDLE (MISCELLANEOUS) ×4 IMPLANT
DERMABOND ADVANCED (GAUZE/BANDAGES/DRESSINGS) ×2
DERMABOND ADVANCED .7 DNX12 (GAUZE/BANDAGES/DRESSINGS) ×2 IMPLANT
DEVICE SUT QUICK LOAD TK 5 (STAPLE) ×1 IMPLANT
DEVICE SUT TI-KNOT TK 5X26 (MISCELLANEOUS) ×1 IMPLANT
DEVICE SUTURE ENDOST 10MM (ENDOMECHANICALS) ×2 IMPLANT
DEVICE TI KNOT TK5 (MISCELLANEOUS) ×1
DEVICE TROCAR PUNCTURE CLOSURE (ENDOMECHANICALS) ×4 IMPLANT
DRAPE CAMERA CLOSED 9X96 (DRAPES) ×4 IMPLANT
DRAPE UTILITY XL STRL (DRAPES) ×8 IMPLANT
ELECT REM PT RETURN 9FT ADLT (ELECTROSURGICAL) ×4
ELECTRODE REM PT RTRN 9FT ADLT (ELECTROSURGICAL) ×2 IMPLANT
GAUZE SPONGE 4X4 12PLY STRL (GAUZE/BANDAGES/DRESSINGS) IMPLANT
GLOVE BIOGEL M STRL SZ7.5 (GLOVE) ×4 IMPLANT
GOWN STRL REUS W/TWL XL LVL3 (GOWN DISPOSABLE) ×14 IMPLANT
HOVERMATT SINGLE USE (MISCELLANEOUS) ×4 IMPLANT
KIT BASIN OR (CUSTOM PROCEDURE TRAY) ×4 IMPLANT
NDL SPNL 22GX3.5 QUINCKE BK (NEEDLE) ×2 IMPLANT
NEEDLE SPNL 22GX3.5 QUINCKE BK (NEEDLE) ×4 IMPLANT
PACK UNIVERSAL I (CUSTOM PROCEDURE TRAY) ×4 IMPLANT
PEN SKIN MARKING BROAD (MISCELLANEOUS) ×4 IMPLANT
QUICK LOAD TK 5 (STAPLE) ×1
RELOAD STAPLE 60 3.6 BLU REG (STAPLE) IMPLANT
RELOAD STAPLE 60 3.8 GOLD REG (STAPLE) IMPLANT
RELOAD STAPLE 60 4.1 GRN THCK (STAPLE) IMPLANT
RELOAD STAPLER BLUE 60MM (STAPLE) ×6 IMPLANT
RELOAD STAPLER GOLD 60MM (STAPLE) ×4 IMPLANT
RELOAD STAPLER GREEN 60MM (STAPLE) ×4 IMPLANT
SCISSORS LAP 5X45 EPIX DISP (ENDOMECHANICALS) ×4 IMPLANT
SEALANT SURGICAL APPL DUAL CAN (MISCELLANEOUS) IMPLANT
SET IRRIG TUBING LAPAROSCOPIC (IRRIGATION / IRRIGATOR) ×4 IMPLANT
SHEARS CURVED HARMONIC AC 45CM (MISCELLANEOUS) ×4 IMPLANT
SLEEVE ADV FIXATION 5X100MM (TROCAR) ×8 IMPLANT
SLEEVE GASTRECTOMY 36FR VISIGI (MISCELLANEOUS) ×4 IMPLANT
SLEEVE XCEL OPT CAN 5 100 (ENDOMECHANICALS) IMPLANT
SOLUTION ANTI FOG 6CC (MISCELLANEOUS) ×4 IMPLANT
SPONGE LAP 18X18 X RAY DECT (DISPOSABLE) ×4 IMPLANT
STAPLER ECHELON BIOABSB 60 FLE (MISCELLANEOUS) ×18 IMPLANT
STAPLER ECHELON LONG 60 440 (INSTRUMENTS) ×2 IMPLANT
STAPLER RELOAD BLUE 60MM (STAPLE) ×12
STAPLER RELOAD GOLD 60MM (STAPLE) ×8
STAPLER RELOAD GREEN 60MM (STAPLE) ×8
SUT MNCRL AB 4-0 PS2 18 (SUTURE) ×4 IMPLANT
SUT SURGIDAC NAB ES-9 0 48 120 (SUTURE) ×2 IMPLANT
SUT VICRYL 0 TIES 12 18 (SUTURE) ×4 IMPLANT
SYR 10ML ECCENTRIC (SYRINGE) ×4 IMPLANT
SYR 20CC LL (SYRINGE) ×4 IMPLANT
SYR 50ML LL SCALE MARK (SYRINGE) ×4 IMPLANT
TOWEL OR 17X26 10 PK STRL BLUE (TOWEL DISPOSABLE) ×4 IMPLANT
TOWEL OR NON WOVEN STRL DISP B (DISPOSABLE) ×4 IMPLANT
TRAY FOLEY W/METER SILVER 14FR (SET/KITS/TRAYS/PACK) IMPLANT
TRAY FOLEY W/METER SILVER 16FR (SET/KITS/TRAYS/PACK) IMPLANT
TROCAR ADV FIXATION 5X100MM (TROCAR) ×4 IMPLANT
TROCAR BLADELESS 15MM (ENDOMECHANICALS) ×4 IMPLANT
TROCAR BLADELESS OPT 5 100 (ENDOMECHANICALS) ×4 IMPLANT
TUBE CALIBRATION LAPBAND (TUBING) ×2 IMPLANT
TUBING CONNECTING 10 (TUBING) ×4 IMPLANT
TUBING CONNECTING 10' (TUBING) ×2
TUBING ENDO SMARTCAP (MISCELLANEOUS) ×4 IMPLANT
TUBING FILTER THERMOFLATOR (ELECTROSURGICAL) ×4 IMPLANT

## 2015-05-23 NOTE — H&P (View-Only) (Signed)
Allison [name] 05/05/2015 11:26 AM Location: [facility] Patient #: [id number] DOB: 12/23/1985 Married / Language: English / Race: White Female History of Present Illness (Allison Augusta M. Emelin Dascenzo MD; 05/05/2015 1:11 PM) The patient is a 29 year old female who presents for a pre-op visit. The patient comes in today for her preoperative appointment. She has been approved for laparoscopic sleeve gastrectomy. I initially met her on April 7. Her weight at that time was 328 pounds with a BMI of 52.1. She denies any medical changes since she was initially seen. However she does state that she quit smoking. She initially stopped about 3 months ago but restarted. However she has been 3 weeks without a cigarette. She denies any chest pain, chest pressure, shortness of breath, jaw pressure arm pain. She denies any abdominal pain. She denies any diarrhea or constipation. She denies any reflux. Her EKG was within normal limits. Her upper GI showed a tiny hiatal hernia. Problem List/Past Medical (Allison Dlouhy M Tanishia Lemaster, MD; 05/05/2015 1:14 PM) TOBACCO USE (305.1  Z72.0) OBESITY, MORBID, BMI 50 OR HIGHER (278.01  E66.01) UPPER BACK PAIN (724.5  M54.9) ELEVATED TRIGLYCERIDES WITH HIGH CHOLESTEROL (272.2  E78.2) ELEVATED TRANSAMINASE LEVEL (790.4  R74.0)  Other Problems (Allison Cajuste M Tishara Pizano, MD; 05/05/2015 1:14 PM) Hypercholesterolemia Anxiety Disorder Asthma Depression  Past Surgical History (Allison Biswell M Draden Cottingham, MD; 05/05/2015 1:14 PM) Tonsillectomy Cesarean Section - 1  Diagnostic Studies History (Allison Schrader M Laqueisha Catalina, MD; 05/05/2015 1:14 PM) Colonoscopy never Mammogram never Pap Smear 1-5 years ago  Allergies (Allison [name], CMA; 05/05/2015 11:26 AM) No Known Drug Allergies 01/13/2015  Medication History (Allison Sabet M Vernetta Dizdarevic, MD; 05/05/2015 1:14 PM) Azithromycin (250MG Tablet, Oral) Active. No Current Medications (Taken starting 05/05/2015) Nicotine (21MG/24HR Patch 24HR, Transdermal)  Active. PredniSONE (5MG Tablet, Oral) Active. Medications Reconciled OxyCODONE HCl (5MG/5ML Solution, 5-10 Milliliter Oral every four hours, as needed, Taken starting 05/05/2015) Active.  Social History (Allison Almeda M Cobi Aldape, MD; 05/05/2015 1:14 PM) Tobacco use Current every day smoker. No drug use Alcohol use Occasional alcohol use. Caffeine use Carbonated beverages, Coffee.  Family History (Allison Augenstein M Vu Liebman, MD; 05/05/2015 1:14 PM) Alcohol Abuse Brother. Cancer Family Members In General. Thyroid problems Brother, Mother. Colon Cancer Family Members In General. Hypertension Father. Migraine Headache Mother, Sister.  Pregnancy / Birth History (Allison Demeter M Shaelee Forni, MD; 05/05/2015 1:14 PM) Age at menarche 11 years. Gravida 1 Maternal age 26-30 Para 1 Regular periods     Review of Systems (Allison Mares M Miette Molenda, MD; 05/05/2015 1:09 00) General Present- Weight Gain. Not Present- Appetite Loss, Chills, Fatigue, Fever, Night Sweats and Weight Loss. Skin Present- Dryness. Not Present- Change in Wart/Mole, Hives, Jaundice, New Lesions, Non-Healing Wounds, Rash and Ulcer. HEENT Present- Seasonal Allergies. Not Present- Earache, Hearing Loss, Hoarseness, Nose Bleed, Oral Ulcers, Ringing in the Ears, Sinus Pain, Sore Throat, Visual Disturbances, Wears glasses/contact lenses and Yellow Eyes. Respiratory Not Present- Bloody sputum, Chronic Cough, Difficulty Breathing, Snoring and Wheezing. Breast Not Present- Breast Mass, Breast Pain, Nipple Discharge and Skin Changes. Cardiovascular Not Present- Chest Pain, Difficulty Breathing Lying Down, Leg Cramps, Palpitations, Rapid Heart Rate, Shortness of Breath and Swelling of Extremities. Gastrointestinal Not Present- Abdominal Pain, Bloating, Bloody Stool, Change in Bowel Habits, Chronic diarrhea, Constipation, Difficulty Swallowing, Excessive gas, Gets full quickly at meals, Hemorrhoids, Indigestion, Nausea, Rectal Pain and Vomiting. Female Genitourinary  Not Present- Frequency, Nocturia, Painful Urination, Pelvic Pain and Urgency. Musculoskeletal Present- Back Pain and Joint Pain. Not Present- Joint Stiffness, Muscle Pain, Muscle Weakness and Swelling of Extremities. Neurological Not   Present- Decreased Memory, Fainting, Headaches, Numbness, Seizures, Tingling, Tremor, Trouble walking and Weakness. Psychiatric Not Present- Anxiety, Bipolar, Change in Sleep Pattern, Depression, Fearful and Frequent crying. Endocrine Not Present- Cold Intolerance, Excessive Hunger, Hair Changes, Heat Intolerance, Hot flashes and New Diabetes. Hematology Not Present- Easy Bruising, Excessive bleeding, Gland problems, HIV and Persistent Infections.  Vitals Allison [name] CMA; 05/05/2015 11:27 AM) 05/05/2015 11:26 AM Weight: 328.2 lb Height: 66in Body Surface Area: 2.63 m Body Mass Index: 52.97 kg/m Temp.: 98.19F(Oral)  Pulse: 72 (Regular)  BP: 140/80 (Sitting, Left Arm, Standard)     Physical Exam Allison Hiss M. Seymone Forlenza MD; 05/05/2015 1:09 PM)  General Mental Status-Alert. General Appearance-Consistent with stated age. Hydration-Well hydrated. Voice-Normal. Note: morbidly obese; central truncal   Head and Neck Head-normocephalic, atraumatic with no lesions or palpable masses. Trachea-midline. Thyroid Gland Characteristics - normal size and consistency.  Eye Eyeball - Bilateral-Extraocular movements intact. Sclera/Conjunctiva - Bilateral-No scleral icterus.  Chest and Lung Exam Chest and lung exam reveals -quiet, even and easy respiratory effort with no use of accessory muscles and on auscultation, normal breath sounds, no adventitious sounds and normal vocal resonance. Inspection Chest Wall - Normal. Back - normal.  Breast - Did not examine.  Cardiovascular Cardiovascular examination reveals -normal heart sounds, regular rate and rhythm with no murmurs and normal pedal pulses  bilaterally.  Abdomen Inspection Inspection of the abdomen reveals - No Hernias. Skin - Scar - Note: well healed low transverse c/s scar. Palpation/Percussion Palpation and Percussion of the abdomen reveal - Soft, Non Tender, No Rebound tenderness, No Rigidity (guarding) and No hepatosplenomegaly. Auscultation Auscultation of the abdomen reveals - Bowel sounds normal.  Peripheral Vascular Upper Extremity Palpation - Pulses bilaterally normal.  Neurologic Neurologic evaluation reveals -alert and oriented x 3 with no impairment of recent or remote memory. Mental Status-Normal.  Neuropsychiatric The patient's mood and affect are described as -normal. Judgment and Insight-insight is appropriate concerning matters relevant to self.  Musculoskeletal Normal Exam - Left-Upper Extremity Strength Normal and Lower Extremity Strength Normal. Normal Exam - Right-Upper Extremity Strength Normal and Lower Extremity Strength Normal.  Lymphatic Head & Neck  General Head & Neck Lymphatics: Bilateral - Description - Normal. Axillary - Did not examine. Femoral & Inguinal - Did not examine.   Assessment & Plan Allison Hiss M. Declyn Offield MD; 05/05/2015 1:13 PM)  OBESITY, MORBID, BMI 50 OR HIGHER (278.01  E66.01) Impression: We reviewed her preoperative workup. I congratulated her on stopping smoking. I stressed her the importance of continuing to refrain from smoking. She has maintained her weight since her initial visit. She is starting her preoperative diet next week. We discussed the findings of a small hiatal hernia. I recommended testing interoperatively for clinically significant one. If found to have a clinically significant hiatal hernia I recommended going ahead and repairing it at the time of surgery. We discussed what that would involve. She was given her postoperative pain medicine prescription today.  Current Plans Pt Education - EMW_preopbariatric Started OxyCODONE HCl 5MG/5ML, 5-10  Milliliter every four hours, as needed, 200 Milliliter, 05/05/2015, No Refill. UPPER BACK PAIN (724.5  M54.9)  ELEVATED TRIGLYCERIDES WITH HIGH CHOLESTEROL (272.2  E78.2) Impression: We discussed her elevated lipid panel. This should improve with weight loss.  ELEVATED TRANSAMINASE LEVEL (790.4  R74.0) Impression: We discussed her mildly elevated AST and ALT. Probably due to underlying fatty liver. We will monitor her LFTs over time  [name]. [name], MD, FACS General, Bariatric, & Minimally Invasive Surgery [facility], [place]

## 2015-05-23 NOTE — Interval H&P Note (Signed)
History and Physical Interval Note:  05/23/2015 12:10 PM  Allison [name]  has presented today for surgery, with the diagnosis of morbid obesity  The various methods of treatment have been discussed with the patient and family. After consideration of risks, benefits and other options for treatment, the patient has consented to  Procedure(s): LAPAROSCOPIC GASTRIC SLEEVE RESECTION WITH UPPER ENDO POSSIBLE HIATAL HERNIA REPAIR (N/A) as a surgical intervention .  The patient's history has been reviewed, patient examined, no change in status, stable for surgery.  I have reviewed the patient's chart and labs.  Questions were answered to the patient's satisfaction.    [name]. [name], MD, [place], Bariatric, & Minimally Invasive Surgery [name], [place]   [name] M

## 2015-05-23 NOTE — Anesthesia Postprocedure Evaluation (Signed)
  Anesthesia Post-op Note  Patient: Allison [name]  Procedure(s) Performed: Procedure(s): LAPAROSCOPIC GASTRIC SLEEVE RESECTION WITH  HIATAL HERNIA REPAIR AND UPPER ENDOSCOPY (N/A) UPPER GI ENDOSCOPY  Patient Location: PACU  Anesthesia Type:General  Level of Consciousness: sedated, patient cooperative and responds to stimulation  Airway and Oxygen Therapy: Patient Spontanous Breathing and Patient connected to nasal cannula oxygen  Post-op Pain: mild  Post-op Assessment: Post-op Vital signs reviewed, Patient's Cardiovascular Status Stable, Respiratory Function Stable, Patent Airway and Pain level controlled, nausea improved              Post-op Vital Signs: Reviewed and stable  Last Vitals:  Filed Vitals:   05/23/15 1530  BP: 161/87  Pulse: 94  Temp: 36.6 C  Resp: 17    Complications: No apparent anesthesia complications

## 2015-05-23 NOTE — Progress Notes (Signed)
Benadryl 6.25 mg given IV in pacu for nausea - per Dr Glennon Mac

## 2015-05-23 NOTE — Anesthesia Preprocedure Evaluation (Addendum)
Anesthesia Evaluation  Patient identified by MRN, date of birth, ID band Patient awake    Reviewed: Allergy & Precautions, NPO status , Patient's Chart, lab work & pertinent test results  History of Anesthesia Complications Negative for: history of anesthetic complications  Airway Mallampati: III  TM Distance: >3 FB Neck ROM: Full    Dental  (+) Teeth Intact, Dental Advisory Given   Pulmonary asthma , Recent URI , former smoker (quit 3 weeks ago),  breath sounds clear to auscultation        Cardiovascular hypertension (no meds), Rhythm:Regular Rate:Normal     Neuro/Psych Depression negative neurological ROS     GI/Hepatic GERD-  Controlled,H/o elevated LFT's   Endo/Other  Morbid obesity  Renal/GU negative Renal ROS     Musculoskeletal   Abdominal (+) + obese,   Peds  Hematology negative hematology ROS (+)   Anesthesia Other Findings   Reproductive/Obstetrics 05/18/15 preg test: NEG                           Anesthesia Physical Anesthesia Plan  ASA: III  Anesthesia Plan: General   Post-op Pain Management:    Induction: Intravenous  Airway Management Planned: Oral ETT  Additional Equipment:   Intra-op Plan:   Post-operative Plan: Extubation in OR  Informed Consent: I have reviewed the patients History and Physical, chart, labs and discussed the procedure including the risks, benefits and alternatives for the proposed anesthesia with the patient or authorized representative who has indicated his/her understanding and acceptance.   Dental advisory given  Plan Discussed with: CRNA and Surgeon  Anesthesia Plan Comments: (Plan routine monitors, GETA)        Anesthesia Quick Evaluation

## 2015-05-23 NOTE — Op Note (Signed)
05/23/2015 Sandra Cockayne Feb 08, 1986 [id number]   PRE-OPERATIVE DIAGNOSIS:   Principal Problem:   Morbid obesity with BMI of 50.0-59.9, adult   Elevated triglycerides with high cholesterol   Elevated transaminase level Possible hiatal hernia  POST-OPERATIVE DIAGNOSIS:  Same + hiatal hernia + umbilical hernia  PROCEDURE:  Procedure(s): LAPAROSCOPIC SLEEVE GASTRECTOMY WITH HIATAL HERNIA REPAIR UPPER GI ENDOSCOPY  SURGEON:  Surgeon(s): [name], MD FACS FASMBS  ASSISTANTS: Johnathan Hausen MD FACS  ANESTHESIA:   general  DRAINS: none   BOUGIE: 47 fr ViSiGi  LOCAL MEDICATIONS USED:  MARCAINE + Exparel  SPECIMEN:  Source of Specimen:  Greater curvature of stomach  DISPOSITION OF SPECIMEN:  PATHOLOGY  COUNTS:  YES  INDICATION FOR PROCEDURE: This is a very pleasant 29 year old morbidly obese female who has had unsuccessful attempts for sustained weight loss. She presents today for a planned laparoscopic sleeve gastrectomy with upper endoscopy. We have discussed the risk and benefits of the procedure extensively preoperatively. Please see my separate notes.  PROCEDURE: After obtaining informed consent and receiving 5000 units of subcutaneous heparin, the patient was brought to the operating room at [facility] and placed supine on the operating room table. General endotracheal anesthesia was established. Sequential compression devices were placed. A orogastric tube was placed. The patient's abdomen was prepped and draped in the usual standard surgical fashion. She received preoperative IV antibiotics. A surgical timeout was performed.  Access to the abdomen was achieved using a 5 mm 0 laparoscope thru a 5 mm trocar In the left upper Quadrant 2 fingerbreadths below the left subcostal margin using the Optiview technique. Pneumoperitoneum was smoothly established up to 15 mm of mercury. The laparoscope was advanced and the abdominal cavity was surveilled. The patient was  then placed in reverse Trendelenburg. There was no gross evidence of a hiatal hernia on laparoscopy - no gap in the left and right crus anteriorly.  A 5 mm trocar was placed slightly above and to the left of the umbilicus under direct visualization.  The [facility] liver retractor was placed under the left lobe of the liver through a 5 mm trocar incision site in the subxiphoid position. A 5 mm trocar was placed in the lateral right upper quadrant along with a 15 mm trocar in the mid right abdomen. A final 5 mm trocar was placed in the lateral LUQ.  All under direct visualization after local had been infiltrated.  There was a supraumbilical hernia of about 3cm wide.  The stomach was inspected. It was completely decompressed and the orogastric tube was removed.  There was no anterior dimple that was obviously visible. However because her preop UGI showed a small hiatal hernia, I decided to test her for one with a lap band calibration tube balloon. The calibration tube was placed in the oropharynx and guided down into the stomach by the CRNA. 10 mL of air was insufflated into the calibration balloon. The calibration tubing was then gently pulled back by the CRNA and it slid past the GE junction. At this point the calibration tubing was desufflated and pulled back into the esophagus. This confirmed my suspicion of a clinically significant hiatal hernia. The gastrohepatic ligament was incised with harmonic scalpel. The right crus was identified. We identified the crossing fat along the right crus. The adipose tissue just above this area was incised with harmonic scalpel. I then bluntly dissected out this area and identified the left crus. There was evidence of a hiatal hernia. I then mobilized  the esophagus. The left and right crus were further mobilized with blunt dissection. I was then able to reapproximate the left and right crus with 0 Ethibond using an Endostitch suture device and securing it with a titanium  tyknot. We then had the CRNA readvanced the calibration tubing back into the stomach. 10 mL of air was insufflated into the calibration tube balloon. The calibration tube was then gently pulled back and there was resistance at the GE junction. The tube did not slide back up into the esophagus. At this point the calibration tubing was deflated and removed from the patient's body.  We identified the pylorus and measured 6 cm proximal to the pylorus and identified an area of where we would start taking down the short gastric vessels. Harmonic scalpel was used to take down the short gastric vessels along the greater curvature of the stomach. We were able to enter the lesser sac. We continued to march along the greater curvature of the stomach taking down the short gastrics. As we approached the gastrosplenic ligament we took care in this area not to injure the spleen. We were able to take down the entire gastrosplenic ligament. We then mobilized the fundus away from the left crus of diaphragm. There were a few significant posterior gastric avascular attachments which were taken down. This left the stomach completely mobilized. No vessels had been taken down along the lesser curvature of the stomach.  We then reidentified the pylorus. A 36Fr ViSiGi was then placed in the oropharynx and advanced down into the stomach and placed in the distal antrum and positioned along the lesser curvature. It was placed under suction which secured the 36Fr ViSiGi in place along the lesser curve. Then using the Ethicon echelon 60 mm stapler with a green load with Seamguard, I placed a stapler along the antrum approximately 5 cm from the pylorus. The stapler was angled so that there is ample room at the angularis incisura. I then fired the first staple load after inspecting it posteriorly to ensure adequate space both anteriorly and posteriorly. At this point I still was not completely past the angularis so with another green load with  Seamguard, I placed the stapler in position just inside the prior stapleline. We then rotated the stomach to insure that there was adequate anteriorly as well as posteriorly. The stapler was then fired.  At this point I started using 60 mm gold load staple cartridges with Seamguard. The echelon stapler was then repositioned with a 60 mm gold load with Seamguard and we continued to march up along the [place]. My assistant was holding traction along the greater curvature stomach along the cauterized short gastric vessels ensuring that the stomach was symmetrically retracted. Prior to each firing of the staple, we rotated the stomach to ensure that there is adequate stomach left.  As we approached the fundus, I used 60 mm blue cartridge with Seamguard aiming slightly lateral to the esophageal fat pad. Although the staples on this fire had completely gone thru the last part of the stomach it had not completely cut it. Therefore 1 additional 60 blue load was used to free the remaining stomach. The sleeve was inspected. There is no evidence of cork screw. The staple line appeared hemostatic. The CRNA inflated the ViSiGi to the green zone and the upper abdomen was flooded with saline. There were no bubbles. The sleeve was decompressed and the ViSiGi removed. My assistant scrubbed out and performed an upper endoscopy. The sleeve easily distended  with air and the scope was easily advanced to the pylorus. There is no evidence of internal bleeding or cork screwing. There was no narrowing at the angularis. There is no evidence of bubbles. Please see his operative note for further details. The gastric sleeve was decompressed and the endoscope was removed.  The greater curvature the stomach was grasped with a laparoscopic grasper and removed from the 15 mm trocar site.  The liver retractor was removed. I then closed the 15 mm trocar site with 1 interrupted 0 Vicryl sutures through the fascia using the endoclose. The closure was  viewed laparoscopically and it was airtight. 70 cc of Exparel was then infiltrated in the preperitoneal spaces around the trocar sites. Pneumoperitoneum was released. All trocar sites were closed with a 4-0 Monocryl by Dr Hassell Done in a subcuticular fashion followed by the application of Dermabond. The patient was extubated and taken to the recovery room in stable condition. All needle, instrument, and sponge counts were correct x2. There are no immediate complications  (2) 60 mm green with Seamguard (2) 60 mm gold with seamguard (3) 60 mm blue with seamguard  PLAN OF CARE: Admit to inpatient   PATIENT DISPOSITION:  PACU - hemodynamically stable.   Delay start of Pharmacological VTE agent (>24hrs) due to surgical blood loss or risk of bleeding:  no  [name]. [name], MD, FACS FASMBS General, Bariatric, & Minimally Invasive Surgery [facility] Surgery, [place]

## 2015-05-23 NOTE — Op Note (Signed)
Allison [name] [id number] Aug 08, 1986 05/23/2015  Preoperative diagnosis: sleeve gastrectomy for morbid obesity  Postoperative diagnosis: Same   Procedure: Upper endoscopy   Surgeon: Catalina Antigua B. Hassell Done  M.D., FACS   Anesthesia: Gen.   Indications for procedure: This patient was undergoing a sleeve gastrectomy and endoscopy was used to check for bleeding and leaks.    Description of procedure: The endoscopy was placed in the mouth and into the oropharynx and under endoscopic vision it was advanced to the esophagogastric junction.  The pouch was insufflated and the nice uniform sleeve was cannulated with the endoscope to the pylorus.   No bleeding or leaks were detected.  The scope was withdrawn without difficulty.     Matt B. Hassell Done, MD, FACS General, Bariatric, & Minimally Invasive Surgery [facility] Surgery, [place]

## 2015-05-23 NOTE — Progress Notes (Addendum)
Dr Glennon Mac aware of BP. No medication orders received.  Will watch in pacu a little longer.

## 2015-05-23 NOTE — Transfer of Care (Signed)
Immediate Anesthesia Transfer of Care Note  Patient: Allison [name]  Procedure(s) Performed: Procedure(s): LAPAROSCOPIC GASTRIC SLEEVE RESECTION WITH  HIATAL HERNIA REPAIR AND UPPER ENDOSCOPY (N/A) UPPER GI ENDOSCOPY  Patient Location: PACU  Anesthesia Type:General  Level of Consciousness: awake, alert  and oriented  Airway & Oxygen Therapy: Patient Spontanous Breathing and Patient connected to face mask oxygen  Post-op Assessment: Report given to RN and Post -op Vital signs reviewed and stable  Post vital signs: Reviewed and stable  Last Vitals:  Filed Vitals:   05/23/15 1043  BP: 151/97  Pulse: 82  Temp: 36.7 C  Resp: 20    Complications: No apparent anesthesia complications

## 2015-05-23 NOTE — Progress Notes (Signed)
Dilaudid 0.5mg given

## 2015-05-23 NOTE — Anesthesia Procedure Notes (Signed)
Procedure Name: Intubation Date/Time: 05/23/2015 12:39 PM Performed by: Glory Buff Pre-anesthesia Checklist: Patient identified, Emergency Drugs available, Suction available and Patient being monitored Patient Re-evaluated:Patient Re-evaluated prior to inductionOxygen Delivery Method: Circle System Utilized Preoxygenation: Pre-oxygenation with 100% oxygen Intubation Type: IV induction Ventilation: Mask ventilation without difficulty Laryngoscope Size: Mac and 4 Grade View: Grade I Tube type: Oral Tube size: 7.5 mm Number of attempts: 1 Airway Equipment and Method: Stylet and Oral airway Placement Confirmation: ETT inserted through vocal cords under direct vision,  positive ETCO2 and breath sounds checked- equal and bilateral Secured at: 20 cm Tube secured with: Tape Dental Injury: Teeth and Oropharynx as per pre-operative assessment

## 2015-05-24 ENCOUNTER — Encounter (HOSPITAL_COMMUNITY): Payer: Self-pay | Admitting: General Surgery

## 2015-05-24 ENCOUNTER — Inpatient Hospital Stay (HOSPITAL_COMMUNITY): Payer: BLUE CROSS/BLUE SHIELD

## 2015-05-24 LAB — CBC WITH DIFFERENTIAL/PLATELET
BASOS ABS: 0 10*3/uL (ref 0.0–0.1)
BASOS PCT: 0 % (ref 0–1)
EOS ABS: 0 10*3/uL (ref 0.0–0.7)
Eosinophils Relative: 0 % (ref 0–5)
HEMATOCRIT: 42.7 % (ref 36.0–46.0)
HEMOGLOBIN: 13.8 g/dL (ref 12.0–15.0)
Lymphocytes Relative: 9 % — ABNORMAL LOW (ref 12–46)
Lymphs Abs: 1.3 10*3/uL (ref 0.7–4.0)
MCH: 28.6 pg (ref 26.0–34.0)
MCHC: 32.3 g/dL (ref 30.0–36.0)
MCV: 88.4 fL (ref 78.0–100.0)
Monocytes Absolute: 0.9 10*3/uL (ref 0.1–1.0)
Monocytes Relative: 6 % (ref 3–12)
NEUTROS ABS: 12.2 10*3/uL — AB (ref 1.7–7.7)
NEUTROS PCT: 85 % — AB (ref 43–77)
Platelets: 187 10*3/uL (ref 150–400)
RBC: 4.83 MIL/uL (ref 3.87–5.11)
RDW: 13.5 % (ref 11.5–15.5)
WBC: 14.4 10*3/uL — AB (ref 4.0–10.5)

## 2015-05-24 LAB — COMPREHENSIVE METABOLIC PANEL
ALBUMIN: 4.2 g/dL (ref 3.5–5.0)
ALK PHOS: 56 U/L (ref 38–126)
ALT: 64 U/L — ABNORMAL HIGH (ref 14–54)
ANION GAP: 9 (ref 5–15)
AST: 43 U/L — AB (ref 15–41)
BILIRUBIN TOTAL: 0.9 mg/dL (ref 0.3–1.2)
BUN: 9 mg/dL (ref 6–20)
CALCIUM: 9 mg/dL (ref 8.9–10.3)
CO2: 24 mmol/L (ref 22–32)
Chloride: 101 mmol/L (ref 101–111)
Creatinine, Ser: 0.55 mg/dL (ref 0.44–1.00)
GFR calc Af Amer: >60 mL/min (ref >60)
GFR calc non Af Amer: >60 mL/min (ref >60)
GLUCOSE: 152 mg/dL — AB (ref 65–99)
POTASSIUM: 4.2 mmol/L (ref 3.5–5.1)
SODIUM: 134 mmol/L — AB (ref 135–145)
TOTAL PROTEIN: 7.5 g/dL (ref 6.5–8.1)

## 2015-05-24 LAB — HEMOGLOBIN AND HEMATOCRIT, BLOOD
HCT: 44.1 % (ref 36.0–46.0)
Hemoglobin: 14.5 g/dL (ref 12.0–15.0)

## 2015-05-24 MED ORDER — HYDRALAZINE HCL 20 MG/ML IJ SOLN
10.0000 mg | Freq: Once | INTRAMUSCULAR | Status: AC
Start: 2015-05-24 — End: 2015-05-24
  Administered 2015-05-24: 10 mg via INTRAVENOUS
  Filled 2015-05-24: qty 1

## 2015-05-24 MED ORDER — PROMETHAZINE HCL 25 MG/ML IJ SOLN
12.5000 mg | INTRAMUSCULAR | Status: DC | PRN
  Administered 2015-05-24 (×4): 12.5 mg via INTRAVENOUS
  Filled 2015-05-24 (×4): qty 1

## 2015-05-24 MED ORDER — IOHEXOL 300 MG/ML  SOLN
50.0000 mL | Freq: Once | INTRAMUSCULAR | Status: DC | PRN
  Administered 2015-05-24: 50 mL via ORAL
  Filled 2015-05-24: qty 50

## 2015-05-24 MED ORDER — ENALAPRILAT 1.25 MG/ML IV SOLN
1.2500 mg | Freq: Four times a day (QID) | INTRAVENOUS | Status: DC | PRN
  Administered 2015-05-24 (×2): 1.25 mg via INTRAVENOUS
  Filled 2015-05-24 (×5): qty 1

## 2015-05-24 NOTE — Progress Notes (Signed)
Patient alert and oriented, Post op day 1.  Provided support and encouragement.  Encouraged pulmonary toilet, ambulation and small sips of liquids when swallow study returned satisfactory.  All questions answered.  Will continue to monitor.

## 2015-05-24 NOTE — Progress Notes (Signed)
1 Day Post-Op  Subjective: Had fair amount of nausea. Some emesis. Some upper abd discomfort. Had HTN as well.   Objective: Vital signs in last 24 hours: Temp:  [97.7 F (36.5 C)-98.3 F (36.8 C)] 98.1 F (36.7 C) (08/16 0533) Pulse Rate:  [80-104] 99 (08/16 0533) Resp:  [10-20] 18 (08/16 0533) BP: (141-185)/(70-106) 177/100 mmHg (08/16 0533) SpO2:  [93 %-100 %] 95 % (08/16 0533) Weight:  [146.058 kg (322 lb)] 146.058 kg (322 lb) (08/15 1641) Last BM Date: 05/22/15  Intake/Output from previous day: 08/15 0701 - 08/16 0700 In: 1100 [I.V.:1100] Out: 770 [Urine:750; Blood:20] Intake/Output this shift: Total I/O In: -  Out: 450 [Urine:450]  Nontoxic, but not entirely comfortable cta b/l Reg Soft, nd, incisions c/d/i; min TTP +SCDs  Lab Results:   Recent Labs  05/23/15 1518 05/24/15 0526  WBC  --  14.4*  HGB 14.4 13.8  HCT 43.9 42.7  PLT  --  187   BMET  Recent Labs  05/24/15 0526  NA 134*  K 4.2  CL 101  CO2 24  GLUCOSE 152*  BUN 9  CREATININE 0.55  CALCIUM 9.0   PT/INR No results for input(s): LABPROT, INR in the last 72 hours. ABG No results for input(s): PHART, HCO3 in the last 72 hours.  Invalid input(s): PCO2, PO2  Studies/Results: No results found.  Anti-infectives: Anti-infectives    Start     Dose/Rate Route Frequency Ordered Stop   05/23/15 1038  cefoTEtan in Dextrose 5% (CEFOTAN) IVPB 2 g     2 g Intravenous On call to O.R. 05/23/15 1038 05/23/15 1241      Assessment/Plan: s/p Procedure(s): LAPAROSCOPIC GASTRIC SLEEVE RESECTION WITH  HIATAL HERNIA REPAIR AND UPPER ENDOSCOPY (N/A) UPPER GI ENDOSCOPY  Has typical nausea - cont antiemetics For UGI this am, if ok will start POD 1 diet Cont chemical VTE prophylaxis Had elevated BP as outpt but not on home BP meds. Prn vasotec for now Ambulate, pulm toilet  [name]. [name], MD, FACS General, Bariatric, & Minimally Invasive Surgery [facility] Surgery, [place]   LOS: 1 day     [name] 05/24/2015

## 2015-05-24 NOTE — Progress Notes (Signed)
[name].  Gastric Sleeve 8/15.  Had approx 20 cc of blood tinged emesis.  Zofran given, Notified MD, ordered to give the phenergan.  Pt. Is resting at this time with no signs of nausea.

## 2015-05-24 NOTE — Progress Notes (Signed)
RD consulted for nutrition education regarding Bariatric Diet Teaching.   Body mass index is 51 kg/(m^2). Pt meets criteria for Morbid Obesity based on current BMI.  RD provided and discussed handout outlining diet for 2 weeks post-discharge. Emphasized the importance of taking multivitamin with iron, vitamin B12, and calcium/vitamin D and provided suggested schedule to keep track. Provided list of acceptable vitamin and mineral supplements and places where these can be purchased. Discussed progression of diet from water to clear liquids to full liquids over the next 2 weeks. Provided list of acceptable clear liquids and full liquids and emphasized the importance of protein-shake intake. Emphasized the importance of hydration with calorie-free beverages and avoiding sugar-sweetened beverages; suggested intake of 64 ounces of fluids daily. Answered patient's additional questions regarding diet progression. Encouraged pt to discuss physical activity options with physician. Teach back method used.  Expect good compliance.  Current diet order is NPO except water; pt has only tolerated small amounts of ice chips so far. Labs and medications reviewed. No further nutrition interventions warranted at this time. RD contact information provided. If additional nutrition issues arise, please re-consult RD.  Scarlette Ar RD, LDN Inpatient Clinical Dietitian Pager: [phone] After Hours Pager: [phone]

## 2015-05-25 LAB — CBC WITH DIFFERENTIAL/PLATELET
Basophils Absolute: 0 10*3/uL (ref 0.0–0.1)
Basophils Relative: 0 % (ref 0–1)
EOS ABS: 0 10*3/uL (ref 0.0–0.7)
Eosinophils Relative: 0 % (ref 0–5)
HCT: 45.3 % (ref 36.0–46.0)
HEMOGLOBIN: 14.8 g/dL (ref 12.0–15.0)
LYMPHS ABS: 2.3 10*3/uL (ref 0.7–4.0)
LYMPHS PCT: 20 % (ref 12–46)
MCH: 29.1 pg (ref 26.0–34.0)
MCHC: 32.7 g/dL (ref 30.0–36.0)
MCV: 89.2 fL (ref 78.0–100.0)
MONOS PCT: 6 % (ref 3–12)
Monocytes Absolute: 0.6 10*3/uL (ref 0.1–1.0)
NEUTROS PCT: 74 % (ref 43–77)
Neutro Abs: 8.6 10*3/uL — ABNORMAL HIGH (ref 1.7–7.7)
Platelets: 184 10*3/uL (ref 150–400)
RBC: 5.08 MIL/uL (ref 3.87–5.11)
RDW: 13.8 % (ref 11.5–15.5)
WBC: 11.5 10*3/uL — AB (ref 4.0–10.5)

## 2015-05-25 MED ORDER — ENOXAPARIN (LOVENOX) PATIENT EDUCATION KIT
PACK | Freq: Once | Status: AC
  Administered 2015-05-25: 09:00:00
  Filled 2015-05-25: qty 1

## 2015-05-25 MED ORDER — ALUM & MAG HYDROXIDE-SIMETH 200-200-20 MG/5ML PO SUSP: 15.0000 mL | ORAL | Status: DC | PRN

## 2015-05-25 NOTE — Progress Notes (Addendum)
Patient alert and oriented, Post op day 2.  Provided support and encouragement.  Encouraged pulmonary toilet, ambulation and small sips of liquids.  Patient continues to complain of difficulty drinking and feeling full and gas pain.  All questions answered.  Will continue to monitor.

## 2015-05-26 MED ORDER — NICOTINE 7 MG/24HR TD PT24: 7.0000 mg | MEDICATED_PATCH | Freq: Every day | TRANSDERMAL | Status: DC

## 2015-05-26 MED ORDER — PANTOPRAZOLE SODIUM 40 MG PO TBEC: 40.0000 mg | DELAYED_RELEASE_TABLET | Freq: Every day | ORAL | Status: DC

## 2015-05-26 MED ORDER — ENOXAPARIN SODIUM 300 MG/3ML IJ SOLN: 40.0000 mg | Freq: Every day | INTRAMUSCULAR | Status: DC

## 2015-05-26 MED ORDER — ONDANSETRON 4 MG PO TBDP: 4.0000 mg | ORAL_TABLET | Freq: Three times a day (TID) | ORAL | Status: DC | PRN

## 2015-05-26 MED ORDER — ENOXAPARIN SODIUM 300 MG/3ML IJ SOLN: 1.5000 mg/kg | Freq: Every day | INTRAMUSCULAR | Status: DC

## 2015-05-26 MED ORDER — OXYCODONE HCL 5 MG/5ML PO SOLN: 5.0000 mg | ORAL | Status: DC | PRN

## 2015-05-26 NOTE — Progress Notes (Signed)
Patient alert and oriented, pain is controlled. Patient is tolerating fluids, advanced to protein shake yesterday, patient had difficulty with volume, patient is tolerating today.  Reviewed Gastric sleeve discharge instructions with patient and patient is able to articulate understanding.  Provided information on BELT program, Support Group and [facility] outpatient pharmacy. All questions answered, will continue to monitor.

## 2015-05-26 NOTE — Discharge Instructions (Addendum)
° ° ° °GASTRIC BYPASS/SLEEVE ° Home Care Instructions ° ° These instructions are to help you care for yourself when you go home. ° °Call: If you have any problems. °• Call [phone] and ask for the surgeon on call °• If you need immediate assistance come to the ER at [facility]. Tell the ER staff you are a new post-op gastric bypass or gastric sleeve patient  °Signs and symptoms to report: • Severe  vomiting or nausea °o If you cannot handle clear liquids for longer than 1 day, call your surgeon °• Abdominal pain which does not get better after taking your pain medication °• Fever greater than 100.4°  F and chills °• Heart rate over 100 beats a minute °• Trouble breathing °• Chest pain °• Redness,  swelling, drainage, or foul odor at incision (surgical) sites °• If your incisions open or pull apart °• Swelling or pain in calf (lower leg) °• Diarrhea (Loose bowel movements that happen often), frequent watery, uncontrolled bowel movements °• Constipation, (no bowel movements for 3 days) if this happens: °o Take Milk of Magnesia, 2 tablespoons by mouth, 3 times a day for 2 days if needed °o Stop taking Milk of Magnesia once you have had a bowel movement °o Call your doctor if constipation continues °Or °o Take Miralax  (instead of Milk of Magnesia) following the label instructions °o Stop taking Miralax once you have had a bowel movement °o Call your doctor if constipation continues °• Anything you think is “abnormal for you” °  °Normal side effects after surgery: • Unable to sleep at night or unable to concentrate °• Irritability °• Being tearful (crying) or depressed ° °These are common complaints, possibly related to your anesthesia, stress of surgery, and change in lifestyle, that usually go away a few weeks after surgery. If these feelings continue, call your medical doctor.  °Wound Care: You may have surgical glue, steri-strips, or staples over your incisions after surgery °• Surgical glue: Looks like clear  film over your incisions and will wear off a little at a time °• Steri-strips: Adhesive strips of tape over your incisions. You may notice a yellowish color on skin under the steri-strips. This is used to make the steri-strips stick better. Do not pull the steri-strips off - let them fall off °• Staples: Staples may be removed before you leave the hospital °o If you go home with staples, call [facility] for an appointment with your surgeon’s nurse to have staples removed 10 days after surgery, [phone] °• Showering: You may shower two (2) days after your surgery unless your surgeon tells you differently °o Wash gently around incisions with warm soapy water, rinse well, and gently pat dry °o If you have a drain (tube from your incision), you may need someone to hold this while you shower °o No tub baths until staples are removed and incisions are healed °  °Medications: • Medications should be liquid or crushed if larger than the size of a dime °• Extended release pills (medication that releases a little bit at a time through the  day) should not be crushed °• Depending on the size and number of medications you take, you may need to space (take a few throughout the day)/change the time you take your medications so that you do not over-fill your pouch (smaller stomach) °• Make sure you follow-up with you primary care physician to make medication changes needed during rapid weight loss and life -style changes °•   If you have diabetes, follow up with your doctor that orders your diabetes medication(s) within one week after surgery and check your blood sugar regularly   Do not drive while taking narcotics (pain medications)    Diet:  First 2 Weeks You will see the nutritionist about two (2) weeks after your surgery. The nutritionist will increase the types of foods you can eat if you are handling liquids well:  If you have severe vomiting or nausea and cannot handle clear liquids lasting  longer than 1 day call your surgeon Protein Shake  Drink at least 2 ounces of shake 5-6 times per day  Each serving of protein shakes (usually 8-12 ounces) should have a minimum of: o 15 grams of protein o And no more than 5 grams of carbohydrate  Goal for protein each day: o Men = 80 grams per day o Women = 60 grams per day     Protein powder may be added to fluids such as non-fat milk or Lactaid milk or Soy milk (limit to 35 grams added protein powder per serving)  Hydration  Slowly increase the amount of water and other clear liquids as tolerated (See Acceptable Fluids)  Slowly increase the amount of protein shake as tolerated  Sip fluids slowly and throughout the day  May use sugar substitutes in small amounts (no more than 6-8 packets per day; i.e. Splenda)  Fluid Goal  The first goal is to drink at least 8 ounces of protein shake/drink per day (or as directed by the nutritionist); some examples of protein shakes are [employer], [employer], EAS Edge HP, and Unjury. - See handout from pre-op Bariatric Education Class: o Slowly increase the amount of protein shake you drink as tolerated o You may find it easier to slowly sip shakes throughout the day o It is important to get your proteins in first  Your fluid goal is to drink 64-100 ounces of fluid daily o It may take a few weeks to build up to this   32 oz. (or more) should be clear liquids And  32 oz. (or more) should be full liquids (see below for examples)  Liquids should not contain sugar, caffeine, or carbonation  Clear Liquids:  Water of Sugar-free flavored water (i.e. Fruit HO, Propel)  Decaffeinated coffee or tea (sugar-free)  Crystal lite, Wylers Lite, Minute Maid Lite  Sugar-free Jell-O  Bouillon or broth  Sugar-free Popsicle:    - Less than 20 calories each; Limit 1 per day  Full Liquids:                   Protein Shakes/Drinks + 2 choices per day of other full liquids  Full  liquids must be: o No More Than 12 grams of Carbs per serving o No More Than 3 grams of Fat per serving  Strained low-fat cream soup  Non-Fat milk  Fat-free Lactaid Milk  Sugar-free yogurt (Dannon Lite & Fit, Greek yogurt)    Vitamins and Minerals  Start 1 day after surgery unless otherwise directed by your surgeon  2 Chewable Multivitamin / Multimineral Supplement with iron (i.e. Centrum for Adults)  Vitamin B-12, 350-500 micrograms sub-lingual (place tablet under the tongue) each day  Chewable Calcium Citrate with Vitamin D-3 (Example: 3 Chewable Calcium  Plus 600 with Vitamin D-3) o Take 500 mg three (3) times a day for a total of 1500 mg each day o Do not take all 3 doses of calcium at one time as it  may cause constipation, and you can only absorb 500 mg at a time o Do not mix multivitamins containing iron with calcium supplements;  take 2 hours apart o Do not substitute Tums (calcium carbonate) for your calcium  Menstruating women and those at risk for anemia ( a blood disease that causes weakness) may need extra iron o Talk to your doctor to see if you need more iron  If you need extra iron: Total daily Iron recommendation (including Vitamins) is 50 to 100 mg Iron/day  Do not stop taking or change any vitamins or minerals until you talk to your nutritionist or surgeon  Your nutritionist and/or surgeon must approve all vitamin and mineral supplements   Activity and Exercise: It is important to continue walking at home. Limit your physical activity as instructed by your doctor. During this time, use these guidelines:  Do not lift anything greater than ten  (10) pounds for at least two (2) weeks  Do not go back to work or drive until Engineer, production says you can  You may have sex when you feel comfortable o It is VERY important for female patients to use a reliable birth control method; fertility often increase after surgery o Do not get pregnant for at least 18  months  Start exercising as soon as your doctor tells you that you can o Make sure your doctor approves any physical activity  Start with a simple walking program  Walk 5-15 minutes each day, 7 days per week  Slowly increase until you are walking 30-45 minutes per day  Consider joining our [place] program. [phone] or email [email or web address]   Special Instructions Things to remember:  Free counseling is available for you and your family through collaboration between [facility] and [place]. Please call [phone] and leave a message  Use your CPAP when sleeping if this applies to you  Consider buying a medical alert bracelet that says you had lap-band surgery     You will likely have your first fill (fluid added to your band) 6 - 8 weeks after surgery  [facility] has a free Bariatric Surgery Support Group that meets monthly, the 3rd Thursday, [place]. You can see classes online at [email or web address]  It is very important to keep all follow up appointments with your surgeon, nutritionist, primary care physician, and behavioral health practitioner o After the first year, please follow up with your bariatric surgeon and nutritionist at least once a year in order to maintain best weight loss results                    [facility]:  [place]: [phone]               Bariatric Nurse Coordinator: [phone]  Gastric Bypass/Sleeve Home Care Instructions  Rev. 11/2012                                                         Reviewed and Vilinda Boehringer  by [facility] Patient Education Committee, Jan, 2014         Enoxaparin, Home Use Enoxaparin (Lovenox) injection is a medication used to prevent clots from developing in your veins. Medications such as enoxaparin are called blood thinners or  anticoagulants. If blood clots are untreated they could travel to your lungs. This is called a pulmonary embolus. A blood clot in your lungs can be fatal. Caregivers often use anticoagulants such as enoxaparin to prevent clots following surgery. It is also used along with aspirin when the heart is not getting enough blood. Continue the enoxaparin injections as directed by your caregiver. Your caregiver will use blood clotting test results to decide when you can safely stop using enoxaparin injections. If your caregiver prescribes any additional anticoagulant, you must take it exactly as directed. RISKS AND COMPLICATIONS  If you have received recent epidural anesthesia, spinal anesthesia, or a spinal tap while receiving anticoagulants, you are at risk for developing a blood clot in or around the spine. This condition could result in long-term or permanent paralysis.  Because anticoagulants thin your blood, severe bleeding may occur from any tissue or organ. Symptoms of the blood being too thin may include:  Bleeding from the nose or gums that does not stop quickly.  Unusual bruising or bruising easily.  Swelling or pain at an injection site.  A cut that does not stop bleeding within 10 minutes.  Continual nausea for more than 1 day or vomiting blood.  Coughing up blood.  Blood in the urine which may appear as pink, red, or brown urine.  Blood in bowel movements which may appear as red, dark or black stools.  Sudden weakness or numbness of the face, arm, or leg, especially on one side of the body.  Sudden confusion.  Trouble speaking (aphasia) or understanding.  Sudden trouble seeing in one or both eyes.  Sudden trouble walking.  Dizziness.  Loss of balance or coordination.  Severe pain, such as a headache, joint pain, or back pain.  Fever.  Bruising around the injection sites may be expected.  Platelet drops, known as "thrombocytopenia," can occur with enoxaparin use. A  condition called "heparin-induced thrombocytopenia" has been seen. If you have had this condition, you should tell your caregiver. Your caregiver may direct you to have blood tests to monitor this condition.  Do not use if you have allergies to the medication, heparin, or pork products.  Other side effects may include mild local reactions or irritation at the site of injection, pain, bruising, and redness of skin. HOME CARE INSTRUCTIONS You will be instructed by your caregiver how to give enoxaparin injections. 1. Before giving your medication you should make sure the injection is a clear and colorless or pale yellow solution. If your medication becomes discolored or has particles in the bottle, do not use and notify your caregiver. 2. When using the 30 and 40 mg pre-filled syringes, do not expel the air bubble from the syringe before the injection. This makes sure you use all the medication in the syringe. 3. The injections will be given subcutaneously. This means it is given into the fat over the belly (abdomen). It is given deep beneath the skin but not into the muscle. The shots should be injected around the abdominal wall. Change the sites of injection each time. The whole length of the needle should be introduced into a skin fold held between the thumb and forefinger; the skin fold should be held throughout the injection. Do  not rub the injection site after completion of the injection. This increases bruising. Enoxaparin injection pre-filled syringes and graduated pre-filled syringes are available with a system that shields the needle after injection. 4. Inject by pushing the plunger to the bottom of the syringe. 5. Remove the syringe from the injection site keeping your finger on the plunger rod. Be careful not to stick yourself or others. 6. After injection and the syringe is empty, set off the safety system by firmly pushing the plunger rod. The protective sleeve will automatically cover the  needle and you can hear a click. The click means your needle is safely covered. Do not try replacing the needle shield. 7. Get rid of the syringe in the nearest sharps container. 8. Keep your medication safely stored at room temperatures.  Due to the complications of anticoagulants, it is very important that you take your anticoagulant as directed by your caregiver. Anticoagulants need to be taken exactly as instructed. Be sure you understand all your anticoagulant instructions.  Changes in medicines, supplements, diet, and illness can affect your anticoagulation therapy. Be sure to inform your caregivers of any of these changes.  While on anticoagulants, you will need to have blood tests done routinely as directed by your caregivers.  Be careful not to cut yourself when using sharp objects.  Limit physical activities or sports that could result in a fall or cause injury.  It is extremely important that you tell all of your caregivers and dentist that you are taking an anticoagulant, especially if you are injured or plan to have any type of procedure or operation.  Follow up with your laboratory test and caregiver appointments as directed. It is very important to keep your appointments. Not keeping appointments could result in a chronic or permanent injury, pain, or disability. SEEK MEDICAL CARE IF:  You develop any rashes.  You have any worsening of the condition for which you are receiving anticoagulation therapy. SEEK IMMEDIATE MEDICAL CARE IF:  Bleeding from the nose or gums does not stop quickly.  You have unusual bruising or are bruising easily.  Swelling or pain occurs at an injection site.  A cut does not stop bleeding within 10 minutes.  You have continual nausea for more than 1 day or are vomiting blood.  You are coughing up blood.  You have blood in the urine.  You have dark or black stools.  You have sudden weakness or numbness of the face, arm, or leg, especially  on one side of the body.  You have sudden confusion.  You have trouble speaking (aphasia) or understanding.  You have sudden trouble seeing in one or both eyes.  You have sudden trouble walking.  You have dizziness.  You have a loss of balance or coordination.  You have severe pain, such as a headache, joint pain, or back pain.  You have a serious fall or head injury, even if you are not bleeding.  You have an oral temperature above 102 F (38.9 C), not controlled by medicine. ANY OF THESE SYMPTOMS MAY REPRESENT A SERIOUS PROBLEM THAT IS AN EMERGENCY. Do not wait to see if the symptoms will go away. Get medical help right away. Call your local emergency services (911 in [place] DO NOT drive yourself to the hospital. MAKE SURE YOU:  Understand these instructions.  Will watch your condition.  Will get help right away if you are not doing well or get worse. Document Released: 07/26/2004 Document Revised: 12/17/2011 Document Reviewed: 09/24/2005  ExitCare® Patient Information ©2015 [employer]. This information is not intended to replace advice given to you by your health care provider. Make sure you discuss any questions you have with your health care provider. ° °

## 2015-05-26 NOTE — Progress Notes (Signed)
Delayed note entry.   Pt had some nausea overnight. Has mild cough. Ambulated. Gets full real quickly. BP better  AFVSS  Alert, appears a little nauseous CTA with airway cough Reg Obese, soft, min TTP, incisions c/d/i No edema  Will adv to POD 2 diet May need 1 more day Cont chemical vTE prophylaxis  Checked on throughout day via bariatric nurse - not getting in enough liquids for safe dc. Will keep overnight.   [name]. [name], MD, FACS General, Bariatric, & Minimally Invasive Surgery [facility], [place]

## 2015-05-26 NOTE — Progress Notes (Signed)
Assessment unchanged. Ambulating. Tolerating shakes. Vilinda Flake, RN completed discharge teaching with verbalized understanding from pt. Pt understands follow up care and when to call doctor. Pt discharged with Lovenox to continue for approx. 2 more weeks. Pt self administered SQ dose this am with good aseptic technique. Pt will receive multi-dose vial of med at home pharmacy therefore prior to dc pt taught how to draw up med from vial using vial of sterile H2O. Pt verbalized and demonstrated understanding of technique. Discharged via wc to front entrance to meet awaiting vehicle to carry home. Accompanied by NT and father.

## 2015-05-26 NOTE — Discharge Summary (Signed)
Physician Discharge Summary  Allison [name] VPX:[id number] DOB: [birth year] DOA: 05/23/2015  PCP: Elsie Stain, MD  Admit date: 05/23/2015 Discharge date: 05/26/2015  Recommendations for Outpatient Follow-up:   Follow-up Information    Follow up with [name], MD. Go on 06/03/2015.   Specialty:  General Surgery   Why:  For Post-Op Check at 1:30   Contact information:   [place] [address] [phone]      Discharge Diagnoses:  Principal Problem:   Morbid obesity with BMI of 50.0-59.9, adult Active Problems:   Elevated triglycerides with high cholesterol   Elevated transaminase level   S/P laparoscopic sleeve gastrectomy   Surgical Procedure: Laparoscopic Sleeve Gastrectomy, upper endoscopy  Discharge Condition: Good Disposition: Home  Diet recommendation: Postoperative sleeve gastrectomy diet (liquids only)  Filed Weights   05/23/15 1641 05/25/15 0655 05/26/15 0741  Weight: 146.058 kg (322 lb) 146 kg (321 lb 14 oz) 142.4 kg (313 lb 15 oz)     Hospital Course:  The patient was admitted for a planned laparoscopic sleeve gastrectomy. Please see operative note. Preoperatively the patient was given 5000 units of subcutaneous heparin for DVT prophylaxis. Postoperative prophylactic Lovenox dosing was started on the morning of postoperative day 1. The patient underwent an upper GI on postoperative day 1 which demonstrated no extravasation of contrast and emptying of the contrast into the small bowel. The patient was started on ice chips and water which they tolerated. On postoperative day 2 The patient's diet was advanced to protein shakes which they also tolerated however her volume was not adequate or safe for discharge.  The patient was ambulating without difficulty. Their vital signs are stable without fever or tachycardia. Their hemoglobin had remained stable. On postoperative day 3, she looked much more comfortable.  She was taking in higher  volumes of both water and protein shake. She was afebrile and not tachycardic.The patient had received discharge instructions and counseling. They were deemed stable for discharge.  Because of her recent smoking history I elected to send her out on extended DVT prophylaxis consisting of 40 mg of Lovenox once a day.  She was given Neurosurgeon as well as shown how to do Lovenox injections.  BP 130/87 mmHg  Pulse 96  Temp(Src) 98.1 F (36.7 C) (Oral)  Resp 18  Ht 5' 6.5" (1.689 m)  Wt 142.4 kg (313 lb 15 oz)  BMI 49.92 kg/m2  SpO2 99%  LMP 05/08/2015  Gen: alert, NAD, non-toxic appearing Pupils: equal, no scleral icterus Pulm: Lungs clear to auscultation, symmetric chest rise CV: regular rate and rhythm Abd: soft, min to nontender, nondistended. Some bruising at extraction site. No cellulitis. No incisional hernia Ext: no edema, no calf tenderness Skin: no rash, no jaundice  Discharge Instructions  Discharge Instructions    Ambulate hourly while awake    Complete by:  As directed      Call MD for:  difficulty breathing, headache or visual disturbances    Complete by:  As directed      Call MD for:  persistant dizziness or light-headedness    Complete by:  As directed      Call MD for:  persistant nausea and vomiting    Complete by:  As directed      Call MD for:  redness, tenderness, or signs of infection (pain, swelling, redness, odor or green/yellow discharge around incision site)    Complete by:  As directed      Call MD  for:  severe uncontrolled pain    Complete by:  As directed      Call MD for:  temperature >101 F    Complete by:  As directed      Diet bariatric full liquid    Complete by:  As directed      Discharge instructions    Complete by:  As directed   See bariatric discharge instructions     Incentive spirometry    Complete by:  As directed   Perform hourly while awake            Medication List    TAKE these medications        enoxaparin  300 MG/3ML Soln injection  Commonly known as:  LOVENOX  Inject 0.4 mLs (40 mg total) into the skin daily.     nicotine 7 mg/24hr patch  Commonly known as:  NICODERM CQ - dosed in mg/24 hr  Place 1 patch (7 mg total) onto the skin daily.     ondansetron 4 MG disintegrating tablet  Commonly known as:  ZOFRAN ODT  Take 1 tablet (4 mg total) by mouth every 8 (eight) hours as needed for nausea or vomiting.     oxyCODONE 5 MG/5ML solution  Commonly known as:  ROXICODONE  Take 5-10 mLs (5-10 mg total) by mouth every 4 (four) hours as needed for moderate pain or severe pain.     pantoprazole 40 MG tablet  Commonly known as:  PROTONIX  Take 1 tablet (40 mg total) by mouth daily.           Follow-up Information    Follow up with [name], MD. Go on 06/03/2015.   Specialty:  General Surgery   Why:  For Post-Op Check at 1:30   Contact information:   [place] [address] [phone]        The results of significant diagnostics from this hospitalization (including imaging, microbiology, ancillary and laboratory) are listed below for reference.    Significant Diagnostic Studies: Dg Ugi W/water Sol Cm  05/24/2015   CLINICAL DATA:  29 year old female 1 day status post sleeve gastrectomy.  EXAM: UPPER GI SERIES WITH KUB  TECHNIQUE: After obtaining a scout radiograph a routine upper GI series was performed using water-soluble oral contrast (50 cc Omnipaque 300).  COMPARISON:  02/04/2015 preoperative upper GI.  FLUOROSCOPY TIME:  Fluoroscopy Time (in minutes and seconds): 1 minutes 6 seconds.  Number of Acquired Images:  3.  FINDINGS: Scout radiograph demonstrates surgical sutures in the left upper quadrant of the abdomen, with no dilated small bowel loops, pneumoperitoneum or pneumatosis. Swallowed water-soluble contrast traversed the esophagus into the stomach, duodenum and proximal small bowel without significant delay. There is narrowing of the body of the  stomach, as expected status post sleeve gastrectomy there is no evidence of an extraluminal contrast leak. No gross filling defects or significant strictures are detected.  IMPRESSION: Satisfactory appearance status post sleeve gastrectomy, with no evidence of leak or gastric outlet obstruction.   Electronically Signed   By: Ilona Sorrel M.D.   On: 05/24/2015 12:15    Labs: Basic Metabolic Panel:  Recent Labs Lab 05/24/15 0526  NA 134*  K 4.2  CL 101  CO2 24  GLUCOSE 152*  BUN 9  CREATININE 0.55  CALCIUM 9.0   Liver Function Tests:  Recent Labs Lab 05/24/15 0526  AST 43*  ALT 64*  ALKPHOS 56  BILITOT 0.9  PROT 7.5  ALBUMIN 4.2    CBC:  Recent Labs Lab 05/23/15 1518 05/24/15 0526 05/24/15 1646 05/25/15 0559  WBC  --  14.4*  --  11.5*  NEUTROABS  --  12.2*  --  8.6*  HGB 14.4 13.8 14.5 14.8  HCT 43.9 42.7 44.1 45.3  MCV  --  88.4  --  89.2  PLT  --  187  --  184    CBG: No results for input(s): GLUCAP in the last 168 hours.  Principal Problem:   Morbid obesity with BMI of 50.0-59.9, adult Active Problems:   Elevated triglycerides with high cholesterol   Elevated transaminase level   S/P laparoscopic sleeve gastrectomy   Time coordinating discharge: 15 minutes  Signed:  [name], MD [facility], [place] [phone] 05/26/2015, 6:17 PM

## 2015-05-30 ENCOUNTER — Telehealth (HOSPITAL_COMMUNITY): Payer: Self-pay

## 2015-05-30 NOTE — Telephone Encounter (Signed)
Made discharge phone call to patient per DROP protocol. Asking the following questions.    1. Do you have someone to care for you now that you are home?  yes 2. Are you having pain now that is not relieved by your pain medication? no   3. Are you able to drink the recommended daily amount of fluids (48 ounces minimum/day) and protein (60-80 grams/day) as prescribed by the dietitian or nutritional counselor?  Yes,well I am close 4. Are you taking the vitamins and minerals as prescribed?  yes 5. Do you have the "on call" number to contact your surgeon if you have a problem or question?  yes 6. Are your incisions free of redness, swelling or drainage? (If steri strips, address that these can fall off, shower as tolerated) yes 7. Have your bowels moved since your surgery?  If not, are you passing gas?  yes 8. Are you up and walking 3-4 times per day?  yes    1. Do you have an appointment made to see your surgeon in the next month?  yes 2. Were you provided your discharge medications before your surgery or before you were discharged from the hospital and are you taking them without problem? yes   3. Were you provided phone numbers to the clinic/surgeon's office?  yes 4. Did you watch the patient education video module in the (clinic, surgeon's office, etc.) before your surgery? no 5. Do you have a discharge checklist that was provided to you in the hospital to reference with instructions on how to take care of yourself after surgery?  yes 6. Did you see a dietitian or nutritional counselor while you were in the hospital?  yes 7. Do you have an appointment to see a dietitian or nutritional counselor in the next month? yes

## 2015-06-07 ENCOUNTER — Encounter: Payer: BLUE CROSS/BLUE SHIELD | Attending: General Surgery

## 2015-06-07 VITALS — Ht 66.0 in | Wt 306.0 lb

## 2015-06-07 DIAGNOSIS — Z713 Dietary counseling and surveillance: Secondary | ICD-10-CM | POA: Insufficient documentation

## 2015-06-07 DIAGNOSIS — Z6841 Body Mass Index (BMI) 40.0 and over, adult: Secondary | ICD-10-CM | POA: Diagnosis not present

## 2015-06-07 DIAGNOSIS — E669 Obesity, unspecified: Secondary | ICD-10-CM | POA: Insufficient documentation

## 2015-06-07 NOTE — Progress Notes (Signed)
Bariatric Class:  Appt start time: 1530 end time:  1630.  2 Week Post-Operative Nutrition Class  Patient was seen on 06/07/2015 for Post-Operative Nutrition education at the [facility].   Surgery date: 05/23/2015 Surgery type: Sleeve Gastrectomy Start weight at [facility]: 326 lbs on 02/16/15, 327.5 lbs on 04/18/15 Weight today: 306.0 lbs Weight change: 21.5 lbs  TANITA  BODY COMP RESULTS  04/18/15 06/07/15   BMI (kg/m^2) 52.9 49.4   Fat Mass (lbs) 179 169.5   Fat Free Mass (lbs) 148.5 136.5   Total Body Water (lbs) 108.5 100.0    The following the learning objectives were met by the patient during this course:  Identifies Phase 3A (Soft, High Proteins) Dietary Goals and will begin from 2 weeks post-operatively to 2 months post-operatively  Identifies appropriate sources of fluids and proteins   States protein recommendations and appropriate sources post-operatively  Identifies the need for appropriate texture modifications, mastication, and bite sizes when consuming solids  Identifies appropriate multivitamin and calcium sources post-operatively  Describes the need for physical activity post-operatively and will follow MD recommendations  States when to call healthcare provider regarding medication questions or post-operative complications  Handouts given during class include:  Phase 3A: Soft, High Protein Diet Handout  Follow-Up Plan: Patient will follow-up at [facility] in 6 weeks for 2 month post-op nutrition visit for diet advancement per MD.

## 2015-06-23 ENCOUNTER — Other Ambulatory Visit: Payer: Self-pay

## 2015-06-23 DIAGNOSIS — K9189 Other postprocedural complications and disorders of digestive system: Principal | ICD-10-CM

## 2015-06-23 DIAGNOSIS — K838 Other specified diseases of biliary tract: Secondary | ICD-10-CM

## 2015-07-18 ENCOUNTER — Ambulatory Visit: Payer: BLUE CROSS/BLUE SHIELD | Admitting: Dietician

## 2015-08-27 ENCOUNTER — Emergency Department (HOSPITAL_COMMUNITY)
Admission: EM | Admit: 2015-08-27 | Discharge: 2015-08-27 | Disposition: A | Discharge status: Home / Self Care | Payer: BLUE CROSS/BLUE SHIELD | Attending: Emergency Medicine | Admitting: Emergency Medicine

## 2015-08-27 ENCOUNTER — Encounter (HOSPITAL_COMMUNITY): Payer: Self-pay | Admitting: Emergency Medicine

## 2015-08-27 ENCOUNTER — Emergency Department (HOSPITAL_COMMUNITY): Payer: BLUE CROSS/BLUE SHIELD

## 2015-08-27 DIAGNOSIS — S61431A Puncture wound without foreign body of right hand, initial encounter: Secondary | ICD-10-CM | POA: Insufficient documentation

## 2015-08-27 DIAGNOSIS — Z79899 Other long term (current) drug therapy: Secondary | ICD-10-CM | POA: Diagnosis not present

## 2015-08-27 DIAGNOSIS — S61234A Puncture wound without foreign body of right ring finger without damage to nail, initial encounter: Secondary | ICD-10-CM | POA: Diagnosis not present

## 2015-08-27 DIAGNOSIS — W540XXA Bitten by dog, initial encounter: Secondary | ICD-10-CM | POA: Insufficient documentation

## 2015-08-27 DIAGNOSIS — Y9389 Activity, other specified: Secondary | ICD-10-CM | POA: Insufficient documentation

## 2015-08-27 DIAGNOSIS — I10 Essential (primary) hypertension: Secondary | ICD-10-CM | POA: Insufficient documentation

## 2015-08-27 DIAGNOSIS — J45909 Unspecified asthma, uncomplicated: Secondary | ICD-10-CM | POA: Diagnosis not present

## 2015-08-27 DIAGNOSIS — Y9289 Other specified places as the place of occurrence of the external cause: Secondary | ICD-10-CM | POA: Diagnosis not present

## 2015-08-27 DIAGNOSIS — Z8659 Personal history of other mental and behavioral disorders: Secondary | ICD-10-CM | POA: Diagnosis not present

## 2015-08-27 DIAGNOSIS — S61451A Open bite of right hand, initial encounter: Secondary | ICD-10-CM

## 2015-08-27 DIAGNOSIS — Z87891 Personal history of nicotine dependence: Secondary | ICD-10-CM | POA: Insufficient documentation

## 2015-08-27 DIAGNOSIS — Z8639 Personal history of other endocrine, nutritional and metabolic disease: Secondary | ICD-10-CM | POA: Insufficient documentation

## 2015-08-27 DIAGNOSIS — Y998 Other external cause status: Secondary | ICD-10-CM | POA: Diagnosis not present

## 2015-08-27 DIAGNOSIS — S51831A Puncture wound without foreign body of right forearm, initial encounter: Secondary | ICD-10-CM | POA: Insufficient documentation

## 2015-08-27 DIAGNOSIS — S59911A Unspecified injury of right forearm, initial encounter: Secondary | ICD-10-CM | POA: Diagnosis present

## 2015-08-27 MED ORDER — ACETAMINOPHEN 500 MG PO TABS
1000.0000 mg | ORAL_TABLET | Freq: Once | ORAL | Status: AC
  Administered 2015-08-27: 1000 mg via ORAL
  Filled 2015-08-27: qty 2

## 2015-08-27 MED ORDER — SODIUM CHLORIDE 0.9 % IV SOLN
3.0000 g | Freq: Once | INTRAVENOUS | Status: AC
  Administered 2015-08-27: 21:00:00 via INTRAVENOUS

## 2015-08-27 MED ORDER — LIDOCAINE HCL (PF) 1 % IJ SOLN
5.0000 mL | Freq: Once | INTRAMUSCULAR | Status: AC
  Administered 2015-08-27: 5 mL
  Filled 2015-08-27: qty 5

## 2015-08-27 MED ORDER — HYDROCODONE-ACETAMINOPHEN 5-325 MG PO TABS: 1.0000 | ORAL_TABLET | ORAL | Status: DC | PRN

## 2015-08-27 MED ORDER — AMOXICILLIN-POT CLAVULANATE 875-125 MG PO TABS
1.0000 | ORAL_TABLET | Freq: Once | ORAL | Status: AC
  Administered 2015-08-27: 1 via ORAL
  Filled 2015-08-27: qty 1

## 2015-08-27 MED ORDER — AMOXICILLIN-POT CLAVULANATE 875-125 MG PO TABS: 1.0000 | ORAL_TABLET | Freq: Two times a day (BID) | ORAL | Status: DC

## 2015-08-27 MED ORDER — SODIUM CHLORIDE 0.9 % IV SOLN
INTRAVENOUS | Status: AC
  Filled 2015-08-27: qty 3

## 2015-08-27 NOTE — ED Provider Notes (Signed)
CSN: [id number]     Arrival date & time 08/27/15  1806 History   First MD Initiated Contact with Patient 08/27/15 1903     Chief Complaint  Patient presents with  . Animal Bite     (Consider location/radiation/quality/duration/timing/severity/associated sxs/prior Treatment) Patient is a 29 y.o. female presenting with animal bite. The history is provided by the patient.  Animal Bite Contact animal:  Dog Location:  Shoulder/arm Shoulder/arm injury location:  R wrist, R forearm and R hand Pain details:    Quality:  Sharp   Severity:  Moderate   Timing:  Constant   Progression:  Worsening Incident location:  Home Notifications:  Animal control Animal's rabies vaccination status:  Up to date Animal in possession: yes   Tetanus status:  Up to date Relieved by:  Nothing Worsened by:  Nothing tried Associated symptoms: swelling    Allison [name] is a 29 y.o. female who presents to the ED with puncture wounds to the right forearm, wrist and hand. She states that one of her dogs has puppies and she was trying to keep her other dog away but he got in the area with the mother dog and they started to fight. She tried to break up the fight and got bitten. She complains of pain, burning and swelling to the right hand, wrist and forearm. She reports that when she turns her wrist she has severe pain that radiates to the forearm.  She denies other injuries.  Past Medical History  Diagnosis Date  . Asthma   . Hyperlipidemia   . Migraine   . Depression     In counseling with Dr. Pablo Ledger in [place]  . Hypertension   . Pregnancy induced hypertension    Past Surgical History  Procedure Laterality Date  . Tonsillectomy    . Wisdom tooth extraction  2007  . Cesarean section  07/03/2012    Procedure: CESAREAN SECTION;  Surgeon: Ena Dawley, MD;  Location: [facility];  Service: Obstetrics;  Laterality: N/A;  . Breath tek h pylori N/A 02/04/2015    Procedure: BREATH TEK H PYLORI;  Surgeon: Greer Pickerel, MD;  Location: [facility];  Service: General;  Laterality: N/A;  . Laparoscopic gastric sleeve resection with hiatal hernia repair N/A 05/23/2015    Procedure: LAPAROSCOPIC GASTRIC SLEEVE RESECTION WITH  HIATAL HERNIA REPAIR AND UPPER ENDOSCOPY;  Surgeon: Greer Pickerel, MD;  Location: [facility];  Service: General;  Laterality: N/A;  . Upper gi endoscopy  05/23/2015    Procedure: UPPER GI ENDOSCOPY;  Surgeon: Greer Pickerel, MD;  Location: [facility];  Service: General;;   Family History  Problem Relation Age of Onset  . Hypertension Mother   . Depression Mother   . Hypothyroidism Mother   . Hypertension Father   . Depression Other   . Heart disease Paternal Aunt   . Cancer Maternal Grandfather     colon cancer  . Mental illness Sister   . Hypothyroidism Brother   . Alcohol abuse Brother   . Hypothyroidism Maternal Grandmother   . Hypertension Paternal Grandmother    Social History  Substance Use Topics  . Smoking status: Former Smoker -- 1.00 packs/day for 8 years    Types: Cigarettes    Quit date: 11/08/2014  . Smokeless tobacco: Never Used  . Alcohol Use: No   OB History    Gravida Para Term Preterm AB TAB SAB Ectopic Multiple Living   1 1 1  0 0 0 0 0 0 1  Review of Systems  Musculoskeletal: Positive for joint swelling and arthralgias.       Pain to the right hand, wrist and forearm  Skin: Positive for color change (bruising) and wound.   All other systems negative   Allergies  Review of patient's allergies indicates no known allergies.  Home Medications   Prior to Admission medications   Medication Sig Start Date End Date Taking? Authorizing Provider  albuterol (PROVENTIL HFA;VENTOLIN HFA) 108 (90 BASE) MCG/ACT inhaler Inhale 2 puffs into the lungs every 6 (six) hours as needed for wheezing or shortness of breath.   Yes Historical Provider, MD  flintstones complete (FLINTSTONES) 60 MG chewable tablet Chew 1 tablet by mouth daily.   Yes Historical Provider, MD    amoxicillin-clavulanate (AUGMENTIN) 875-125 MG tablet Take 1 tablet by mouth every 12 (twelve) hours. 08/27/15   Cherree Conerly Bunnie Pion, NP  HYDROcodone-acetaminophen (NORCO/VICODIN) 5-325 MG tablet Take 1 tablet by mouth every 4 (four) hours as needed. 08/27/15   Madge Therrien Bunnie Pion, NP   BP 122/79 mmHg  Pulse 71  Temp(Src) 98.3 F (36.8 C) (Oral)  Resp 18  SpO2 98%  LMP 07/24/2015 Physical Exam  Constitutional: She is oriented to person, place, and time. She appears well-developed and well-nourished. No distress.  HENT:  Head: Normocephalic and atraumatic.  Eyes: EOM are normal.  Neck: Neck supple.  Cardiovascular: Normal rate.   Pulmonary/Chest: Effort normal.  Musculoskeletal:       Arms:      Right hand: She exhibits tenderness, laceration and swelling. Decreased range of motion: due to pain. Normal sensation noted. Normal strength noted. She exhibits no thumb/finger opposition.       Hands: Puncture wound to the palmar aspect of the right forearm with surrounding erythema. Tender on exam. Puncture wounds to the dorsum of the right hand and the base of the ring finger with surrounding erythema and ecchymosis. Tender on exam.   Neurological: She is alert and oriented to person, place, and time. No cranial nerve deficit.  Skin: Skin is warm and dry.  Psychiatric: She has a normal mood and affect. Her behavior is normal.  Nursing note and vitals reviewed.  Hand soaked on arrival in NSS with peroxide  ED Course  Irrigation and debridement Date/Time: 08/27/2015 9:54 PM Performed by: Ashley Murrain Authorized by: Ashley Murrain Consent: Verbal consent obtained. Risks and benefits: risks, benefits and alternatives were discussed Consent given by: patient Patient understanding: patient states understanding of the procedure being performed Imaging studies: imaging studies available Required items: required blood products, implants, devices, and special equipment available Patient identity  confirmed: verbally with patient Preparation: Patient was prepped and draped in the usual sterile fashion. Local anesthesia used: yes Local anesthetic: lidocaine 1% without epinephrine Anesthetic total: 5 ml Patient sedated: no Patient tolerance: Patient tolerated the procedure well with no immediate complications Comments: Puncture wounds from dog bite cleaned with betadine and irrigated with NSS, wet gauze wick placed in the wound of the right forearm and one to the the wound on the dorsum of the right hand. Dressing applied.     Dg Wrist Complete Right  08/27/2015  CLINICAL DATA:  Dog bites to the right arm.  Pain. EXAM: RIGHT WRIST - COMPLETE 3+ VIEW COMPARISON:  None. FINDINGS: There is no evidence of fracture or dislocation. There is no evidence of arthropathy or other focal bone abnormality. There is soft tissue swelling and soft tissue emphysema along the volar aspect of the right distal forearm.  IMPRESSION: No acute osseous injury of the right wrist. Soft tissue swelling and soft tissue emphysema along the volar aspect of the right distal forearm. Electronically Signed   By: Kathreen Devoid   On: 08/27/2015 20:40   Dg Hand Complete Right  08/27/2015  CLINICAL DATA:  Dog bites of the right arm. EXAM: RIGHT HAND - COMPLETE 3+ VIEW COMPARISON:  None. FINDINGS: There is no evidence of fracture or dislocation. There is no evidence of arthropathy or other focal bone abnormality. Soft tissues are unremarkable. IMPRESSION: No acute osseous injury of the right hand. Electronically Signed   By: Kathreen Devoid   On: 08/27/2015 20:46      Augmentin 875 mg PO, tylenol 1 gram PO  Consult with Dr. Amedeo Plenty, on call for hand, pictures of the injury sent to him. He request Unasyn 3 grams IV, and continue Augmentin, irrigation of wound and gauze wick. He will recheck the patient tomorrow at [facility] ED.   MDM  29 y.o. female with puncture wound to the right forearm wrist and hand due to dog bite. Stable  for d/c with Augmentin that she will take her next dose at 8 am and follow up with Dr. Annie Main in the [facility] ED tomorrow at 11:30 am.   Final diagnoses:  Dog bite, hand, right, initial encounter       [facility], NP 08/27/15 2341  Milton Ferguson, MD 08/28/15 [phone]

## 2015-08-27 NOTE — ED Notes (Signed)
Pt believes her tetanus shot is up to date at least within the last 5 years

## 2015-08-27 NOTE — ED Notes (Signed)
Pt reports dog bites to RT arm. Pt noted to have 3 puncture wounds to RUE. Bleeding controlled. Pt stated broke up a dog fight. States dogs are UTD on rabies vaccinations.

## 2015-08-27 NOTE — ED Notes (Signed)
Stage manager spoke with pt on telephone

## 2015-08-27 NOTE — ED Notes (Signed)
[facility] c-comm called for animal control to speak with pt or to call back

## 2015-08-27 NOTE — ED Notes (Signed)
Instructed pt to take all of antibiotics as prescribed.Pt verbalized understanding of no driving and to use caution within 4 hours of taking pain meds due to meds cause drowsiness 

## 2015-08-27 NOTE — Discharge Instructions (Signed)
Take your next Augmentin at 8 am. Take the hydrocodone as needed for pain. Do not drive if you are taking the pain medication as it will make you sleepy.  Dr. Amedeo Plenty will plan to see you tomorrow at 11:30 am in the [facility] ED. You will need to be there at 11:00 am to check in. Tell them that Dr. Amedeo Plenty is planning to see you there for recheck of your dog bite to the hand.

## 2015-08-28 ENCOUNTER — Emergency Department (HOSPITAL_COMMUNITY)
Admission: EM | Admit: 2015-08-28 | Discharge: 2015-08-28 | Disposition: A | Discharge status: Home / Self Care | Payer: BLUE CROSS/BLUE SHIELD | Attending: Emergency Medicine | Admitting: Emergency Medicine

## 2015-08-28 ENCOUNTER — Encounter (HOSPITAL_COMMUNITY): Payer: Self-pay | Admitting: Emergency Medicine

## 2015-08-28 DIAGNOSIS — Z8639 Personal history of other endocrine, nutritional and metabolic disease: Secondary | ICD-10-CM | POA: Diagnosis not present

## 2015-08-28 DIAGNOSIS — Z87891 Personal history of nicotine dependence: Secondary | ICD-10-CM | POA: Insufficient documentation

## 2015-08-28 DIAGNOSIS — J45909 Unspecified asthma, uncomplicated: Secondary | ICD-10-CM | POA: Diagnosis not present

## 2015-08-28 DIAGNOSIS — Z8659 Personal history of other mental and behavioral disorders: Secondary | ICD-10-CM | POA: Insufficient documentation

## 2015-08-28 DIAGNOSIS — I10 Essential (primary) hypertension: Secondary | ICD-10-CM | POA: Diagnosis not present

## 2015-08-28 DIAGNOSIS — Z79899 Other long term (current) drug therapy: Secondary | ICD-10-CM | POA: Diagnosis not present

## 2015-08-28 DIAGNOSIS — Z4801 Encounter for change or removal of surgical wound dressing: Secondary | ICD-10-CM | POA: Diagnosis present

## 2015-08-28 DIAGNOSIS — Z8679 Personal history of other diseases of the circulatory system: Secondary | ICD-10-CM | POA: Insufficient documentation

## 2015-08-28 DIAGNOSIS — Z5189 Encounter for other specified aftercare: Secondary | ICD-10-CM

## 2015-08-28 MED ORDER — AMOXICILLIN-POT CLAVULANATE 875-125 MG PO TABS: 1.0000 | ORAL_TABLET | Freq: Two times a day (BID) | ORAL | Status: DC

## 2015-08-28 MED ORDER — MORPHINE SULFATE (PF) 4 MG/ML IV SOLN
4.0000 mg | Freq: Once | INTRAVENOUS | Status: DC
  Filled 2015-08-28: qty 1

## 2015-08-28 MED ORDER — SODIUM CHLORIDE 0.9 % IV SOLN
3.0000 g | Freq: Once | INTRAVENOUS | Status: AC
  Administered 2015-08-28: 3 g via INTRAVENOUS
  Filled 2015-08-28: qty 3
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
## 2015-08-28 NOTE — Discharge Instructions (Signed)
Please follow with your primary care doctor in the next 2 days for a check-up. They must obtain records for further management.  ° °Do not hesitate to return to the Emergency Department for any new, worsening or concerning symptoms.  ° °

## 2015-08-28 NOTE — ED Notes (Signed)
Pt. Stated, I was bitten by a dog yesterday and seen Forestine Na and told to meet dr. Amedeo Plenty here today at 1130. Dog bit e on top of hand and forearm

## 2015-08-28 NOTE — ED Notes (Signed)
Declined W/C at D/C and was escorted to lobby by RN. 
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
## 2016-02-22 ENCOUNTER — Other Ambulatory Visit: Payer: Self-pay | Admitting: General Surgery

## 2016-02-22 DIAGNOSIS — R1011 Right upper quadrant pain: Secondary | ICD-10-CM

## 2016-02-23 ENCOUNTER — Ambulatory Visit
Admission: RE | Admit: 2016-02-23 | Discharge: 2016-02-23 | Disposition: A | Discharge status: Home / Self Care | Payer: BLUE CROSS/BLUE SHIELD | Source: Ambulatory Visit | Attending: General Surgery | Admitting: General Surgery

## 2016-02-23 DIAGNOSIS — R1011 Right upper quadrant pain: Secondary | ICD-10-CM

## 2016-03-14 ENCOUNTER — Ambulatory Visit: Payer: Self-pay | Admitting: General Surgery

## 2016-03-19 ENCOUNTER — Encounter (HOSPITAL_COMMUNITY)
Admission: RE | Admit: 2016-03-19 | Discharge: 2016-03-19 | Disposition: A | Discharge status: Home / Self Care | Payer: BLUE CROSS/BLUE SHIELD | Source: Ambulatory Visit | Attending: General Surgery | Admitting: General Surgery

## 2016-03-19 ENCOUNTER — Encounter (HOSPITAL_COMMUNITY): Payer: Self-pay

## 2016-03-19 DIAGNOSIS — K8020 Calculus of gallbladder without cholecystitis without obstruction: Secondary | ICD-10-CM | POA: Diagnosis not present

## 2016-03-19 DIAGNOSIS — Z9884 Bariatric surgery status: Secondary | ICD-10-CM | POA: Diagnosis not present

## 2016-03-19 DIAGNOSIS — F17200 Nicotine dependence, unspecified, uncomplicated: Secondary | ICD-10-CM | POA: Diagnosis not present

## 2016-03-19 DIAGNOSIS — E669 Obesity, unspecified: Secondary | ICD-10-CM | POA: Diagnosis not present

## 2016-03-19 DIAGNOSIS — Z7901 Long term (current) use of anticoagulants: Secondary | ICD-10-CM | POA: Diagnosis not present

## 2016-03-19 DIAGNOSIS — J45909 Unspecified asthma, uncomplicated: Secondary | ICD-10-CM | POA: Diagnosis not present

## 2016-03-19 DIAGNOSIS — Z6834 Body mass index (BMI) 34.0-34.9, adult: Secondary | ICD-10-CM | POA: Diagnosis not present

## 2016-03-19 HISTORY — DX: Other specified postprocedural states: Z98.890

## 2016-03-19 HISTORY — DX: Family history of other specified conditions: Z84.89

## 2016-03-19 HISTORY — DX: Personal history of other diseases of the respiratory system: Z87.09

## 2016-03-19 HISTORY — DX: Nausea with vomiting, unspecified: R11.2

## 2016-03-19 LAB — CBC WITH DIFFERENTIAL/PLATELET
Basophils Absolute: 0 10*3/uL (ref 0.0–0.1)
Basophils Relative: 0 %
Eosinophils Absolute: 0.2 10*3/uL (ref 0.0–0.7)
Eosinophils Relative: 3 %
HEMATOCRIT: 44.9 % (ref 36.0–46.0)
HEMOGLOBIN: 15 g/dL (ref 12.0–15.0)
LYMPHS ABS: 2.9 10*3/uL (ref 0.7–4.0)
LYMPHS PCT: 38 %
MCH: 29.4 pg (ref 26.0–34.0)
MCHC: 33.4 g/dL (ref 30.0–36.0)
MCV: 88 fL (ref 78.0–100.0)
MONOS PCT: 4 %
Monocytes Absolute: 0.3 10*3/uL (ref 0.1–1.0)
NEUTROS ABS: 4.2 10*3/uL (ref 1.7–7.7)
NEUTROS PCT: 55 %
Platelets: 165 10*3/uL (ref 150–400)
RBC: 5.1 MIL/uL (ref 3.87–5.11)
RDW: 13.8 % (ref 11.5–15.5)
WBC: 7.5 10*3/uL (ref 4.0–10.5)

## 2016-03-19 LAB — COMPREHENSIVE METABOLIC PANEL
ALK PHOS: 42 U/L (ref 38–126)
ALT: 13 U/L — ABNORMAL LOW (ref 14–54)
ANION GAP: 8 (ref 5–15)
AST: 16 U/L (ref 15–41)
Albumin: 3.9 g/dL (ref 3.5–5.0)
BILIRUBIN TOTAL: 0.6 mg/dL (ref 0.3–1.2)
BUN: 8 mg/dL (ref 6–20)
CALCIUM: 9.2 mg/dL (ref 8.9–10.3)
CO2: 21 mmol/L — ABNORMAL LOW (ref 22–32)
CREATININE: 0.64 mg/dL (ref 0.44–1.00)
Chloride: 107 mmol/L (ref 101–111)
GFR calc non Af Amer: >60 mL/min (ref >60)
GLUCOSE: 85 mg/dL (ref 65–99)
Potassium: 4.1 mmol/L (ref 3.5–5.1)
Sodium: 136 mmol/L (ref 135–145)
TOTAL PROTEIN: 6.2 g/dL — AB (ref 6.5–8.1)

## 2016-03-19 LAB — HCG, SERUM, QUALITATIVE: Preg, Serum: NEGATIVE

## 2016-03-19 MED ORDER — CHLORHEXIDINE GLUCONATE 4 % EX LIQD: 60.0000 mL | Freq: Once | CUTANEOUS | Status: DC

## 2016-03-19 NOTE — Progress Notes (Signed)
Cardiologist denies  Sees whoever at [place]  Echo denies  Stress test denies  Heart cath denies  EKG/CXR denies in past yr

## 2016-03-19 NOTE — Pre-Procedure Instructions (Signed)
XAVIERA DABISH  03/19/2016      [facility], [place], [address] [place] [address] Phone: [phone] Fax: [phone]    Your procedure is scheduled on Thurs, June 15 @ 7:30 AM  Report to [facility] Admitting at 5:30 AM  Call this number if you have problems the morning of surgery:  [phone]   Remember:  Do not eat food or drink liquids after midnight.  Take these medicines the morning of surgery with A SIP OF WATER Albuterol<Bring Your Inhaler With You>   Do not wear jewelry, make-up or nail polish.  Do not wear lotions, powders, or perfumes.    Do not shave 48 hours prior to surgery.    Do not bring valuables to the hospital.  [facility] is not responsible for any belongings or valuables.  Contacts, dentures or bridgework may not be worn into surgery.  Leave your suitcase in the car.  After surgery it may be brought to your room.  For patients admitted to the hospital, discharge time will be determined by your treatment team.  Patients discharged the day of surgery will not be allowed to drive home.    Special instructions: [facility] - Preparing for Surgery  Before surgery, you can play an important role.  Because skin is not sterile, your skin needs to be as free of germs as possible.  You can reduce the number of germs on you skin by washing with CHG (chlorahexidine gluconate) soap before surgery.  CHG is an antiseptic cleaner which kills germs and bonds with the skin to continue killing germs even after washing.  Please DO NOT use if you have an allergy to CHG or antibacterial soaps.  If your skin becomes reddened/irritated stop using the CHG and inform your nurse when you arrive at Short Stay.  Do not shave (including legs and underarms) for at least 48 hours prior to the first CHG shower.  You may shave your face.  Please follow these instructions carefully:   1.  Shower with CHG Soap  the night before surgery and the                                morning of Surgery.  2.  If you choose to wash your hair, wash your hair first as usual with your       normal shampoo.  3.  After you shampoo, rinse your hair and body thoroughly to remove the                      Shampoo.  4.  Use CHG as you would any other liquid soap.  You can apply chg directly       to the skin and wash gently with scrungie or a clean washcloth.  5.  Apply the CHG Soap to your body ONLY FROM THE NECK DOWN.        Do not use on open wounds or open sores.  Avoid contact with your eyes,       ears, mouth and genitals (private parts).  Wash genitals (private parts)       with your normal soap.  6.  Wash thoroughly, paying special attention to the area where your surgery        will be performed.  7.  Thoroughly rinse your body with warm  water from the neck down.  8.  DO NOT shower/wash with your normal soap after using and rinsing off       the CHG Soap.  9.  Pat yourself dry with a clean towel.            10.  Wear clean pajamas.            11.  Place clean sheets on your bed the night of your first shower and do not        sleep with pets.  Day of Surgery  Do not apply any lotions/deoderants the morning of surgery.  Please wear clean clothes to the hospital/surgery center.     Please read over the following fact sheets that you were given.

## 2016-03-21 MED ORDER — CEFOTETAN DISODIUM-DEXTROSE 2-2.08 GM-% IV SOLR
2.0000 g | INTRAVENOUS | Status: AC
  Administered 2016-03-22: 2 g via INTRAVENOUS
  Filled 2016-03-21: qty 50

## 2016-03-22 ENCOUNTER — Ambulatory Visit (HOSPITAL_COMMUNITY): Payer: BLUE CROSS/BLUE SHIELD | Admitting: Certified Registered Nurse Anesthetist

## 2016-03-22 ENCOUNTER — Ambulatory Visit (HOSPITAL_COMMUNITY)
Admission: RE | Admit: 2016-03-22 | Discharge: 2016-03-22 | Disposition: A | Discharge status: Home / Self Care | Payer: BLUE CROSS/BLUE SHIELD | Source: Ambulatory Visit | Attending: General Surgery | Admitting: General Surgery

## 2016-03-22 ENCOUNTER — Encounter (HOSPITAL_COMMUNITY)
Admission: RE | Disposition: A | Discharge status: Home / Self Care | Payer: Self-pay | Source: Ambulatory Visit | Attending: General Surgery

## 2016-03-22 ENCOUNTER — Encounter (HOSPITAL_COMMUNITY): Payer: Self-pay | Admitting: Certified Registered Nurse Anesthetist

## 2016-03-22 ENCOUNTER — Ambulatory Visit (HOSPITAL_COMMUNITY): Payer: BLUE CROSS/BLUE SHIELD

## 2016-03-22 DIAGNOSIS — E669 Obesity, unspecified: Secondary | ICD-10-CM | POA: Insufficient documentation

## 2016-03-22 DIAGNOSIS — Z6834 Body mass index (BMI) 34.0-34.9, adult: Secondary | ICD-10-CM | POA: Insufficient documentation

## 2016-03-22 DIAGNOSIS — Z9884 Bariatric surgery status: Secondary | ICD-10-CM | POA: Insufficient documentation

## 2016-03-22 DIAGNOSIS — K8020 Calculus of gallbladder without cholecystitis without obstruction: Secondary | ICD-10-CM | POA: Diagnosis not present

## 2016-03-22 DIAGNOSIS — R1011 Right upper quadrant pain: Secondary | ICD-10-CM

## 2016-03-22 DIAGNOSIS — Z7901 Long term (current) use of anticoagulants: Secondary | ICD-10-CM | POA: Insufficient documentation

## 2016-03-22 DIAGNOSIS — J45909 Unspecified asthma, uncomplicated: Secondary | ICD-10-CM | POA: Insufficient documentation

## 2016-03-22 DIAGNOSIS — F17200 Nicotine dependence, unspecified, uncomplicated: Secondary | ICD-10-CM | POA: Insufficient documentation

## 2016-03-22 HISTORY — PX: CHOLECYSTECTOMY: SHX55

## 2016-03-22 SURGERY — LAPAROSCOPIC CHOLECYSTECTOMY WITH INTRAOPERATIVE CHOLANGIOGRAM
Anesthesia: General | Site: Abdomen

## 2016-03-22 MED ORDER — LIDOCAINE HCL (CARDIAC) 20 MG/ML IV SOLN
INTRAVENOUS | Status: DC | PRN
  Administered 2016-03-22: 60 mg via INTRATRACHEAL

## 2016-03-22 MED ORDER — PROPOFOL 10 MG/ML IV BOLUS
INTRAVENOUS | Status: DC | PRN
  Administered 2016-03-22: 50 mg via INTRAVENOUS
  Administered 2016-03-22: 20 mg via INTRAVENOUS
  Administered 2016-03-22: 160 mg via INTRAVENOUS

## 2016-03-22 MED ORDER — PROPOFOL 500 MG/50ML IV EMUL
INTRAVENOUS | Status: DC | PRN
  Administered 2016-03-22: 25 ug/kg/min via INTRAVENOUS

## 2016-03-22 MED ORDER — SCOPOLAMINE 1 MG/3DAYS TD PT72
MEDICATED_PATCH | TRANSDERMAL | Status: DC | PRN
  Administered 2016-03-22: 1 via TRANSDERMAL

## 2016-03-22 MED ORDER — LIDOCAINE 2% (20 MG/ML) 5 ML SYRINGE
INTRAMUSCULAR | Status: AC
  Filled 2016-03-22: qty 5

## 2016-03-22 MED ORDER — ACETAMINOPHEN 325 MG PO TABS: 325.0000 mg | ORAL_TABLET | ORAL | Status: DC | PRN

## 2016-03-22 MED ORDER — FENTANYL CITRATE (PF) 250 MCG/5ML IJ SOLN
INTRAMUSCULAR | Status: AC
  Filled 2016-03-22: qty 5

## 2016-03-22 MED ORDER — GLYCOPYRROLATE 0.2 MG/ML IJ SOLN
INTRAMUSCULAR | Status: DC | PRN
  Administered 2016-03-22 (×2): 0.1 mg via INTRAVENOUS
  Administered 2016-03-22: 0.2 mg via INTRAVENOUS

## 2016-03-22 MED ORDER — FENTANYL CITRATE (PF) 100 MCG/2ML IJ SOLN
25.0000 ug | INTRAMUSCULAR | Status: DC | PRN
  Administered 2016-03-22: 50 ug via INTRAVENOUS

## 2016-03-22 MED ORDER — GLYCOPYRROLATE 0.2 MG/ML IV SOSY
PREFILLED_SYRINGE | INTRAVENOUS | Status: AC
  Filled 2016-03-22: qty 3

## 2016-03-22 MED ORDER — KETOROLAC TROMETHAMINE 30 MG/ML IJ SOLN
30.0000 mg | INTRAMUSCULAR | Status: AC
Start: 2016-03-22 — End: 2016-03-22
  Administered 2016-03-22: 30 mg via INTRAVENOUS
  Filled 2016-03-22: qty 1

## 2016-03-22 MED ORDER — FENTANYL CITRATE (PF) 250 MCG/5ML IJ SOLN
INTRAMUSCULAR | Status: DC | PRN
  Administered 2016-03-22: 25 ug via INTRAVENOUS
  Administered 2016-03-22: 50 ug via INTRAVENOUS
  Administered 2016-03-22: 25 ug via INTRAVENOUS
  Administered 2016-03-22: 150 ug via INTRAVENOUS

## 2016-03-22 MED ORDER — BUPIVACAINE-EPINEPHRINE 0.25% -1:200000 IJ SOLN
INTRAMUSCULAR | Status: DC | PRN
  Administered 2016-03-22: 30 mL

## 2016-03-22 MED ORDER — ROCURONIUM BROMIDE 100 MG/10ML IV SOLN
INTRAVENOUS | Status: DC | PRN
  Administered 2016-03-22: 50 mg via INTRAVENOUS

## 2016-03-22 MED ORDER — ROCURONIUM BROMIDE 50 MG/5ML IV SOLN
INTRAVENOUS | Status: AC
  Filled 2016-03-22: qty 1

## 2016-03-22 MED ORDER — SCOPOLAMINE 1 MG/3DAYS TD PT72
MEDICATED_PATCH | TRANSDERMAL | Status: AC
  Filled 2016-03-22: qty 1

## 2016-03-22 MED ORDER — PROPOFOL 1000 MG/100ML IV EMUL
INTRAVENOUS | Status: AC
  Filled 2016-03-22: qty 100

## 2016-03-22 MED ORDER — OXYCODONE HCL 5 MG/5ML PO SOLN: 5.0000 mg | Freq: Once | ORAL | Status: DC | PRN

## 2016-03-22 MED ORDER — 0.9 % SODIUM CHLORIDE (POUR BTL) OPTIME
TOPICAL | Status: DC | PRN
  Administered 2016-03-22: 1000 mL

## 2016-03-22 MED ORDER — LACTATED RINGERS IV SOLN
INTRAVENOUS | Status: DC | PRN
  Administered 2016-03-22 (×2): via INTRAVENOUS

## 2016-03-22 MED ORDER — IOPAMIDOL (ISOVUE-300) INJECTION 61%
INTRAVENOUS | Status: AC
  Filled 2016-03-22: qty 50

## 2016-03-22 MED ORDER — MIDAZOLAM HCL 2 MG/2ML IJ SOLN
INTRAMUSCULAR | Status: DC | PRN
  Administered 2016-03-22 (×2): 1 mg via INTRAVENOUS

## 2016-03-22 MED ORDER — OXYCODONE HCL 5 MG PO TABS
5.0000 mg | ORAL_TABLET | Freq: Once | ORAL | Status: DC | PRN
Start: 2016-03-22 — End: 2016-03-22

## 2016-03-22 MED ORDER — SODIUM CHLORIDE 0.9 % IR SOLN
Status: DC | PRN
  Administered 2016-03-22: 1000 mL

## 2016-03-22 MED ORDER — MIDAZOLAM HCL 2 MG/2ML IJ SOLN
INTRAMUSCULAR | Status: AC
  Filled 2016-03-22: qty 2

## 2016-03-22 MED ORDER — BUPIVACAINE-EPINEPHRINE (PF) 0.25% -1:200000 IJ SOLN
INTRAMUSCULAR | Status: AC
  Filled 2016-03-22: qty 30

## 2016-03-22 MED ORDER — FENTANYL CITRATE (PF) 100 MCG/2ML IJ SOLN
INTRAMUSCULAR | Status: AC
  Filled 2016-03-22: qty 2

## 2016-03-22 MED ORDER — OXYCODONE HCL 5 MG PO TABS
5.0000 mg | ORAL_TABLET | ORAL | Status: DC | PRN
Start: 2016-03-22 — End: 2016-06-06

## 2016-03-22 MED ORDER — SODIUM CHLORIDE 0.9 % IV SOLN
INTRAVENOUS | Status: DC | PRN
  Administered 2016-03-22: 6 mL

## 2016-03-22 MED ORDER — KETOROLAC TROMETHAMINE 30 MG/ML IJ SOLN
INTRAMUSCULAR | Status: AC
  Filled 2016-03-22: qty 1

## 2016-03-22 MED ORDER — ONDANSETRON HCL 4 MG/2ML IJ SOLN
INTRAMUSCULAR | Status: DC | PRN
  Administered 2016-03-22: 4 mg via INTRAVENOUS

## 2016-03-22 MED ORDER — SUGAMMADEX SODIUM 200 MG/2ML IV SOLN
INTRAVENOUS | Status: DC | PRN
  Administered 2016-03-22: 200 mg via INTRAVENOUS

## 2016-03-22 MED ORDER — ACETAMINOPHEN 160 MG/5ML PO SOLN: 325.0000 mg | ORAL | Status: DC | PRN

## 2016-03-22 MED ORDER — ARTIFICIAL TEARS OP OINT
TOPICAL_OINTMENT | OPHTHALMIC | Status: DC | PRN
  Administered 2016-03-22: 1 via OPHTHALMIC

## 2016-03-22 MED ORDER — ONDANSETRON HCL 4 MG/2ML IJ SOLN
INTRAMUSCULAR | Status: AC
  Filled 2016-03-22: qty 2

## 2016-03-22 MED ORDER — DEXAMETHASONE SODIUM PHOSPHATE 10 MG/ML IJ SOLN
INTRAMUSCULAR | Status: AC
  Filled 2016-03-22: qty 1

## 2016-03-22 MED ORDER — ARTIFICIAL TEARS OP OINT
TOPICAL_OINTMENT | OPHTHALMIC | Status: AC
  Filled 2016-03-22: qty 3.5

## 2016-03-22 MED ORDER — DEXAMETHASONE SODIUM PHOSPHATE 10 MG/ML IJ SOLN
INTRAMUSCULAR | Status: DC | PRN
  Administered 2016-03-22: 10 mg via INTRAVENOUS

## 2016-03-22 SURGICAL SUPPLY — 53 items
APL SKNCLS STERI-STRIP NONHPOA (GAUZE/BANDAGES/DRESSINGS) ×1
APPLIER CLIP 5 13 M/L LIGAMAX5 (MISCELLANEOUS) ×2
APR CLP MED LRG 5 ANG JAW (MISCELLANEOUS) ×1
BAG SPEC RTRVL 10 TROC 200 (ENDOMECHANICALS) ×1
BANDAGE ADH SHEER 1  50/CT (GAUZE/BANDAGES/DRESSINGS) ×6 IMPLANT
BENZOIN TINCTURE PRP APPL 2/3 (GAUZE/BANDAGES/DRESSINGS) ×2 IMPLANT
BLADE SURG ROTATE 9660 (MISCELLANEOUS) IMPLANT
CANISTER SUCTION 2500CC (MISCELLANEOUS) ×2 IMPLANT
CHLORAPREP W/TINT 26ML (MISCELLANEOUS) ×2 IMPLANT
CLIP APPLIE 5 13 M/L LIGAMAX5 (MISCELLANEOUS) ×1 IMPLANT
COVER MAYO STAND STRL (DRAPES) ×2 IMPLANT
COVER SURGICAL LIGHT HANDLE (MISCELLANEOUS) ×2 IMPLANT
DEVICE TROCAR PUNCTURE CLOSURE (ENDOMECHANICALS) ×1 IMPLANT
DRAPE C-ARM 42X72 X-RAY (DRAPES) ×2 IMPLANT
DRSG TEGADERM 4X4.75 (GAUZE/BANDAGES/DRESSINGS) ×2 IMPLANT
ELECT REM PT RETURN 9FT ADLT (ELECTROSURGICAL) ×2
ELECTRODE REM PT RTRN 9FT ADLT (ELECTROSURGICAL) ×1 IMPLANT
GAUZE SPONGE 2X2 8PLY STRL LF (GAUZE/BANDAGES/DRESSINGS) ×1 IMPLANT
GLOVE BIO SURGEON STRL SZ7 (GLOVE) ×1 IMPLANT
GLOVE BIOGEL M STRL SZ7.5 (GLOVE) ×2 IMPLANT
GLOVE BIOGEL PI IND STRL 7.0 (GLOVE) IMPLANT
GLOVE BIOGEL PI IND STRL 7.5 (GLOVE) IMPLANT
GLOVE BIOGEL PI IND STRL 8 (GLOVE) ×1 IMPLANT
GLOVE BIOGEL PI INDICATOR 7.0 (GLOVE) ×2
GLOVE BIOGEL PI INDICATOR 7.5 (GLOVE) ×1
GLOVE BIOGEL PI INDICATOR 8 (GLOVE) ×1
GLOVE SURG SS PI 6.5 STRL IVOR (GLOVE) ×1 IMPLANT
GLOVE SURG SS PI 7.5 STRL IVOR (GLOVE) ×1 IMPLANT
GOWN STRL REUS W/ TWL LRG LVL3 (GOWN DISPOSABLE) ×3 IMPLANT
GOWN STRL REUS W/ TWL XL LVL3 (GOWN DISPOSABLE) ×1 IMPLANT
GOWN STRL REUS W/TWL LRG LVL3 (GOWN DISPOSABLE) ×6
GOWN STRL REUS W/TWL XL LVL3 (GOWN DISPOSABLE) ×2
KIT BASIN OR (CUSTOM PROCEDURE TRAY) ×2 IMPLANT
KIT ROOM TURNOVER OR (KITS) ×2 IMPLANT
NS IRRIG 1000ML POUR BTL (IV SOLUTION) ×2 IMPLANT
PAD ARMBOARD 7.5X6 YLW CONV (MISCELLANEOUS) ×2 IMPLANT
POUCH RETRIEVAL ECOSAC 10 (ENDOMECHANICALS) ×1 IMPLANT
POUCH RETRIEVAL ECOSAC 10MM (ENDOMECHANICALS) ×1
SCISSORS LAP 5X35 DISP (ENDOMECHANICALS) ×2 IMPLANT
SET CHOLANGIOGRAPH 5 50 .035 (SET/KITS/TRAYS/PACK) ×2 IMPLANT
SET IRRIG TUBING LAPAROSCOPIC (IRRIGATION / IRRIGATOR) ×2 IMPLANT
SLEEVE ENDOPATH XCEL 5M (ENDOMECHANICALS) ×4 IMPLANT
SPECIMEN JAR SMALL (MISCELLANEOUS) ×2 IMPLANT
SPONGE GAUZE 2X2 STER 10/PKG (GAUZE/BANDAGES/DRESSINGS) ×1
STRIP CLOSURE SKIN 1/2X4 (GAUZE/BANDAGES/DRESSINGS) ×2 IMPLANT
SUT MNCRL AB 4-0 PS2 18 (SUTURE) ×2 IMPLANT
SUT VICRYL 0 UR6 27IN ABS (SUTURE) ×1 IMPLANT
TOWEL OR 17X24 6PK STRL BLUE (TOWEL DISPOSABLE) ×2 IMPLANT
TOWEL OR 17X26 10 PK STRL BLUE (TOWEL DISPOSABLE) ×2 IMPLANT
TRAY LAPAROSCOPIC MC (CUSTOM PROCEDURE TRAY) ×2 IMPLANT
TROCAR XCEL BLUNT TIP 100MML (ENDOMECHANICALS) ×2 IMPLANT
TROCAR XCEL NON-BLD 5MMX100MML (ENDOMECHANICALS) ×2 IMPLANT
TUBING INSUFFLATION (TUBING) ×2 IMPLANT

## 2016-03-22 NOTE — Anesthesia Preprocedure Evaluation (Signed)
Anesthesia Evaluation  Patient identified by MRN, date of birth, ID band Patient awake    Reviewed: Allergy & Precautions, NPO status , Patient's Chart, lab work & pertinent test results  History of Anesthesia Complications (+) PONV and history of anesthetic complications  Airway Mallampati: II  TM Distance: >3 FB Neck ROM: Full    Dental  (+) Teeth Intact   Pulmonary neg shortness of breath, asthma , neg sleep apnea, neg recent URI, Current Smoker,    breath sounds clear to auscultation       Cardiovascular negative cardio ROS   Rhythm:Regular     Neuro/Psych  Headaches, PSYCHIATRIC DISORDERS Depression    GI/Hepatic Neg liver ROS, gallstones   Endo/Other  Morbid obesity  Renal/GU negative Renal ROS     Musculoskeletal   Abdominal   Peds  Hematology negative hematology ROS (+)   Anesthesia Other Findings   Reproductive/Obstetrics                             Anesthesia Physical Anesthesia Plan  ASA: II  Anesthesia Plan: General   Post-op Pain Management:    Induction: Intravenous  Airway Management Planned: Oral ETT  Additional Equipment: None  Intra-op Plan:   Post-operative Plan: Extubation in OR  Informed Consent: I have reviewed the patients History and Physical, chart, labs and discussed the procedure including the risks, benefits and alternatives for the proposed anesthesia with the patient or authorized representative who has indicated his/her understanding and acceptance.   Dental advisory given  Plan Discussed with: CRNA and Surgeon  Anesthesia Plan Comments: (Decadron, zofran, propofol gtt, scop patch)        Anesthesia Quick Evaluation

## 2016-03-22 NOTE — Progress Notes (Signed)
Report give to taylor rn as caregiver

## 2016-03-22 NOTE — Discharge Instructions (Signed)
[facility] °LAPAROSCOPIC SURGERY: POST OP INSTRUCTIONS °Always review your discharge instruction sheet given to you by the facility where your surgery was performed. °IF YOU HAVE DISABILITY OR FAMILY LEAVE FORMS, YOU MUST BRING THEM TO THE OFFICE FOR PROCESSING.   °DO NOT GIVE THEM TO YOUR DOCTOR. ° °1. A prescription for pain medication may be given to you upon discharge.  Take your pain medication as prescribed, if needed.  If narcotic pain medicine is not needed, then you may take acetaminophen (Tylenol) &/ or ibuprofen (Advil) as needed. °2. Take your usually prescribed medications unless otherwise directed. °3. If you need a refill on your pain medication, please contact your pharmacy.  They will contact our office to request authorization. Prescriptions will not be filled after 5pm or on week-ends. °4. You should follow a light diet the first few days after arrival home, such as soup and crackers, etc.  Be sure to include lots of fluids daily. °5. Most patients will experience some swelling and bruising in the area of the incisions.  Ice packs will help.  Swelling and bruising can take several days to resolve.  °6. It is common to experience some constipation if taking pain medication after surgery.  Increasing fluid intake and taking a stool softener (such as Colace) will usually help or prevent this problem from occurring.  A mild laxative (Milk of Magnesia or Miralax) should be taken according to package instructions if there are no bowel movements after 48 hours. °7. Unless discharge instructions indicate otherwise, you may remove your bandages 48 hours after surgery, and you may shower at that time.  You  have steri-strips (small skin tapes) in place directly over the incision.  These strips should be left on the skin for 7-10 days.  °8. ACTIVITIES:  You may resume regular (light) daily activities beginning the next day--such as daily self-care, walking, climbing stairs--gradually  increasing activities as tolerated.  You may have sexual intercourse when it is comfortable.  Refrain from any heavy lifting or straining until approved by your doctor. °a. You may drive when you are no longer taking prescription pain medication, you can comfortably wear a seatbelt, and you can safely maneuver your car and apply brakes. °9. You should see your doctor in the office for a follow-up appointment approximately 2-3 weeks after your surgery.  Make sure that you call for this appointment within a day or two after you arrive home to insure a convenient appointment time. °10. OTHER INSTRUCTIONS:  °WHEN TO CALL YOUR DOCTOR: °1. Fever over 101.0 °2. Inability to urinate °3. Continued bleeding from incision. °4. Increased pain, redness, or drainage from the incision. °5. Increasing abdominal pain ° °The clinic staff is available to answer your questions during regular business hours.  Please don’t hesitate to call and ask to speak to one of the nurses for clinical concerns.  If you have a medical emergency, go to the nearest emergency room or call 911.  A surgeon from [facility] is always on call at the hospital. °[address], [address], [place], [place]  [address] ? [address] [address], [place], [place]   [address] °[phone] ? [phone] ? FAX [phone] °Web site: [email or web address] ° ° ° ° ° °

## 2016-03-22 NOTE — Interval H&P Note (Signed)
History and Physical Interval Note:  03/22/2016 7:24 AM  Allison [name]  has presented today for surgery, with the diagnosis of Right upper quadrant pain   The various methods of treatment have been discussed with the patient and family. After consideration of risks, benefits and other options for treatment, the patient has consented to  Procedure(s): LAPAROSCOPIC CHOLECYSTECTOMY WITH INTRAOPERATIVE CHOLANGIOGRAM (N/A) as a surgical intervention .  The patient's history has been reviewed, patient examined, no change in status, stable for surgery.  I have reviewed the patient's chart and labs.  Questions were answered to the patient's satisfaction.    [name]. [name], MD, [place], Bariatric, & Minimally Invasive Surgery [facility] Surgery, [place]   [facility] M

## 2016-03-22 NOTE — Anesthesia Postprocedure Evaluation (Addendum)
Anesthesia Post Note  Patient: Allison [name]  Procedure(s) Performed: Procedure(s) (LRB): LAPAROSCOPIC CHOLECYSTECTOMY WITH INTRAOPERATIVE CHOLANGIOGRAM (N/A)  Patient location during evaluation: PACU Anesthesia Type: General Level of consciousness: awake Pain management: pain level controlled Vital Signs Assessment: post-procedure vital signs reviewed and stable Respiratory status: spontaneous breathing Cardiovascular status: stable Postop Assessment: no signs of nausea or vomiting Anesthetic complications: no    Last Vitals:  Filed Vitals:   03/22/16 1025 03/22/16 1032  BP: 130/93 153/65  Pulse: 52 56  Temp:  36.6 C  Resp: 12 11    Last Pain:  Filed Vitals:   03/22/16 1042  PainSc: 3                  Suhas Estis

## 2016-03-22 NOTE — Op Note (Signed)
Allison [name]:[id number] 11/22/1985 03/22/2016  Laparoscopic Cholecystectomy with IOC Procedure Note  Indications: This patient presents with symptomatic gallbladder disease and will undergo laparoscopic cholecystectomy.  Pre-operative Diagnosis: symptomatic cholelithiasis  Post-operative Diagnosis: Same  Surgeon: [name]   Assistants: none  Anesthesia: General endotracheal anesthesia  Procedure Details  The patient was seen again in the Holding Room. The risks, benefits, complications, treatment options, and expected outcomes were discussed with the patient. The possibilities of reaction to medication, pulmonary aspiration, perforation of viscus, bleeding, recurrent infection, finding a normal gallbladder, the need for additional procedures, failure to diagnose a condition, the possible need to convert to an open procedure, and creating a complication requiring transfusion or operation were discussed with the patient. The likelihood of improving the patient's symptoms with return to their baseline status is good.  The patient and/or family concurred with the proposed plan, giving informed consent. The site of surgery properly noted. The patient was taken to Operating Room, identified as Allison [name] and the procedure verified as Laparoscopic Cholecystectomy with Intraoperative Cholangiogram. A Time Out was held and the above information confirmed. Antibiotic prophylaxis was administered.   Prior to the induction of general anesthesia, antibiotic prophylaxis was administered. General endotracheal anesthesia was then administered and tolerated well. After the induction, the abdomen was prepped with Chloraprep and draped in the sterile fashion. The patient was positioned in the supine position.  Local anesthetic agent was injected into the skin near the umbilicus and an incision made. We dissected down to the abdominal fascia with blunt dissection.  The fascia was incised vertically  and we entered the peritoneal cavity bluntly.  A pursestring suture of 0-Vicryl was placed around the fascial opening.  The Hasson cannula was inserted and secured with the stay suture.  Pneumoperitoneum was then created with CO2 and tolerated well without any adverse changes in the patient's vital signs. An 5-mm port was placed in the subxiphoid position.  Two 5-mm ports were placed in the right upper quadrant. All skin incisions were infiltrated with a local anesthetic agent before making the incision and placing the trocars.   We positioned the patient in reverse Trendelenburg, tilted slightly to the patient's left.  The gallbladder was identified, the fundus grasped and retracted cephalad. Adhesions were lysed bluntly and with the electrocautery where indicated, taking care not to injure any adjacent organs or viscus. The infundibulum was grasped and retracted laterally, exposing the peritoneum overlying the triangle of Calot. This was then divided and exposed in a blunt fashion. A critical view of the cystic duct and cystic artery was obtained.  The cystic duct was clearly identified and bluntly dissected circumferentially. The cystic duct was ligated with a clip distally.   An incision was made in the cystic duct and the [facility] cholangiogram catheter introduced. The catheter was secured using a clip. A cholangiogram was then obtained which showed good visualization of the distal and proximal biliary tree with no sign of filling defects or obstruction.  Contrast flowed easily into the duodenum. The catheter was then removed.   The cystic duct was then ligated with clips and divided. The cystic artery which had been identified & dissected free was ligated with clips and divided as well.   The gallbladder was dissected from the liver bed in retrograde fashion with the electrocautery. The gallbladder was removed and placed in an Ecco sac.  The gallbladder and Ecco sac were then removed through the umbilical  port site. The liver bed was irrigated  and inspected. Hemostasis was achieved with the electrocautery. Copious irrigation was utilized and was repeatedly aspirated until clear.  The pursestring suture was used to close the umbilical fascia.  I placed an additional interrupted 0 vicryl suture at the umbilical fascia with an endoclose.   We again inspected the right upper quadrant for hemostasis.  The umbilical closure was inspected and there was no air leak and nothing trapped within the closure. Pneumoperitoneum was released as we removed the trocars.  4-0 Monocryl was used to close the skin.   Benzoin, steri-strips, and clean dressings were applied. The patient was then extubated and brought to the recovery room in stable condition. Instrument, sponge, and needle counts were correct at closure and at the conclusion of the case.   Findings: +critical view; normal IOC, normal appearing sleeve gastrectomy  Estimated Blood Loss: Minimal         Drains: none         Specimens: Gallbladder           Complications: None; patient tolerated the procedure well.         Disposition: PACU - hemodynamically stable.         Condition: stable  Allison [name]. [name], MD, FACS General, Bariatric, & Minimally Invasive Surgery [facility] Surgery, [place]

## 2016-03-22 NOTE — H&P (Signed)
Allison [name] 02/22/2016 9:46 AM Location: [facility] Patient #: [id number] DOB: [birth year] Married / Language: English / Race: White Female   History of Present Illness Allison [name] M. [name] MD; 02/22/2016 10:30 AM) The patient is a 30 year old female presenting status-post bariatric surgery. She comes in for follow-up after going laparoscopic sleeve gastrectomy with hiatal hernia repair on August 15. She was found to have a small incidental umbilical hernia as well which we left alone. Her last visit was December. She has not been in since then. She called in the other day complaining of intermittent right upper quadrant pain so we made an appointment for her today. She states over the past month she has been having some intermittent right upper quadrant pain. It is underneath her rib cage. It radiates to her right shoulder. The initial episode lasted about 30 minutes. Another episode lasts about an hour. The most recent episode lasted about 4 hours and was much more intense and painful. She was almost at the point where she was going to go to the emergency room but then it eased off. When she has these episodes it is associated with nausea and bloating. She denies any acholic stools. She denies any NSAID use. Episodes are occurring more at night. She reports good fetal choices. Her preoperative weight was 328 pounds. Her initial visit weight was 328 pounds as well. I last saw her in December 2016 Her weight at that time was 250 pounds. She denies any vomiting, heartburn. She reports a good appetite. She denies any food intolerances. She reports alternating episodes between constipation and loose stool. She is getting in more than 60 ounces of liquids today. She is getting an more than 60 g of protein a day. She reports some mild hair thinning and loss. She admits that she is not taking her supplements as regularly as she should. She has not seen a nutritionist in a  while. She also asked about timing of trying to get pregnant in the future.  She finally got some of her blood work done yesterday prior to her appointment. Some of her labs are still pending. They were not done fasting. Her hemoglobin and hematocrit were slightly elevated at 15.9 and 47.2. Her comprehensive metabolic panel was within normal limits except for an elevated ALT level of 109. She did have some mild transaminase elevation in the high 40s preoperatively to just the fatty liver disease.  Review of systems-the 12 point review systems was performed. Systems negative except for what is mentioned in HPI.   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) with hiatal hernia repair; IV weight 328lbs; preop 328.2 lbs OBESITY (BMI 30-39.9) (E66.9) RIGHT UPPER QUADRANT PAIN (R10.11)  Other Problems Allison Curry, MD; 02/22/2016 10:30 AM) Asthma Depression Anxiety Disorder Hypercholesterolemia  Past Surgical History Allison Curry, MD; 02/22/2016 10:30 AM) Tonsillectomy Cesarean Section - 1  Diagnostic Studies History Allison Curry, MD; 02/22/2016 10:30 AM) Pap Smear 1-5 years ago Mammogram never Colonoscopy never  Allergies Shyrl Numbers, LPN; 075-GRM D34-534 AM) No Known Drug Allergies04/04/2015  Medication History Shyrl Numbers, LPN; 075-GRM D34-534 AM) Allison [name] Complete (60MG  Tablet Chewable, Oral) Active. Iron (325 (65 Fe)MG Tablet, Oral) Active. Calcium (500MG  Tablet, Oral) Active. Medications Reconciled  Social History Allison Curry, MD; 02/22/2016 10:30 AM) Caffeine use Carbonated beverages, Coffee.  No drug use Tobacco use Current every day smoker. Alcohol use Occasional alcohol use.  Family History Allison Curry, MD; 02/22/2016  10:30 AM) Colon Cancer Family Members In General. Alcohol Abuse Brother. Hypertension Father. Migraine Headache Mother, Sister. Cancer Family Members In General. Thyroid problems Brother, Mother.  Pregnancy / Birth History Allison Curry, MD; 02/22/2016 10:30 AM) Regular periods Para 1 Age at menarche 62 years. Maternal age 31-30 Gravida 1  Vitals Allison [name] F. Turpin LPN; 075-GRM D34-534 AM) 02/22/2016 9:46 AM Weight: 216.8 lb Height: 66in Height was reported by patient. Body Surface Area: 2.07 m Body Mass Index: 34.99 kg/m  Temp.: 98.75F(Oral)  Pulse: 54 (Regular)  Resp.: 18 (Unlabored)  P.OX: 98% (Room air) BP: 132/84 (Sitting, Left Arm, Standard)       Physical Exam Allison [name] M. [name] MD; 02/22/2016 10:21 AM) General Mental Status-Alert. General Appearance-Consistent with stated age. Hydration-Well hydrated. Voice-Normal. Note: Obese, redundant skin in midsection   Head and Neck Head-normocephalic, atraumatic with no lesions or palpable masses. Trachea-midline. Thyroid Gland Characteristics - normal size and consistency.  Eye Eyeball - Bilateral-Extraocular movements intact. Sclera/Conjunctiva - Bilateral-No scleral icterus.  Chest and Lung Exam Chest and lung exam reveals -quiet, even and easy respiratory effort with no use of accessory muscles and on auscultation, normal breath sounds, no adventitious sounds and normal vocal resonance(She does have some mild expiratory coarse breath sounds). Inspection Chest Wall - Normal. Back - normal.  Breast - Did not examine.  Cardiovascular Cardiovascular examination reveals -normal heart sounds, regular rate and rhythm with no murmurs and normal pedal pulses bilaterally.  Abdomen Inspection Inspection of the abdomen reveals - No Hernias. Skin - Scar - Note: well healed low transverse c/s scar. Palpation/Percussion Palpation and Percussion of the abdomen reveal - Soft,  Non Tender, No Rebound tenderness, No Rigidity (guarding) and No hepatosplenomegaly. Auscultation Auscultation of the abdomen reveals - Bowel sounds normal.  Peripheral Vascular Upper Extremity Palpation - Pulses bilaterally normal.  Neurologic Neurologic evaluation reveals -alert and oriented x 3 with no impairment of recent or remote memory. Mental Status-Normal.  Neuropsychiatric The patient's mood and affect are described as -normal. Judgment and Insight-insight is appropriate concerning matters relevant to self.  Musculoskeletal Normal Exam - Left-Upper Extremity Strength Normal and Lower Extremity Strength Normal. Normal Exam - Right-Upper Extremity Strength Normal and Lower Extremity Strength Normal.  Lymphatic Head & Neck  General Head & Neck Lymphatics: Bilateral - Description - Normal. Axillary - Did not examine. Femoral & Inguinal - Did not examine.    Assessment & Plan Allison [name] M. 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 There appeared to be no complications from surgery. We rediscussed proper eating techniques and behaviors. We discussed the importance of taking supplements -encouraged her to set a daily repeating calendar reminder. However with her rapid weight loss I believe she has developed symptomatic cholelithiasis. Initial blood work done yesterday revealed an ALT level of 109 otherwise her comprehensive metabolic panel was normal. Please see discussion below. I also reminded her she should follow with the nutritionist. We also discussed the fact that she should really try to avoid getting pregnant for at least 1 year after weight loss surgery preferably longer. We discussed the rationale for that. Current Plans Follow up with [place] in the office in 3 MONTHS.  Call us sooner as needed.  OBESITY (BMI  30-39.9) (E66.9) Impression:  I congratulated her on her weight loss. She has lost approximately 111.4 pounds since surgery. I encouraged her to continue working out on a regular basis RIGHT UPPER QUADRANT PAIN (R10.11) Impression: I believe the patient's symptoms are consistent with gallbladder disease. We will get a right upper quadrant ultrasound to confirm our suspicion. But we went ahead and discussed surgical intervention  We discussed gallbladder disease. The patient was given Neurosurgeon. We discussed non-operative and operative management. We discussed the signs & symptoms of acute cholecystitis  I discussed laparoscopic cholecystectomy with IOC in detail. The patient was given educational material as well as diagrams detailing the procedure. We discussed the risks and benefits of a laparoscopic cholecystectomy including, but not limited to bleeding, infection, injury to surrounding structures such as the intestine or liver, bile leak, retained gallstones, need to convert to an open procedure, prolonged diarrhea, blood clots such as DVT, common bile duct injury, anesthesia risks, and possible need for additional procedures. We discussed the typical post-operative recovery course. I explained that the likelihood of improvement of their symptoms is good. Current Plans Pt Education - EMW_bariatricFU Pt Education - Pamphlet Given - Laparoscopic Gallbladder Surgery: discussed with patient and provided information.  [name]. [name], MD, FACS General, Bariatric, & Minimally Invasive Surgery [facility] Surgery, [place]

## 2016-03-22 NOTE — Anesthesia Procedure Notes (Signed)
Procedure Name: Intubation Date/Time: 03/22/2016 7:42 AM Performed by: Collier Bullock Pre-anesthesia Checklist: Patient identified, Emergency Drugs available, Suction available and Patient being monitored Patient Re-evaluated:Patient Re-evaluated prior to inductionOxygen Delivery Method: Circle system utilized Preoxygenation: Pre-oxygenation with 100% oxygen Intubation Type: IV induction Laryngoscope Size: Mac and 3 Grade View: Grade I Tube type: Oral Tube size: 7.0 mm Number of attempts: 1 Airway Equipment and Method: Stylet Placement Confirmation: ETT inserted through vocal cords under direct vision,  positive ETCO2 and breath sounds checked- equal and bilateral Secured at: 22 cm Tube secured with: Tape Dental Injury: Teeth and Oropharynx as per pre-operative assessment

## 2016-03-22 NOTE — Transfer of Care (Signed)
Immediate Anesthesia Transfer of Care Note  Patient: Allison [name]  Procedure(s) Performed: Procedure(s): LAPAROSCOPIC CHOLECYSTECTOMY WITH INTRAOPERATIVE CHOLANGIOGRAM (N/A)  Patient Location: PACU  Anesthesia Type:General  Level of Consciousness: awake, alert , oriented and patient cooperative  Airway & Oxygen Therapy: Patient Spontanous Breathing and Patient connected to face mask oxygen  Post-op Assessment: Report given to RN, Post -op Vital signs reviewed and stable, Patient moving all extremities X 4 and Patient able to stick tongue midline  Post vital signs: Reviewed and stable  Last Vitals:  Filed Vitals:   03/22/16 0640 03/22/16 0853  BP: 139/65 120/75  Pulse: 50 76  Temp: 36.7 C 36.8 C  Resp: 20     Last Pain: There were no vitals filed for this visit.    Patients Stated Pain Goal: 5 ([id number])  Complications: No apparent anesthesia complications

## 2016-03-23 ENCOUNTER — Encounter (HOSPITAL_COMMUNITY): Payer: Self-pay | Admitting: General Surgery

## 2016-06-06 ENCOUNTER — Encounter: Payer: Self-pay | Admitting: Women's Health

## 2016-06-06 ENCOUNTER — Ambulatory Visit (INDEPENDENT_AMBULATORY_CARE_PROVIDER_SITE_OTHER): Payer: BLUE CROSS/BLUE SHIELD | Admitting: Women's Health

## 2016-06-06 VITALS — BP 100/60 | HR 78 | Ht 67.0 in | Wt 190.0 lb

## 2016-06-06 DIAGNOSIS — O99841 Bariatric surgery status complicating pregnancy, first trimester: Secondary | ICD-10-CM | POA: Diagnosis not present

## 2016-06-06 DIAGNOSIS — Z3201 Encounter for pregnancy test, result positive: Secondary | ICD-10-CM | POA: Diagnosis not present

## 2016-06-06 DIAGNOSIS — Z9884 Bariatric surgery status: Secondary | ICD-10-CM

## 2016-06-06 DIAGNOSIS — O3680X0 Pregnancy with inconclusive fetal viability, not applicable or unspecified: Secondary | ICD-10-CM

## 2016-06-06 DIAGNOSIS — Z3490 Encounter for supervision of normal pregnancy, unspecified, unspecified trimester: Secondary | ICD-10-CM | POA: Insufficient documentation

## 2016-06-06 LAB — POCT URINE PREGNANCY: Preg Test, Ur: POSITIVE — AB

## 2016-06-06 NOTE — Progress Notes (Signed)
   [facility] Visit  Patient name: Allison [name] MRN [id number]  Date of birth: 04-Mar-1986  CC & HPI:  Allison [name] is a 30 y.o. G63P1001 Caucasian female presenting today for report of +HPT. No n/v, diarrhea for past 2 days- no sick contacts or food poisoning that she is aware of. Does have h/o gastric sleeve surgery 05/23/15, has lost 140lbs. H/O pre-e then eclampsia last pregnancy, w/ PLTCS d/t fetal distress.  Patient's last menstrual period was 05/11/2016.  Pertinent History Reviewed:  Medical & Surgical Hx:   Past medical, surgical, family, and social history reviewed in electronic medical record Medications: Reviewed & Updated - see associated section Allergies: Reviewed in electronic medical record  Objective Findings:  Vitals: BP 100/60   Pulse 78   Ht 5\' 7"  (1.702 m)   Wt 190 lb (86.2 kg)   LMP 05/11/2016   BMI 29.76 kg/m  Body mass index is 29.76 kg/m.  Physical Examination: General appearance - alert, well appearing, and in no distress  Results for orders placed or performed in visit on 06/06/16 (from the past 24 hour(s))  POCT urine pregnancy   Collection Time: 06/06/16  1:53 PM  Result Value Ref Range   Preg Test, Ur Positive (A) Negative     Assessment & Plan:  A:   [redacted]w[redacted]d by LMP  H/O gastric sleeve surgery  H/O pre-e then eclampsia  H/O c/s   P:  Continue pnv  Contact bariatric surgeon to see if any additional vitamins/supplements needed during pregnancy, also check to make sure it is ok for her to begin baby asa at 12wks d/t h/o pre-e/eclampsia  Imodium if absolutely necessary for diarrhea (if dehydrated, etc)  Return in about 3 weeks (around 06/27/2016) for new ob u/s, then 7 weeks for new ob visit.  Tawnya Crook CNM, [facility] 06/06/2016 2:31 PM

## 2016-06-06 NOTE — Patient Instructions (Signed)

## 2016-06-13 ENCOUNTER — Telehealth: Payer: Self-pay | Admitting: *Deleted

## 2016-06-13 NOTE — Telephone Encounter (Signed)
Pt states she is [redacted] weeks pregnant and is having bad back pain, abdominal cramping and some brownish spotting.  Advised pt to push fluids, rest when possible, and use Tylenol for pain.  Monitor bleeding and if starts passing clots or has flow of blood call us back.  Pt verbalized understanding.

## 2016-06-27 ENCOUNTER — Ambulatory Visit (INDEPENDENT_AMBULATORY_CARE_PROVIDER_SITE_OTHER): Payer: BLUE CROSS/BLUE SHIELD

## 2016-06-27 DIAGNOSIS — O3680X0 Pregnancy with inconclusive fetal viability, not applicable or unspecified: Secondary | ICD-10-CM

## 2016-06-27 DIAGNOSIS — Z3A01 Less than 8 weeks gestation of pregnancy: Secondary | ICD-10-CM | POA: Diagnosis not present

## 2016-06-27 NOTE — Progress Notes (Signed)
[place] 7+4 wks,single IUP w/ys,positive fht 145 bpm,normal ov's bilat,crl 12.9 mm

## 2016-06-28 ENCOUNTER — Encounter (HOSPITAL_COMMUNITY): Payer: Self-pay | Admitting: *Deleted

## 2016-06-28 ENCOUNTER — Emergency Department (HOSPITAL_COMMUNITY)
Admission: EM | Admit: 2016-06-28 | Discharge: 2016-06-29 | Disposition: A | Discharge status: Home / Self Care | Payer: BLUE CROSS/BLUE SHIELD | Attending: Emergency Medicine | Admitting: Emergency Medicine

## 2016-06-28 DIAGNOSIS — R197 Diarrhea, unspecified: Secondary | ICD-10-CM | POA: Insufficient documentation

## 2016-06-28 DIAGNOSIS — O26891 Other specified pregnancy related conditions, first trimester: Secondary | ICD-10-CM | POA: Diagnosis not present

## 2016-06-28 DIAGNOSIS — Z79899 Other long term (current) drug therapy: Secondary | ICD-10-CM | POA: Insufficient documentation

## 2016-06-28 DIAGNOSIS — J45909 Unspecified asthma, uncomplicated: Secondary | ICD-10-CM | POA: Diagnosis not present

## 2016-06-28 DIAGNOSIS — Z3A08 8 weeks gestation of pregnancy: Secondary | ICD-10-CM | POA: Insufficient documentation

## 2016-06-28 DIAGNOSIS — R1011 Right upper quadrant pain: Secondary | ICD-10-CM | POA: Diagnosis not present

## 2016-06-28 DIAGNOSIS — R110 Nausea: Secondary | ICD-10-CM | POA: Insufficient documentation

## 2016-06-28 DIAGNOSIS — Z87891 Personal history of nicotine dependence: Secondary | ICD-10-CM | POA: Diagnosis not present

## 2016-06-28 LAB — CBC WITH DIFFERENTIAL/PLATELET
BASOS ABS: 0 10*3/uL (ref 0.0–0.1)
BASOS PCT: 0 %
EOS PCT: 1 %
Eosinophils Absolute: 0.1 10*3/uL (ref 0.0–0.7)
HEMATOCRIT: 42.1 % (ref 36.0–46.0)
Hemoglobin: 14.5 g/dL (ref 12.0–15.0)
LYMPHS PCT: 18 %
Lymphs Abs: 2 10*3/uL (ref 0.7–4.0)
MCH: 30.3 pg (ref 26.0–34.0)
MCHC: 34.4 g/dL (ref 30.0–36.0)
MCV: 87.9 fL (ref 78.0–100.0)
MONO ABS: 0.4 10*3/uL (ref 0.1–1.0)
Monocytes Relative: 4 %
Neutro Abs: 8.7 10*3/uL — ABNORMAL HIGH (ref 1.7–7.7)
Neutrophils Relative %: 77 %
Platelets: 135 10*3/uL — ABNORMAL LOW (ref 150–400)
RBC: 4.79 MIL/uL (ref 3.87–5.11)
RDW: 13.2 % (ref 11.5–15.5)
Smear Review: ADEQUATE
WBC: 11.2 10*3/uL — ABNORMAL HIGH (ref 4.0–10.5)

## 2016-06-28 LAB — COMPREHENSIVE METABOLIC PANEL
ALK PHOS: 40 U/L (ref 38–126)
ALT: 17 U/L (ref 14–54)
ANION GAP: 7 (ref 5–15)
AST: 20 U/L (ref 15–41)
Albumin: 3.9 g/dL (ref 3.5–5.0)
BILIRUBIN TOTAL: 0.7 mg/dL (ref 0.3–1.2)
BUN: 9 mg/dL (ref 6–20)
CALCIUM: 8.7 mg/dL — AB (ref 8.9–10.3)
CO2: 22 mmol/L (ref 22–32)
CREATININE: 0.52 mg/dL (ref 0.44–1.00)
Chloride: 108 mmol/L (ref 101–111)
GFR calc non Af Amer: >60 mL/min (ref >60)
Glucose, Bld: 86 mg/dL (ref 65–99)
Potassium: 3.9 mmol/L (ref 3.5–5.1)
Sodium: 137 mmol/L (ref 135–145)
TOTAL PROTEIN: 6.2 g/dL — AB (ref 6.5–8.1)

## 2016-06-28 LAB — URINALYSIS, ROUTINE W REFLEX MICROSCOPIC
BILIRUBIN URINE: NEGATIVE
Glucose, UA: NEGATIVE mg/dL
HGB URINE DIPSTICK: NEGATIVE
Leukocytes, UA: NEGATIVE
NITRITE: NEGATIVE
PH: 6 (ref 5.0–8.0)
Protein, ur: NEGATIVE mg/dL
Specific Gravity, Urine: >1.03 — ABNORMAL HIGH (ref 1.005–1.030)

## 2016-06-28 LAB — LIPASE, BLOOD: Lipase: 14 U/L (ref 11–51)

## 2016-06-28 MED ORDER — PROMETHAZINE HCL 25 MG/ML IJ SOLN
12.5000 mg | Freq: Once | INTRAMUSCULAR | Status: DC
  Filled 2016-06-28: qty 1

## 2016-06-28 MED ORDER — ACETAMINOPHEN 500 MG PO TABS
1000.0000 mg | ORAL_TABLET | Freq: Once | ORAL | Status: AC
  Administered 2016-06-28: 1000 mg via ORAL
  Filled 2016-06-28: qty 2

## 2016-06-28 MED ORDER — SODIUM CHLORIDE 0.9 % IV BOLUS (SEPSIS)
1000.0000 mL | Freq: Once | INTRAVENOUS | Status: AC
  Administered 2016-06-28: 1000 mL via INTRAVENOUS

## 2016-06-28 MED ORDER — MORPHINE SULFATE (PF) 2 MG/ML IV SOLN
2.0000 mg | Freq: Once | INTRAVENOUS | Status: DC
  Filled 2016-06-28: qty 1

## 2016-06-28 NOTE — ED Triage Notes (Signed)
Pt c/o sharp abd pain with cramping, nausea and diarrhea that started a few weeks ago, pt reports that she is [redacted] weeks pregnant,  [facility]27/2016 and results were "good"

## 2016-06-28 NOTE — ED Provider Notes (Signed)
[facility] DEPT Provider Note   CSN: [id number] Arrival date & time: 06/28/16  1950     History   Chief Complaint Chief Complaint  Patient presents with  . Abdominal Pain    HPI Allison [name] is a 30 y.o. female presenting with intermittent episodes of sharp, cramping upper abdominal pain nausea and episodes of diarrhea which has been present for the past several weeks and becoming more frequent.  These Episodes are triggered by eating meals, stating her pain usually starts within 30 minutes of the medial and remind her of her gallbladder pain prior to her cholecystectomy performed 3 months ago.  She also endorses having a looser than normal stool earlier today which was very dark, but not tarry in texture and is concerned for possible stomach ulcer as this is a frequent complication of gastric sleeve surgery which she underwent one year ago. She denies history of reflux.  She is currently [redacted] weeks pregnant as confirmed by an ultrasound completed at [facility] yesterday.  She denies pelvic pain or vaginal discharge or bleeding. She has had no treatment prior to arrival.     HPI  Past Medical History:  Diagnosis Date  . Asthma     Albuterol as needed  . Depression    has been on meds in past but after had child then everything is fine  . Family history of adverse reaction to anesthesia    grandma gets very sick  . History of bronchitis 2016  . Migraine   . PONV (postoperative nausea and vomiting)    gets very sick and very depressed    Patient Active Problem List   Diagnosis Date Noted  . Pregnant 06/06/2016  . Morbid obesity with BMI of 50.0-59.9, adult ([place]) 05/23/2015  . Elevated triglycerides with high cholesterol 05/23/2015  . Elevated transaminase level 05/23/2015  . 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 Pre-eclampsia 07/09/2012  . Status post primary low transverse cesarean section--fetal intolerance to labor, pre-eclampsia 07/03/2012  . PIH  (pregnancy induced hypertension) 05/23/2012  . Preterm labor 05/23/2012  . Drug abuse 01/24/2012  . Depression 07/09/2011  . Smoker 07/09/2011  . Migraine 07/09/2011  . Obesity 07/09/2011  . HLD (hyperlipidemia) 07/09/2011    Past Surgical History:  Procedure Laterality Date  . BREATH TEK H PYLORI N/A 02/04/2015   Procedure: BREATH TEK H PYLORI;  Surgeon: Greer Pickerel, MD;  Location: [facility];  Service: General;  Laterality: N/A;  . CESAREAN SECTION  07/03/2012   Procedure: CESAREAN SECTION;  Surgeon: Ena Dawley, MD;  Location: [facility];  Service: Obstetrics;  Laterality: N/A;  . CHOLECYSTECTOMY N/A 03/22/2016   Procedure: LAPAROSCOPIC CHOLECYSTECTOMY WITH INTRAOPERATIVE CHOLANGIOGRAM;  Surgeon: Greer Pickerel, MD;  Location: [facility];  Service: General;  Laterality: N/A;  . LAPAROSCOPIC GASTRIC SLEEVE RESECTION WITH HIATAL HERNIA REPAIR N/A 05/23/2015   Procedure: LAPAROSCOPIC GASTRIC SLEEVE RESECTION WITH  HIATAL HERNIA REPAIR AND UPPER ENDOSCOPY;  Surgeon: Greer Pickerel, MD;  Location: [facility];  Service: General;  Laterality: N/A;  . TONSILLECTOMY    . UPPER GI ENDOSCOPY  05/23/2015   Procedure: UPPER GI ENDOSCOPY;  Surgeon: Greer Pickerel, MD;  Location: [facility];  Service: General;;  . WISDOM TOOTH EXTRACTION  2007    OB History    Gravida Para Term Preterm AB Living   2 1 1  0 0 1   SAB TAB Ectopic Multiple Live Births   0 0 0 0 1       Home Medications  Prior to Admission medications   Medication Sig Start Date End Date Taking? Authorizing Provider  albuterol (PROVENTIL HFA;VENTOLIN HFA) 108 (90 BASE) MCG/ACT inhaler Inhale 2 puffs into the lungs every 6 (six) hours as needed for wheezing or shortness of breath.   Yes Historical Provider, MD  Prenatal Vit-Fe Fumarate-FA (PRENATAL MULTIVITAMIN) TABS tablet Take 1 tablet by mouth daily at 12 noon.   Yes Historical Provider, MD  HYDROcodone-acetaminophen (NORCO/VICODIN) 5-325 MG tablet Take 1 tablet by mouth every 4 (four) hours as  needed. 06/29/16   Evalee Jefferson, PA-C    Family History Family History  Problem Relation Age of Onset  . Hypertension Mother   . Depression Mother   . Hypothyroidism Mother   . Hypertension Father   . Depression Other   . Heart disease Paternal Aunt   . Cancer Maternal Grandfather     colon cancer  . Mental illness Sister   . Hypothyroidism Brother   . Alcohol abuse Brother   . Hypothyroidism Maternal Grandmother   . Hypertension Paternal Grandmother     Social History Social History  Substance Use Topics  . Smoking status: Former Smoker    Packs/day: 0.50    Years: 10.00    Types: Cigarettes  . Smokeless tobacco: Never Used  . Alcohol use No     Allergies   Review of patient's allergies indicates no known allergies.   Review of Systems Review of Systems  Constitutional: Negative for fever.  HENT: Negative for congestion and sore throat.   Eyes: Negative.   Respiratory: Negative for chest tightness and shortness of breath.   Cardiovascular: Negative for chest pain.  Gastrointestinal: Positive for abdominal pain, blood in stool, diarrhea and nausea. Negative for vomiting.  Genitourinary: Negative.  Negative for difficulty urinating, hematuria, pelvic pain, vaginal bleeding and vaginal discharge.  Musculoskeletal: Negative for arthralgias, joint swelling and neck pain.  Skin: Negative.  Negative for rash and wound.  Neurological: Negative for dizziness, weakness, light-headedness, numbness and headaches.  Psychiatric/Behavioral: Negative.      Physical Exam Updated Vital Signs BP 113/69 (BP Location: Left Arm)   Pulse (!) 58   Temp 98.3 F (36.8 C) (Oral)   Resp 18   Ht 5\' 7"  (1.702 m)   Wt 85.3 kg   LMP 05/11/2016   SpO2 100%   BMI 29.44 kg/m   Physical Exam  Constitutional: She appears well-developed and well-nourished.  HENT:  Head: Normocephalic and atraumatic.  Eyes: Conjunctivae are normal.  Neck: Normal range of motion.  Cardiovascular:  Normal rate, regular rhythm, normal heart sounds and intact distal pulses.   Pulmonary/Chest: Effort normal and breath sounds normal. She has no wheezes.  Abdominal: Soft. Bowel sounds are normal. There is tenderness in the right upper quadrant and epigastric area. There is no rigidity and no guarding.  Genitourinary: Rectal exam shows no external hemorrhoid, no internal hemorrhoid and guaiac negative stool.  Musculoskeletal: Normal range of motion.  Neurological: She is alert.  Skin: Skin is warm and dry.  Psychiatric: She has a normal mood and affect.  Nursing note and vitals reviewed.   Hemoccult negative   ED Treatments / Results  Labs (all labs ordered are listed, but only abnormal results are displayed) Labs Reviewed  CBC WITH DIFFERENTIAL/PLATELET - Abnormal; Notable for the following:       Result Value   WBC 11.2 (*)    Platelets 135 (*)    Neutro Abs 8.7 (*)    All other components within  normal limits  COMPREHENSIVE METABOLIC PANEL - Abnormal; Notable for the following:    Calcium 8.7 (*)    Total Protein 6.2 (*)    All other components within normal limits  URINALYSIS, ROUTINE W REFLEX MICROSCOPIC (NOT AT [facility]) - Abnormal; Notable for the following:    Specific Gravity, Urine >1.030 (*)    Ketones, ur TRACE (*)    All other components within normal limits  LIPASE, BLOOD  POC OCCULT BLOOD, ED    EKG  EKG Interpretation None       Radiology   Procedures Procedures (including critical care time)  Medications Ordered in ED Medications  morphine 2 MG/ML injection 2 mg (2 mg Intravenous Not Given 06/28/16 2153)  promethazine (PHENERGAN) injection 12.5 mg (12.5 mg Intravenous Not Given 06/28/16 2153)  sodium chloride 0.9 % bolus 1,000 mL (1,000 mLs Intravenous New Bag/Given 06/28/16 2141)  acetaminophen (TYLENOL) tablet 1,000 mg (1,000 mg Oral Given 06/28/16 2330)     Initial Impression / Assessment and Plan / ED Course  I have reviewed the triage vital  signs and the nursing notes.  Pertinent labs & imaging results that were available during my care of the patient were reviewed by me and considered in my medical decision making (see chart for details).  Clinical Course    Pt with post prandial ruq and epigastric abd pain.  Labs stable with no obvious source of pain.  Pt will need [place] to assess for possible dilated common duct/ retained gallstone. This has been scheduled for the am.  She was given a hydrocodone pre pack. She has no sx suggesting pregnancy complications, saw gyn clinic ytd with normal intrauterine pregnancy per [place].    She may need f/u with her surgeon at [facility] pending [place] results.  Final Clinical Impressions(s) / ED Diagnoses   Final diagnoses:  Right upper quadrant pain    New Prescriptions New Prescriptions   HYDROCODONE-ACETAMINOPHEN (NORCO/VICODIN) 5-325 MG TABLET    Take 1 tablet by mouth every 4 (four) hours as needed.     Evalee Jefferson, PA-C 06/29/16 0018    Margette Fast, MD 06/29/16 1344

## 2016-06-29 ENCOUNTER — Ambulatory Visit (HOSPITAL_COMMUNITY)
Admission: RE | Admit: 2016-06-29 | Discharge: 2016-06-29 | Disposition: A | Discharge status: Home / Self Care | Payer: BLUE CROSS/BLUE SHIELD | Source: Ambulatory Visit | Attending: Emergency Medicine | Admitting: Emergency Medicine

## 2016-06-29 DIAGNOSIS — R93421 Abnormal radiologic findings on diagnostic imaging of right kidney: Secondary | ICD-10-CM | POA: Insufficient documentation

## 2016-06-29 DIAGNOSIS — R1011 Right upper quadrant pain: Secondary | ICD-10-CM

## 2016-06-29 DIAGNOSIS — Z9049 Acquired absence of other specified parts of digestive tract: Secondary | ICD-10-CM | POA: Insufficient documentation

## 2016-06-29 MED ORDER — HYDROCODONE-ACETAMINOPHEN 5-325 MG PO TABS: 1.0000 | ORAL_TABLET | ORAL | 0 refills | Status: DC | PRN

## 2016-06-29 NOTE — Discharge Instructions (Signed)
You are being scheduled to return for an ultrasound in the morning to assess for a possible retained gallstone or other source of your abdominal pain.  In the interim, you can take the hydrocodone prescribed for pain - do not drive within 4 hours of taking this medicine.   You will be notified of your test result before you leave here in the morning.

## 2016-07-04 MED FILL — Hydrocodone-Acetaminophen Tab 5-325 MG: ORAL | Qty: 6 | Status: AC

## 2016-07-25 ENCOUNTER — Encounter: Payer: Self-pay | Admitting: Advanced Practice Midwife

## 2016-07-25 ENCOUNTER — Other Ambulatory Visit (HOSPITAL_COMMUNITY)
Admission: RE | Admit: 2016-07-25 | Discharge: 2016-07-25 | Disposition: A | Discharge status: Home / Self Care | Payer: BLUE CROSS/BLUE SHIELD | Source: Ambulatory Visit | Attending: Advanced Practice Midwife | Admitting: Advanced Practice Midwife

## 2016-07-25 ENCOUNTER — Ambulatory Visit (INDEPENDENT_AMBULATORY_CARE_PROVIDER_SITE_OTHER): Payer: BLUE CROSS/BLUE SHIELD | Admitting: Advanced Practice Midwife

## 2016-07-25 VITALS — BP 100/70 | HR 72 | Wt 189.0 lb

## 2016-07-25 DIAGNOSIS — Z3682 Encounter for antenatal screening for nuchal translucency: Secondary | ICD-10-CM

## 2016-07-25 DIAGNOSIS — Z331 Pregnant state, incidental: Secondary | ICD-10-CM

## 2016-07-25 DIAGNOSIS — Z1151 Encounter for screening for human papillomavirus (HPV): Secondary | ICD-10-CM | POA: Insufficient documentation

## 2016-07-25 DIAGNOSIS — O09299 Supervision of pregnancy with other poor reproductive or obstetric history, unspecified trimester: Secondary | ICD-10-CM

## 2016-07-25 DIAGNOSIS — Z124 Encounter for screening for malignant neoplasm of cervix: Secondary | ICD-10-CM

## 2016-07-25 DIAGNOSIS — Z01419 Encounter for gynecological examination (general) (routine) without abnormal findings: Secondary | ICD-10-CM | POA: Insufficient documentation

## 2016-07-25 DIAGNOSIS — O34219 Maternal care for unspecified type scar from previous cesarean delivery: Secondary | ICD-10-CM

## 2016-07-25 DIAGNOSIS — Z3481 Encounter for supervision of other normal pregnancy, first trimester: Secondary | ICD-10-CM

## 2016-07-25 DIAGNOSIS — Z1389 Encounter for screening for other disorder: Secondary | ICD-10-CM

## 2016-07-25 LAB — POCT URINALYSIS DIPSTICK
Blood, UA: NEGATIVE
Blood, UA: NEGATIVE
Glucose, UA: NEGATIVE
Glucose, UA: NEGATIVE
KETONES UA: NEGATIVE
Ketones, UA: NEGATIVE
LEUKOCYTES UA: NEGATIVE
LEUKOCYTES UA: NEGATIVE
NITRITE UA: NEGATIVE
Nitrite, UA: NEGATIVE

## 2016-07-25 NOTE — Progress Notes (Signed)
Subjective:    Allison [name] is a G2P1001 [redacted]w[redacted]d being seen today for her first obstetrical visit.  Her obstetrical history is significant for IOL for preeclampsia; Had gastric sleeve last year--dosn 140lb!.  Pregnancy history fully reviewed.  Patient reports some HA and occ dizziness.  Vitals:   07/25/16 1330  BP: 100/70  Pulse: 72  Weight: 189 lb (85.7 kg)    HISTORY: OB History  Gravida Para Term Preterm AB Living  2 1 1  0 0 1  SAB TAB Ectopic Multiple Live Births  0 0 0 0 1    # Outcome Date GA Lbr Len/2nd Weight Sex Delivery Anes PTL Lv  2 Current           1 Term 07/03/12 [redacted]w[redacted]d  5 lb 6.4 oz (2.45 kg) M CS-Vac EPI  LIV     Birth Comments: No problems at birth     Past Medical History:  Diagnosis Date  . Asthma     Albuterol as needed  . Depression    has been on meds in past but after had child then everything is fine  . Family history of adverse reaction to anesthesia    grandma gets very sick  . History of bronchitis 2016  . Migraine   . PONV (postoperative nausea and vomiting)    gets very sick and very depressed   Past Surgical History:  Procedure Laterality Date  . BREATH TEK H PYLORI N/A 02/04/2015   Procedure: BREATH TEK H PYLORI;  Surgeon: Greer Pickerel, MD;  Location: [facility];  Service: General;  Laterality: N/A;  . CESAREAN SECTION  07/03/2012   Procedure: CESAREAN SECTION;  Surgeon: Ena Dawley, MD;  Location: [facility];  Service: Obstetrics;  Laterality: N/A;  . CHOLECYSTECTOMY N/A 03/22/2016   Procedure: LAPAROSCOPIC CHOLECYSTECTOMY WITH INTRAOPERATIVE CHOLANGIOGRAM;  Surgeon: Greer Pickerel, MD;  Location: [facility];  Service: General;  Laterality: N/A;  . LAPAROSCOPIC GASTRIC SLEEVE RESECTION WITH HIATAL HERNIA REPAIR N/A 05/23/2015   Procedure: LAPAROSCOPIC GASTRIC SLEEVE RESECTION WITH  HIATAL HERNIA REPAIR AND UPPER ENDOSCOPY;  Surgeon: Greer Pickerel, MD;  Location: [facility];  Service: General;  Laterality: N/A;  . TONSILLECTOMY    . UPPER GI  ENDOSCOPY  05/23/2015   Procedure: UPPER GI ENDOSCOPY;  Surgeon: Greer Pickerel, MD;  Location: [facility];  Service: General;;  . WISDOM TOOTH EXTRACTION  2007   Family History  Problem Relation Age of Onset  . Hypertension Mother   . Depression Mother   . Hypothyroidism Mother   . Hypertension Father   . Depression Other   . Heart disease Paternal Aunt   . Cancer Maternal Grandfather     colon cancer  . Mental illness Sister   . Hypothyroidism Brother   . Alcohol abuse Brother   . Hypothyroidism Maternal Grandmother   . Hypertension Paternal Grandmother      Exam       Pelvic Exam:    Perineum: Normal Perineum   Vulva: normal   Vagina:  normal mucosa, normal discharge, no palpable nodules   Uterus Normal, Gravid, FH: 11     Cervix: normal   Adnexa: Not palpable   Urinary:  urethral meatus normal    System:     Skin: normal coloration and turgor, no rashes    Neurologic: oriented, normal, normal mood   Extremities: normal strength, tone, and muscle mass   HEENT PERRLA   Mouth/Teeth mucous membranes moist, normal dentition   Neck supple and no masses  Cardiovascular: regular rate and rhythm   Respiratory:  appears well, vitals normal, no respiratory distress, acyanotic   Abdomen: soft, non-tender;  FHR: 160 [place]          Assessment:    Pregnancy: G2P1001 Patient Active Problem List   Diagnosis Date Noted  . Hx of preeclampsia, prior pregnancy, currently pregnant 07/25/2016  . History of cesarean delivery, currently pregnant 07/25/2016  . Supervision of normal pregnancy 06/06/2016  . Morbid obesity with BMI of 50.0-59.9, adult ([place]) 05/23/2015  . Elevated triglycerides with high cholesterol 05/23/2015  . Elevated transaminase level 05/23/2015  . 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 Drug abuse 01/24/2012  . Depression 07/09/2011  . Smoker 07/09/2011  . Migraine 07/09/2011  . Obesity 07/09/2011  . HLD (hyperlipidemia) 07/09/2011        Plan:      Initial labs drawn. Continue prenatal vitamins  Problem list reviewed and updated  Reviewed n/v relief measures and warning s/s to report  Reviewed recommended weight gain based on pre-gravid BMI  Encouraged well-balanced diet Genetic Screening discussed Integrated Screen: requested.  Ultrasound discussed; fetal survey: requested.  Return 1 week for NT/IT only; 5 weeks for LROB.  CRESENZO-DISHMAN,Kailyn Vanderslice 07/25/2016

## 2016-07-25 NOTE — Patient Instructions (Signed)
 First Trimester of Pregnancy The first trimester of pregnancy is from week 1 until the end of week 12 (months 1 through 3). A week after a sperm fertilizes an egg, the egg will implant on the wall of the uterus. This embryo will begin to develop into a baby. Genes from you and your partner are forming the baby. The female genes determine whether the baby is a boy or a girl. At 6-8 weeks, the eyes and face are formed, and the heartbeat can be seen on ultrasound. At the end of 12 weeks, all the baby's organs are formed.  Now that you are pregnant, you will want to do everything you can to have a healthy baby. Two of the most important things are to get good prenatal care and to follow your health care provider's instructions. Prenatal care is all the medical care you receive before the baby's birth. This care will help prevent, find, and treat any problems during the pregnancy and childbirth. BODY CHANGES Your body goes through many changes during pregnancy. The changes vary from woman to woman.   You may gain or lose a couple of pounds at first.  You may feel sick to your stomach (nauseous) and throw up (vomit). If the vomiting is uncontrollable, call your health care provider.  You may tire easily.  You may develop headaches that can be relieved by medicines approved by your health care provider.  You may urinate more often. Painful urination may mean you have a bladder infection.  You may develop heartburn as a result of your pregnancy.  You may develop constipation because certain hormones are causing the muscles that push waste through your intestines to slow down.  You may develop hemorrhoids or swollen, bulging veins (varicose veins).  Your breasts may begin to grow larger and become tender. Your nipples may stick out more, and the tissue that surrounds them (areola) may become darker.  Your gums may bleed and may be sensitive to brushing and flossing.  Dark spots or blotches  (chloasma, mask of pregnancy) may develop on your face. This will likely fade after the baby is born.  Your menstrual periods will stop.  You may have a loss of appetite.  You may develop cravings for certain kinds of food.  You may have changes in your emotions from day to day, such as being excited to be pregnant or being concerned that something may go wrong with the pregnancy and baby.  You may have more vivid and strange dreams.  You may have changes in your hair. These can include thickening of your hair, rapid growth, and changes in texture. Some women also have hair loss during or after pregnancy, or hair that feels dry or thin. Your hair will most likely return to normal after your baby is born. WHAT TO EXPECT AT YOUR PRENATAL VISITS During a routine prenatal visit:  You will be weighed to make sure you and the baby are growing normally.  Your blood pressure will be taken.  Your abdomen will be measured to track your baby's growth.  The fetal heartbeat will be listened to starting around week 10 or 12 of your pregnancy.  Test results from any previous visits will be discussed. Your health care provider may ask you:  How you are feeling.  If you are feeling the baby move.  If you have had any abnormal symptoms, such as leaking fluid, bleeding, severe headaches, or abdominal cramping.  If you have any questions. Other   tests that may be performed during your first trimester include:  Blood tests to find your blood type and to check for the presence of any previous infections. They will also be used to check for low iron levels (anemia) and Rh antibodies. Later in the pregnancy, blood tests for diabetes will be done along with other tests if problems develop.  Urine tests to check for infections, diabetes, or protein in the urine.  An ultrasound to confirm the proper growth and development of the baby.  An amniocentesis to check for possible genetic problems.  Fetal  screens for spina bifida and Down syndrome.  You may need other tests to make sure you and the baby are doing well. HOME CARE INSTRUCTIONS  Medicines  Follow your health care provider's instructions regarding medicine use. Specific medicines may be either safe or unsafe to take during pregnancy.  Take your prenatal vitamins as directed.  If you develop constipation, try taking a stool softener if your health care provider approves. Diet  Eat regular, well-balanced meals. Choose a variety of foods, such as meat or vegetable-based protein, fish, milk and low-fat dairy products, vegetables, fruits, and whole grain breads and cereals. Your health care provider will help you determine the amount of weight gain that is right for you.  Avoid raw meat and uncooked cheese. These carry germs that can cause birth defects in the baby.  Eating four or five small meals rather than three large meals a day may help relieve nausea and vomiting. If you start to feel nauseous, eating a few soda crackers can be helpful. Drinking liquids between meals instead of during meals also seems to help nausea and vomiting.  If you develop constipation, eat more high-fiber foods, such as fresh vegetables or fruit and whole grains. Drink enough fluids to keep your urine clear or pale yellow. Activity and Exercise  Exercise only as directed by your health care provider. Exercising will help you:  Control your weight.  Stay in shape.  Be prepared for labor and delivery.  Experiencing pain or cramping in the lower abdomen or low back is a good sign that you should stop exercising. Check with your health care provider before continuing normal exercises.  Try to avoid standing for long periods of time. Move your legs often if you must stand in one place for a long time.  Avoid heavy lifting.  Wear low-heeled shoes, and practice good posture.  You may continue to have sex unless your health care provider directs you  otherwise. Relief of Pain or Discomfort  Wear a good support bra for breast tenderness.   Take warm sitz baths to soothe any pain or discomfort caused by hemorrhoids. Use hemorrhoid cream if your health care provider approves.   Rest with your legs elevated if you have leg cramps or low back pain.  If you develop varicose veins in your legs, wear support hose. Elevate your feet for 15 minutes, 3-4 times a day. Limit salt in your diet. Prenatal Care  Schedule your prenatal visits by the twelfth week of pregnancy. They are usually scheduled monthly at first, then more often in the last 2 months before delivery.  Write down your questions. Take them to your prenatal visits.  Keep all your prenatal visits as directed by your health care provider. Safety  Wear your seat belt at all times when driving.  Make a list of emergency phone numbers, including numbers for family, friends, the hospital, and police and fire departments. General   Tips  Ask your health care provider for a referral to a local prenatal education class. Begin classes no later than at the beginning of month 6 of your pregnancy.  Ask for help if you have counseling or nutritional needs during pregnancy. Your health care provider can offer advice or refer you to specialists for help with various needs.  Do not use hot tubs, steam rooms, or saunas.  Do not douche or use tampons or scented sanitary pads.  Do not cross your legs for long periods of time.  Avoid cat litter boxes and soil used by cats. These carry germs that can cause birth defects in the baby and possibly loss of the fetus by miscarriage or stillbirth.  Avoid all smoking, herbs, alcohol, and medicines not prescribed by your health care provider. Chemicals in these affect the formation and growth of the baby.  Schedule a dentist appointment. At home, brush your teeth with a soft toothbrush and be gentle when you floss. SEEK MEDICAL CARE IF:   You have  dizziness.  You have mild pelvic cramps, pelvic pressure, or nagging pain in the abdominal area.  You have persistent nausea, vomiting, or diarrhea.  You have a bad smelling vaginal discharge.  You have pain with urination.  You notice increased swelling in your face, hands, legs, or ankles. SEEK IMMEDIATE MEDICAL CARE IF:   You have a fever.  You are leaking fluid from your vagina.  You have spotting or bleeding from your vagina.  You have severe abdominal cramping or pain.  You have rapid weight gain or loss.  You vomit blood or material that looks like coffee grounds.  You are exposed to German measles and have never had them.  You are exposed to fifth disease or chickenpox.  You develop a severe headache.  You have shortness of breath.  You have any kind of trauma, such as from a fall or a car accident. Document Released: 09/18/2001 Document Revised: 02/08/2014 Document Reviewed: 08/04/2013 Exit[employer] Patient Information 2015 [employer]. This information is not intended to replace advice given to you by your health care provider. Make sure you discuss any questions you have with your health care provider.   Nausea & Vomiting  Have saltine crackers or pretzels by your bed and eat a few bites before you raise your head out of bed in the morning  Eat small frequent meals throughout the day instead of large meals  Drink plenty of fluids throughout the day to stay hydrated, just don't drink a lot of fluids with your meals.  This can make your stomach fill up faster making you feel sick  Do not brush your teeth right after you eat  Products with real ginger are good for nausea, like ginger ale and ginger hard candy Make sure it says made with real ginger!  Sucking on sour candy like lemon heads is also good for nausea  If your prenatal vitamins make you nauseated, take them at night so you will sleep through the nausea  Sea Bands  If you feel like you need  medicine for the nausea & vomiting please let us know  If you are unable to keep any fluids or food down please let us know   Constipation  Drink plenty of fluid, preferably water, throughout the day  Eat foods high in fiber such as fruits, vegetables, and grains  Exercise, such as walking, is a good way to keep your bowels regular  Drink warm fluids, especially warm   prune juice, or decaf coffee  Eat a 1/2 cup of real oatmeal (not instant), 1/2 cup applesauce, and 1/2-1 cup warm prune juice every day  If needed, you may take Colace (docusate sodium) stool softener once or twice a day to help keep the stool soft. If you are pregnant, wait until you are out of your first trimester (12-14 weeks of pregnancy)  If you still are having problems with constipation, you may take Miralax once daily as needed to help keep your bowels regular.  If you are pregnant, wait until you are out of your first trimester (12-14 weeks of pregnancy)  Safe Medications in Pregnancy   Acne: Benzoyl Peroxide Salicylic Acid  Backache/Headache: Tylenol: 2 regular strength every 4 hours OR              2 Extra strength every 6 hours  Colds/Coughs/Allergies: Benadryl (alcohol free) 25 mg every 6 hours as needed Breath right strips Claritin Cepacol throat lozenges Chloraseptic throat spray Cold-Eeze- up to three times per day Cough drops, alcohol free Flonase (by prescription only) Guaifenesin Mucinex Robitussin DM (plain only, alcohol free) Saline nasal spray/drops Sudafed (pseudoephedrine) & Actifed ** use only after [redacted] weeks gestation and if you do not have high blood pressure Tylenol Vicks Vaporub Zinc lozenges Zyrtec   Constipation: Colace Ducolax suppositories Fleet enema Glycerin suppositories Metamucil Milk of magnesia Miralax Senokot Smooth move tea  Diarrhea: Kaopectate Imodium A-D  *NO pepto Bismol  Hemorrhoids: Anusol Anusol HC Preparation  H Tucks  Indigestion: Tums Maalox Mylanta Zantac  Pepcid  Insomnia: Benadryl (alcohol free) 25mg every 6 hours as needed Tylenol PM Unisom, no Gelcaps  Leg Cramps: Tums MagGel  Nausea/Vomiting:  Bonine Dramamine Emetrol Ginger extract Sea bands Meclizine  Nausea medication to take during pregnancy:  Unisom (doxylamine succinate 25 mg tablets) Take one tablet daily at bedtime. If symptoms are not adequately controlled, the dose can be increased to a maximum recommended dose of two tablets daily (1/2 tablet in the morning, 1/2 tablet mid-afternoon and one at bedtime). Vitamin B6 100mg tablets. Take one tablet twice a day (up to 200 mg per day).  Skin Rashes: Aveeno products Benadryl cream or 25mg every 6 hours as needed Calamine Lotion 1% cortisone cream  Yeast infection: Gyne-lotrimin 7 Monistat 7   **If taking multiple medications, please check labels to avoid duplicating the same active ingredients **take medication as directed on the label ** Do not exceed 4000 mg of tylenol in 24 hours **Do not take medications that contain aspirin or ibuprofen      

## 2016-07-27 LAB — URINE CULTURE

## 2016-07-30 LAB — CYTOLOGY - PAP
Diagnosis: NEGATIVE
HPV: NOT DETECTED

## 2016-07-31 ENCOUNTER — Ambulatory Visit (HOSPITAL_COMMUNITY)
Admission: EM | Admit: 2016-07-31 | Discharge: 2016-07-31 | Disposition: A | Discharge status: Home / Self Care | Payer: BLUE CROSS/BLUE SHIELD | Attending: Family Medicine | Admitting: Family Medicine

## 2016-07-31 ENCOUNTER — Encounter (HOSPITAL_COMMUNITY): Payer: Self-pay | Admitting: Family Medicine

## 2016-07-31 DIAGNOSIS — R05 Cough: Secondary | ICD-10-CM

## 2016-07-31 DIAGNOSIS — J069 Acute upper respiratory infection, unspecified: Secondary | ICD-10-CM

## 2016-07-31 DIAGNOSIS — R059 Cough, unspecified: Secondary | ICD-10-CM

## 2016-07-31 MED ORDER — IPRATROPIUM BROMIDE 0.06 % NA SOLN: 2.0000 | Freq: Four times a day (QID) | NASAL | 1 refills | Status: DC

## 2016-07-31 NOTE — ED Provider Notes (Signed)
[place]    CSN: [id number] Arrival date & time: 07/31/16  1402     History   Chief Complaint Chief Complaint  Patient presents with  . Cough    HPI Allison [name] is a 30 y.o. female.   The history is provided by the patient.  Cough  Cough characteristics:  Non-productive, harsh and dry Severity:  Mild Onset quality:  Gradual Duration:  3 days Progression:  Unchanged Chronicity:  New Smoker: no   Context: sick contacts   Context comment:  Son is sick,pt 12wks preg, no smoking. Associated symptoms: rhinorrhea   Associated symptoms: no fever and no wheezing     Past Medical History:  Diagnosis Date  . Asthma     Albuterol as needed  . Depression    has been on meds in past but after had child then everything is fine  . Family history of adverse reaction to anesthesia    grandma gets very sick  . History of bronchitis 2016  . Migraine   . PONV (postoperative nausea and vomiting)    gets very sick and very depressed    Patient Active Problem List   Diagnosis Date Noted  . Hx of preeclampsia, prior pregnancy, currently pregnant 07/25/2016  . History of cesarean delivery, currently pregnant 07/25/2016  . Supervision of normal pregnancy 06/06/2016  . Morbid obesity with BMI of 50.0-59.9, adult ([place]) 05/23/2015  . Elevated triglycerides with high cholesterol 05/23/2015  . Elevated transaminase level 05/23/2015  . 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 Drug abuse 01/24/2012  . Depression 07/09/2011  . Smoker 07/09/2011  . Migraine 07/09/2011  . Obesity 07/09/2011  . HLD (hyperlipidemia) 07/09/2011    Past Surgical History:  Procedure Laterality Date  . BREATH TEK H PYLORI N/A 02/04/2015   Procedure: BREATH TEK H PYLORI;  Surgeon: Greer Pickerel, MD;  Location: [facility];  Service: General;  Laterality: N/A;  . CESAREAN SECTION  07/03/2012   Procedure: CESAREAN SECTION;  Surgeon: Ena Dawley, MD;  Location: [facility];  Service:  Obstetrics;  Laterality: N/A;  . CHOLECYSTECTOMY N/A 03/22/2016   Procedure: LAPAROSCOPIC CHOLECYSTECTOMY WITH INTRAOPERATIVE CHOLANGIOGRAM;  Surgeon: Greer Pickerel, MD;  Location: [facility];  Service: General;  Laterality: N/A;  . LAPAROSCOPIC GASTRIC SLEEVE RESECTION WITH HIATAL HERNIA REPAIR N/A 05/23/2015   Procedure: LAPAROSCOPIC GASTRIC SLEEVE RESECTION WITH  HIATAL HERNIA REPAIR AND UPPER ENDOSCOPY;  Surgeon: Greer Pickerel, MD;  Location: [facility];  Service: General;  Laterality: N/A;  . TONSILLECTOMY    . UPPER GI ENDOSCOPY  05/23/2015   Procedure: UPPER GI ENDOSCOPY;  Surgeon: Greer Pickerel, MD;  Location: [facility];  Service: General;;  . WISDOM TOOTH EXTRACTION  2007    OB History    Gravida Para Term Preterm AB Living   2 1 1  0 0 1   SAB TAB Ectopic Multiple Live Births   0 0 0 0 1       Home Medications    Prior to Admission medications   Medication Sig Start Date End Date Taking? Authorizing Provider  albuterol (PROVENTIL HFA;VENTOLIN HFA) 108 (90 BASE) MCG/ACT inhaler Inhale 2 puffs into the lungs every 6 (six) hours as needed for wheezing or shortness of breath.    Historical Provider, MD  Pediatric Multivit-Minerals-C (COMPLETE MULTI-VITAMIN PO) Take by mouth.    Historical Provider, MD  Prenatal Vit-Fe Fumarate-FA (PRENATAL MULTIVITAMIN) TABS tablet Take 1 tablet by mouth daily at 12 noon.    Historical Provider, MD  Family History Family History  Problem Relation Age of Onset  . Hypertension Mother   . Depression Mother   . Hypothyroidism Mother   . Hypertension Father   . Depression Other   . Heart disease Paternal Aunt   . Cancer Maternal Grandfather     colon cancer  . Mental illness Sister   . Hypothyroidism Brother   . Alcohol abuse Brother   . Hypothyroidism Maternal Grandmother   . Hypertension Paternal Grandmother     Social History Social History  Substance Use Topics  . Smoking status: Former Smoker    Packs/day: 0.50    Years: 10.00    Types:  Cigarettes  . Smokeless tobacco: Never Used  . Alcohol use No     Allergies   Review of patient's allergies indicates no known allergies.   Review of Systems Review of Systems  Constitutional: Negative.  Negative for fever.  HENT: Positive for congestion, postnasal drip and rhinorrhea.   Respiratory: Positive for cough. Negative for wheezing.   Cardiovascular: Negative.   All other systems reviewed and are negative.    Physical Exam Triage Vital Signs ED Triage Vitals [07/31/16 1419]  Enc Vitals Group     BP 119/84     Pulse Rate 95     Resp 16     Temp 98.3 F (36.8 C)     Temp Source Oral     SpO2 97 %     Weight      Height      Head Circumference      Peak Flow      Pain Score      Pain Loc      Pain Edu?      Excl. in [place]?    No data found.   Updated Vital Signs BP 119/84 (BP Location: Left Arm)   Pulse 95   Temp 98.3 F (36.8 C) (Oral)   Resp 16   LMP 05/11/2016   SpO2 97%   Visual Acuity Right Eye Distance:   Left Eye Distance:   Bilateral Distance:    Right Eye Near:   Left Eye Near:    Bilateral Near:     Physical Exam  Constitutional: She appears well-developed and well-nourished. No distress.  HENT:  Right Ear: External ear normal.  Left Ear: External ear normal.  Nose: Mucosal edema and rhinorrhea present.  Mouth/Throat: Oropharynx is clear and moist.  Neck: Normal range of motion. Neck supple.  Cardiovascular: Normal rate, regular rhythm and normal heart sounds.   Pulmonary/Chest: Effort normal and breath sounds normal.  Lymphadenopathy:    She has no cervical adenopathy.  Skin: Skin is warm and dry.  Nursing note and vitals reviewed.    UC Treatments / Results  Labs (all labs ordered are listed, but only abnormal results are displayed) Labs Reviewed - No data to display  EKG  EKG Interpretation None       Radiology No results found.  Procedures Procedures (including critical care time)  Medications Ordered in  UC Medications - No data to display   Initial Impression / Assessment and Plan / UC Course  I have reviewed the triage vital signs and the nursing notes.  Pertinent labs & imaging results that were available during my care of the patient were reviewed by me and considered in my medical decision making (see chart for details).  Clinical Course     Final Clinical Impressions(s) / UC Diagnoses   Final diagnoses:  None  New Prescriptions New Prescriptions   No medications on file     Billy Fischer, MD 07/31/16 1435

## 2016-07-31 NOTE — Discharge Instructions (Signed)
Drink plenty of fluids as discussed, use medicine as prescribed, and mucinex or delsym for cough. Return or see your doctor if further problems °

## 2016-07-31 NOTE — ED Triage Notes (Signed)
Pt here for cough, head congestion, and left ear pain and throat hurting from coughing.

## 2016-08-01 ENCOUNTER — Other Ambulatory Visit: Payer: BLUE CROSS/BLUE SHIELD

## 2016-08-02 ENCOUNTER — Other Ambulatory Visit: Payer: BLUE CROSS/BLUE SHIELD

## 2016-08-02 ENCOUNTER — Ambulatory Visit (INDEPENDENT_AMBULATORY_CARE_PROVIDER_SITE_OTHER): Payer: BLUE CROSS/BLUE SHIELD

## 2016-08-02 DIAGNOSIS — O09299 Supervision of pregnancy with other poor reproductive or obstetric history, unspecified trimester: Secondary | ICD-10-CM

## 2016-08-02 DIAGNOSIS — Z3682 Encounter for antenatal screening for nuchal translucency: Secondary | ICD-10-CM | POA: Diagnosis not present

## 2016-08-02 DIAGNOSIS — Z3A13 13 weeks gestation of pregnancy: Secondary | ICD-10-CM | POA: Diagnosis not present

## 2016-08-02 DIAGNOSIS — O34219 Maternal care for unspecified type scar from previous cesarean delivery: Secondary | ICD-10-CM

## 2016-08-02 NOTE — Progress Notes (Signed)
[place] 12+5 wks,measurements c/w dates,normal ov's bilat,crl 65.1 mm,post pl gr 0,NB present,NT 1.6 mm

## 2016-08-06 LAB — ANTIBODY SCREEN: ANTIBODY SCREEN: NEGATIVE

## 2016-08-06 LAB — CBC
HEMATOCRIT: 42.5 % (ref 34.0–46.6)
HEMOGLOBIN: 14.2 g/dL (ref 11.1–15.9)
MCH: 29 pg (ref 26.6–33.0)
MCHC: 33.4 g/dL (ref 31.5–35.7)
MCV: 87 fL (ref 79–97)
Platelets: 148 10*3/uL — ABNORMAL LOW (ref 150–379)
RBC: 4.89 x10E6/uL (ref 3.77–5.28)
RDW: 13.8 % (ref 12.3–15.4)
WBC: 4.5 10*3/uL (ref 3.4–10.8)

## 2016-08-06 LAB — PMP SCREEN PROFILE (10S), URINE
AMPHETAMINE SCRN UR: NEGATIVE ng/mL
Barbiturate Screen, Ur: NEGATIVE ng/mL
Benzodiazepine Screen, Urine: NEGATIVE ng/mL
CANNABINOIDS UR QL SCN: NEGATIVE ng/mL
COCAINE(METAB.) SCREEN, URINE: NEGATIVE ng/mL
Creatinine(Crt), U: 175.1 mg/dL (ref 20.0–300.0)
METHADONE SCREEN, URINE: NEGATIVE ng/mL
OPIATE SCRN UR: NEGATIVE ng/mL
OXYCODONE+OXYMORPHONE UR QL SCN: NEGATIVE ng/mL
PCP SCRN UR: NEGATIVE ng/mL
PROPOXYPHENE SCREEN: NEGATIVE ng/mL
Ph of Urine: 5.5 (ref 4.5–8.9)

## 2016-08-06 LAB — URINALYSIS, ROUTINE W REFLEX MICROSCOPIC
Bilirubin, UA: NEGATIVE
LEUKOCYTES UA: NEGATIVE
NITRITE UA: NEGATIVE
PH UA: 5.5 (ref 5.0–7.5)
Protein, UA: NEGATIVE
RBC UA: NEGATIVE
SPEC GRAV UA: 1.025 (ref 1.005–1.030)
Urobilinogen, Ur: 0.2 mg/dL (ref 0.2–1.0)

## 2016-08-06 LAB — ABO/RH: RH TYPE: POSITIVE

## 2016-08-06 LAB — CYSTIC FIBROSIS MUTATION 97: GENE DIS ANAL CARRIER INTERP BLD/T-IMP: NOT DETECTED

## 2016-08-06 LAB — HIV ANTIBODY (ROUTINE TESTING W REFLEX): HIV SCREEN 4TH GENERATION: NONREACTIVE

## 2016-08-06 LAB — VARICELLA ZOSTER ANTIBODY, IGG: Varicella zoster IgG: >4000 index (ref >165)

## 2016-08-06 LAB — RUBELLA SCREEN: Rubella Antibodies, IGG: 5.11 index (ref >0.99)

## 2016-08-06 LAB — RPR: RPR Ser Ql: NONREACTIVE

## 2016-08-06 LAB — HEPATITIS B SURFACE ANTIGEN: HEP B S AG: NEGATIVE

## 2016-08-08 LAB — MATERNAL SCREEN, INTEGRATED #1
CROWN RUMP LENGTH MAT SCREEN: 65.1 mm
GEST. AGE ON COLLECTION DATE: 12.7 wk
MATERNAL AGE AT EDD: 31.2 a
NUCHAL TRANSLUCENCY (NT): 1.6 mm
NUMBER OF FETUSES: 1
PAPP-A Value: 708.8 ng/mL
WEIGHT: 185 [lb_av]

## 2016-08-14 ENCOUNTER — Telehealth: Payer: Self-pay | Admitting: *Deleted

## 2016-08-14 NOTE — Telephone Encounter (Signed)
Patient called stating she has a yeast infection wanting to know if she can use Monistat capsule. Patient advised per Manus Gunning, to take Monistat 7 day cream instead since she is pregnant. Pt verbalized understanding.

## 2016-08-29 ENCOUNTER — Ambulatory Visit (INDEPENDENT_AMBULATORY_CARE_PROVIDER_SITE_OTHER): Payer: BLUE CROSS/BLUE SHIELD | Admitting: Advanced Practice Midwife

## 2016-08-29 VITALS — BP 112/70 | HR 70 | Wt 195.0 lb

## 2016-08-29 DIAGNOSIS — Z3482 Encounter for supervision of other normal pregnancy, second trimester: Secondary | ICD-10-CM

## 2016-08-29 DIAGNOSIS — Z369 Encounter for antenatal screening, unspecified: Secondary | ICD-10-CM

## 2016-08-29 DIAGNOSIS — Z363 Encounter for antenatal screening for malformations: Secondary | ICD-10-CM

## 2016-08-29 DIAGNOSIS — Z3A17 17 weeks gestation of pregnancy: Secondary | ICD-10-CM

## 2016-08-29 NOTE — Progress Notes (Signed)
G2P1001 [redacted]w[redacted]d Estimated Date of Delivery: 02/09/17  Blood pressure 112/70, weight 195 lb (88.5 kg), last menstrual period 05/11/2016.   BP weight and urine results all reviewed and noted.  Please refer to the obstetrical flow sheet for the fundal height and fetal heart rate documentation:  Patient  denies any bleeding and no rupture of membranes symptoms or regular contractions. Patient is without complaints. Has some HA's. Responded very well to accupressure in the offivce. Declines Rx for Fioricet All questions were answered.  Orders Placed This Encounter  Procedures  . US OB Comp + 14 Wk  . Maternal Screen, Integrated #2    Plan:  Continued routine obstetrical care, 2nd IT  Return in about 3 weeks (around 09/19/2016) for LROB, [id number].

## 2016-08-29 NOTE — Patient Instructions (Signed)
Second Trimester of Pregnancy The second trimester is from week 13 through week 28 (months 4 through 6). The second trimester is often a time when you feel your best. Your body has also adjusted to being pregnant, and you begin to feel better physically. Usually, morning sickness has lessened or quit completely, you may have more energy, and you may have an increase in appetite. The second trimester is also a time when the fetus is growing rapidly. At the end of the sixth month, the fetus is about 9 inches long and weighs about 1 pounds. You will likely begin to feel the baby move (quickening) between 18 and 20 weeks of the pregnancy. Body changes during your second trimester Your body continues to go through many changes during your second trimester. The changes vary from woman to woman.  Your weight will continue to increase. You will notice your lower abdomen bulging out.  You may begin to get stretch marks on your hips, abdomen, and breasts.  You may develop headaches that can be relieved by medicines. The medicines should be approved by your health care provider.  You may urinate more often because the fetus is pressing on your bladder.  You may develop or continue to have heartburn as a result of your pregnancy.  You may develop constipation because certain hormones are causing the muscles that push waste through your intestines to slow down.  You may develop hemorrhoids or swollen, bulging veins (varicose veins).  You may have back pain. This is caused by:  Weight gain.  Pregnancy hormones that are relaxing the joints in your pelvis.  A shift in weight and the muscles that support your balance.  Your breasts will continue to grow and they will continue to become tender.  Your gums may bleed and may be sensitive to brushing and flossing.  Dark spots or blotches (chloasma, mask of pregnancy) may develop on your face. This will likely fade after the baby is born.  A dark line  from your belly button to the pubic area (linea nigra) may appear. This will likely fade after the baby is born.  You may have changes in your hair. These can include thickening of your hair, rapid growth, and changes in texture. Some women also have hair loss during or after pregnancy, or hair that feels dry or thin. Your hair will most likely return to normal after your baby is born. What to expect at prenatal visits During a routine prenatal visit:  You will be weighed to make sure you and the fetus are growing normally.  Your blood pressure will be taken.  Your abdomen will be measured to track your baby's growth.  The fetal heartbeat will be listened to.  Any test results from the previous visit will be discussed. Your health care provider may ask you:  How you are feeling.  If you are feeling the baby move.  If you have had any abnormal symptoms, such as leaking fluid, bleeding, severe headaches, or abdominal cramping.  If you are using any tobacco products, including cigarettes, chewing tobacco, and electronic cigarettes.  If you have any questions. Other tests that may be performed during your second trimester include:  Blood tests that check for:  Low iron levels (anemia).  Gestational diabetes (between 24 and 28 weeks).  Rh antibodies. This is to check for a protein on red blood cells (Rh factor).  Urine tests to check for infections, diabetes, or protein in the urine.  An ultrasound to  confirm the proper growth and development of the baby.  An amniocentesis to check for possible genetic problems.  Fetal screens for spina bifida and Down syndrome.  HIV (human immunodeficiency virus) testing. Routine prenatal testing includes screening for HIV, unless you choose not to have this test. Follow these instructions at home: Eating and drinking  Continue to eat regular, healthy meals.  Avoid raw meat, uncooked cheese, cat litter boxes, and soil used by cats. These  carry germs that can cause birth defects in the baby.  Take your prenatal vitamins.  Take 1500-2000 mg of calcium daily starting at the 20th week of pregnancy until you deliver your baby.  If you develop constipation:  Take over-the-counter or prescription medicines.  Drink enough fluid to keep your urine clear or pale yellow.  Eat foods that are high in fiber, such as fresh fruits and vegetables, whole grains, and beans.  Limit foods that are high in fat and processed sugars, such as fried and sweet foods. Activity  Exercise only as directed by your health care provider. Experiencing uterine cramps is a good sign to stop exercising.  Avoid heavy lifting, wear low heel shoes, and practice good posture.  Wear your seat belt at all times when driving.  Rest with your legs elevated if you have leg cramps or low back pain.  Wear a good support bra for breast tenderness.  Do not use hot tubs, steam rooms, or saunas. Lifestyle  Avoid all smoking, herbs, alcohol, and unprescribed drugs. These chemicals affect the formation and growth of the baby.  Do not use any products that contain nicotine or tobacco, such as cigarettes and e-cigarettes. If you need help quitting, ask your health care provider.  A sexual relationship may be continued unless your health care provider directs you otherwise. General instructions  Follow your health care provider's instructions regarding medicine use. There are medicines that are either safe or unsafe to take during pregnancy.  Take warm sitz baths to soothe any pain or discomfort caused by hemorrhoids. Use hemorrhoid cream if your health care provider approves.  If you develop varicose veins, wear support hose. Elevate your feet for 15 minutes, 3-4 times a day. Limit salt in your diet.  Visit your dentist if you have not gone yet during your pregnancy. Use a soft toothbrush to brush your teeth and be gentle when you floss.  Keep all follow-up  prenatal visits as told by your health care provider. This is important. Contact a health care provider if:  You have dizziness.  You have mild pelvic cramps, pelvic pressure, or nagging pain in the abdominal area.  You have persistent nausea, vomiting, or diarrhea.  You have a bad smelling vaginal discharge.  You have pain with urination. Get help right away if:  You have a fever.  You are leaking fluid from your vagina.  You have spotting or bleeding from your vagina.  You have severe abdominal cramping or pain.  You have rapid weight gain or weight loss.  You have shortness of breath with chest pain.  You notice sudden or extreme swelling of your face, hands, ankles, feet, or legs.  You have not felt your baby move in over an hour.  You have severe headaches that do not go away with medicine.  You have vision changes. Summary  The second trimester is from week 13 through week 28 (months 4 through 6). It is also a time when the fetus is growing rapidly.  Your body goes  through many changes during pregnancy. The changes vary from woman to woman.  Avoid all smoking, herbs, alcohol, and unprescribed drugs. These chemicals affect the formation and growth your baby.  Do not use any tobacco products, such as cigarettes, chewing tobacco, and e-cigarettes. If you need help quitting, ask your health care provider.  Contact your health care provider if you have any questions. Keep all prenatal visits as told by your health care provider. This is important. This information is not intended to replace advice given to you by your health care provider. Make sure you discuss any questions you have with your health care provider. Document Released: 09/18/2001 Document Revised: 03/01/2016 Document Reviewed: 11/25/2012 Elsevier Interactive Patient Education  2017 [employer].

## 2016-09-01 LAB — MATERNAL SCREEN, INTEGRATED #2
AFP MoM: 1.67
Alpha-Fetoprotein: 47.9 ng/mL
CROWN RUMP LENGTH: 65.1 mm
DIA MOM: 1.08
DIA VALUE: 165.5 pg/mL
ESTRIOL UNCONJUGATED: 0.44 ng/mL
Gest. Age on Collection Date: 12.7 weeks
Gestational Age: 16.6 weeks
HCG MOM: 1.44
Maternal Age at EDD: 31.2 years
NUCHAL TRANSLUCENCY (NT): 1.6 mm
NUCHAL TRANSLUCENCY MOM: 0.98
Number of Fetuses: 1
PAPP-A MoM: 0.88
PAPP-A Value: 708.8 ng/mL
TEST RESULTS: NEGATIVE
Weight: 185 [lb_av]
Weight: 185 [lb_av]
hCG Value: 38.5 IU/mL
uE3 MoM: 0.51

## 2016-09-19 ENCOUNTER — Other Ambulatory Visit: Payer: BLUE CROSS/BLUE SHIELD

## 2016-09-19 ENCOUNTER — Encounter: Payer: BLUE CROSS/BLUE SHIELD | Admitting: Advanced Practice Midwife

## 2016-09-26 ENCOUNTER — Ambulatory Visit (INDEPENDENT_AMBULATORY_CARE_PROVIDER_SITE_OTHER): Payer: BLUE CROSS/BLUE SHIELD | Admitting: Obstetrics and Gynecology

## 2016-09-26 ENCOUNTER — Ambulatory Visit (INDEPENDENT_AMBULATORY_CARE_PROVIDER_SITE_OTHER): Payer: BLUE CROSS/BLUE SHIELD

## 2016-09-26 ENCOUNTER — Encounter: Payer: Self-pay | Admitting: Obstetrics and Gynecology

## 2016-09-26 VITALS — BP 122/70 | HR 72 | Wt 198.8 lb

## 2016-09-26 DIAGNOSIS — Z3A21 21 weeks gestation of pregnancy: Secondary | ICD-10-CM

## 2016-09-26 DIAGNOSIS — Z3482 Encounter for supervision of other normal pregnancy, second trimester: Secondary | ICD-10-CM

## 2016-09-26 DIAGNOSIS — Z3402 Encounter for supervision of normal first pregnancy, second trimester: Secondary | ICD-10-CM

## 2016-09-26 DIAGNOSIS — Z1389 Encounter for screening for other disorder: Secondary | ICD-10-CM

## 2016-09-26 DIAGNOSIS — O09299 Supervision of pregnancy with other poor reproductive or obstetric history, unspecified trimester: Secondary | ICD-10-CM

## 2016-09-26 DIAGNOSIS — Z363 Encounter for antenatal screening for malformations: Secondary | ICD-10-CM | POA: Diagnosis not present

## 2016-09-26 DIAGNOSIS — Z331 Pregnant state, incidental: Secondary | ICD-10-CM

## 2016-09-26 DIAGNOSIS — O34219 Maternal care for unspecified type scar from previous cesarean delivery: Secondary | ICD-10-CM

## 2016-09-26 LAB — POCT URINALYSIS DIPSTICK
Glucose, UA: NEGATIVE
Ketones, UA: NEGATIVE
LEUKOCYTES UA: NEGATIVE
NITRITE UA: NEGATIVE
Protein, UA: NEGATIVE
RBC UA: NEGATIVE

## 2016-09-26 NOTE — Progress Notes (Signed)
Anatomy US today at 20+[redacted] weeks GA. Single, active female fetus in a variable presentation. Cervix is closed and measures 3.33 cm. Amniotic fluid is normal with a SVP 6.1 cm. Posterior Gr 0 placenta. FHR 150 bpm. Bilateral ovaries appear normal. EFW today 342 g is consistent with dating. Anatomy visualized and appears normal.

## 2016-09-26 NOTE — Progress Notes (Signed)
G2P1001  Estimated Date of Delivery: 02/09/17 LROB [redacted]w[redacted]d  Blood pressure 122/70, pulse 72, weight 198 lb 12.8 oz (90.2 kg), last menstrual period 05/11/2016.   Urine results: negative   Chief Complaint  Patient presents with  . Routine Prenatal Visit    Patient complaints: none at this time. Patient reports   good fetal movement,                           denies any bleeding , rupture of membranes,or regular contractions.   refer to the ob flow sheet for FH and FHR, ,                          Physical Examination: General appearance - alert, well appearing, and in no distress                                      Abdomen - FH 21 ,                                                         -FHR 143                                                         soft, nontender, nondistended, no masses or organomegaly                                      Pelvic - examination not indicated                                            Questions were answered. 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 She states she would like to attempt VBAC with current . [place] form at next appt.  Plan:  Continued routine obstetrical care,   F/u in 4 weeks for LROB  By signing my name below, I, Sonum Patel, attest that this documentation has been prepared under the direction and in the presence of Mallory Shirk, MD. Electronically Signed: Sonum Patel, Education administrator. 09/26/16. 2:13 PM.  I personally performed the services described in this documentation, which was SCRIBED in my presence. The recorded information has been reviewed and considered accurate. It has been edited as necessary during review. Jonnie Kind, MD

## 2016-09-28 LAB — GC/CHLAMYDIA PROBE AMP
CHLAMYDIA, DNA PROBE: NEGATIVE
NEISSERIA GONORRHOEAE BY PCR: NEGATIVE

## 2016-10-08 NOTE — L&D Delivery Note (Signed)
.  Delivery Note At 11:11 PM a viable female was delivered via VBAC (Presentation: LOA ;  ).  APGAR: 8, 9; weight pending .    Placenta status: Spontaneous, intact .  Cord: 3 vessel  with the following complications: nuchal x1, reduced prior to delivery.    Anesthesia:  epidural Episiotomy: None Lacerations: 1st degree;Vaginal Suture Repair: vicryl rapide  Est. Blood Loss (mL): 400  Mom to postpartum.  Baby to Couplet care / Skin to Skin.  Len Blalock 01/26/2017, 12:04 AM  Patient is a G2P1001 at [redacted]w[redacted]d who was admitted for IOL due to gHTN, significant hx of eclampsia during 1st labor requiring C/S. She progressed with augmentation via foley, Pit.  I was gloved and present for delivery in its entirety.  Second stage of labor progressed, baby progressed to SVD.  Mild decels during second stage noted.  Complications: none  Lacerations: 1st deg vag  EBL: 400cc  Serita Grammes, CNM 3:34 AM  01/26/2017

## 2016-10-24 ENCOUNTER — Encounter: Payer: BLUE CROSS/BLUE SHIELD | Admitting: Advanced Practice Midwife

## 2016-10-31 ENCOUNTER — Ambulatory Visit (INDEPENDENT_AMBULATORY_CARE_PROVIDER_SITE_OTHER): Payer: BLUE CROSS/BLUE SHIELD | Admitting: Advanced Practice Midwife

## 2016-10-31 ENCOUNTER — Encounter: Payer: Self-pay | Admitting: Advanced Practice Midwife

## 2016-10-31 VITALS — BP 100/70 | HR 80 | Wt 206.0 lb

## 2016-10-31 DIAGNOSIS — Z331 Pregnant state, incidental: Secondary | ICD-10-CM

## 2016-10-31 DIAGNOSIS — Z3A25 25 weeks gestation of pregnancy: Secondary | ICD-10-CM

## 2016-10-31 DIAGNOSIS — Z3482 Encounter for supervision of other normal pregnancy, second trimester: Secondary | ICD-10-CM

## 2016-10-31 DIAGNOSIS — Z1389 Encounter for screening for other disorder: Secondary | ICD-10-CM

## 2016-10-31 LAB — POCT URINALYSIS DIPSTICK
Blood, UA: NEGATIVE
Glucose, UA: NEGATIVE
LEUKOCYTES UA: NEGATIVE
Nitrite, UA: NEGATIVE
PROTEIN UA: NEGATIVE

## 2016-10-31 NOTE — Progress Notes (Signed)
G2P1001 [redacted]w[redacted]d Estimated Date of Delivery: 02/09/17  Blood pressure 100/70, pulse 80, weight 206 lb (93.4 kg), last menstrual period 05/11/2016.   BP weight and urine results all reviewed and noted.  Please refer to the obstetrical flow sheet for the fundal height and fetal heart rate documentation:  Patient reports good fetal movement, denies any bleeding and no rupture of membranes symptoms or regular contractions. Patient started having sore throat today, feels hoarse.  No other sx All questions were answered.    Orders Placed This Encounter  Procedures  . POCT urinalysis dipstick    Plan:  Continued routine obstetrical care,   Return in about 3 weeks (around 11/21/2016) for PN2/LROB.

## 2016-10-31 NOTE — Patient Instructions (Addendum)
1. Before your test, do not eat or drink anything for 8-10 hours prior to your  appointment (a small amount of water is allowed and you may take any medicines you normally take). Be sure to drink lots of water the day before. 2. When you arrive, your blood will be drawn for a 'fasting' blood sugar level.  Then you will be given a sweetened carbonated beverage to drink. You should  complete drinking this beverage within five minutes. After finishing the  beverage, you will have your blood drawn exactly 1 and 2 hours later. Having  your blood drawn on time is an important part of this test. A total of three blood  samples will be done. 3. The test takes approximately 2  hours. During the test, do not have anything to  eat or drink. Do not smoke, chew gum (not even sugarless gum) or use breath mints.  4. During the test you should remain close by and seated as much as possible and  avoid walking around. You may want to bring a book or something else to  occupy your time.  5. After your test, you may eat and drink as normal. You may want to bring a snack  to eat after the test is finished. Your provider will advise you as to the results of  this test and any follow-up if necessary  If your sugar test is positive for gestational diabetes, you will be given an phone call and further instructions discussed. If you wish to know all of your test results before your next appointment, feel free to call the office, or look up your test results on Mychart.  (The range that the lab uses for normal values of the sugar test are not necessarily the range that is used for pregnant women; if your results are within the normal range, they are definitely normal.  However, if a value is deemed "high" by the lab, it may not be too high for a pregnant woman.  We will need to discuss the results if your value(s) fall in the "high" category).     Tdap Vaccine  It is recommended that you get the Tdap vaccine during the  third trimester of EACH pregnancy to help protect your baby from getting pertussis (whooping cough)  27-36 weeks is the BEST time to do this so that you can pass the protection on to your baby. During pregnancy is better than after pregnancy, but if you are unable to get it during pregnancy it will be offered at the hospital.  You can get this vaccine at the health department or your family doctor, as well as some pharmacies.  Everyone who will be around your baby should also be up-to-date on their vaccines. Adults (who are not pregnant) only need 1 dose of Tdap during adulthood.     Safe Medications in Pregnancy   Acne: Benzoyl Peroxide Salicylic Acid  Backache/Headache: Tylenol: 2 regular strength every 4 hours OR              2 Extra strength every 6 hours  Colds/Coughs/Allergies: Benadryl (alcohol free) 25 mg every 6 hours as needed Breath right strips Claritin Cepacol throat lozenges Chloraseptic throat spray Cold-Eeze- up to three times per day Cough drops, alcohol free Flonase (by prescription only) Guaifenesin Mucinex Robitussin DM (plain only, alcohol free) Saline nasal spray/drops Sudafed (pseudoephedrine) & Actifed ** use only after [redacted] weeks gestation and if you do not have high blood pressure Tylenol Vicks Vaporub  Zinc lozenges Zyrtec   Constipation: Colace Ducolax suppositories Fleet enema Glycerin suppositories Metamucil Milk of magnesia Miralax Senokot Smooth move tea  Diarrhea: Kaopectate Imodium A-D  *NO pepto Bismol  Hemorrhoids: Anusol Anusol HC Preparation H Tucks  Indigestion: Tums Maalox Mylanta Zantac  Pepcid  Insomnia: Benadryl (alcohol free) 25mg  every 6 hours as needed Tylenol PM Unisom, no Gelcaps  Leg Cramps: Tums MagGel  Nausea/Vomiting:  Bonine Dramamine Emetrol Ginger extract Sea bands Meclizine  Nausea medication to take during pregnancy:  Unisom (doxylamine succinate 25 mg tablets) Take one tablet  daily at bedtime. If symptoms are not adequately controlled, the dose can be increased to a maximum recommended dose of two tablets daily (1/2 tablet in the morning, 1/2 tablet mid-afternoon and one at bedtime). Vitamin B6 100mg  tablets. Take one tablet twice a day (up to 200 mg per day).  Skin Rashes: Aveeno products Benadryl cream or 25mg  every 6 hours as needed Calamine Lotion 1% cortisone cream  Yeast infection: Gyne-lotrimin 7 Monistat 7   **If taking multiple medications, please check labels to avoid duplicating the same active ingredients **take medication as directed on the label ** Do not exceed 4000 mg of tylenol in 24 hours **Do not take medications that contain aspirin or ibuprofen

## 2016-11-21 ENCOUNTER — Other Ambulatory Visit: Payer: BLUE CROSS/BLUE SHIELD

## 2016-11-21 ENCOUNTER — Encounter: Payer: Self-pay | Admitting: Advanced Practice Midwife

## 2016-11-21 ENCOUNTER — Ambulatory Visit (INDEPENDENT_AMBULATORY_CARE_PROVIDER_SITE_OTHER): Payer: BLUE CROSS/BLUE SHIELD | Admitting: Advanced Practice Midwife

## 2016-11-21 VITALS — BP 120/80 | HR 74 | Wt 209.0 lb

## 2016-11-21 DIAGNOSIS — Z3483 Encounter for supervision of other normal pregnancy, third trimester: Secondary | ICD-10-CM

## 2016-11-21 DIAGNOSIS — Z331 Pregnant state, incidental: Secondary | ICD-10-CM

## 2016-11-21 DIAGNOSIS — O34219 Maternal care for unspecified type scar from previous cesarean delivery: Secondary | ICD-10-CM

## 2016-11-21 DIAGNOSIS — Z131 Encounter for screening for diabetes mellitus: Secondary | ICD-10-CM

## 2016-11-21 DIAGNOSIS — Z1389 Encounter for screening for other disorder: Secondary | ICD-10-CM

## 2016-11-21 DIAGNOSIS — Z3A29 29 weeks gestation of pregnancy: Secondary | ICD-10-CM

## 2016-11-21 DIAGNOSIS — Z3482 Encounter for supervision of other normal pregnancy, second trimester: Secondary | ICD-10-CM

## 2016-11-21 LAB — POCT URINALYSIS DIPSTICK
Blood, UA: NEGATIVE
Glucose, UA: NEGATIVE
Ketones, UA: NEGATIVE
LEUKOCYTES UA: NEGATIVE
NITRITE UA: NEGATIVE

## 2016-11-21 NOTE — Progress Notes (Signed)
G2P1001 [redacted]w[redacted]d Estimated Date of Delivery: 02/09/17  Blood pressure 120/80, pulse 74, weight 209 lb (94.8 kg), last menstrual period 05/11/2016.   BP weight and urine results all reviewed and noted.  Please refer to the obstetrical flow sheet for the fundal height and fetal heart rate documentation:  Patient reports good fetal movement, denies any bleeding and no rupture of membranes symptoms or regular contractions. Patient is without complaints. All questions were answered.  Orders Placed This Encounter  Procedures  . POCT urinalysis dipstick    Plan:  Continued routine obstetrical care, PN2 today  Return in about 3 weeks (around 12/12/2016) for LROB.

## 2016-11-22 ENCOUNTER — Encounter: Payer: Self-pay | Admitting: Advanced Practice Midwife

## 2016-11-22 ENCOUNTER — Other Ambulatory Visit: Payer: Self-pay | Admitting: *Deleted

## 2016-11-22 ENCOUNTER — Telehealth: Payer: Self-pay | Admitting: *Deleted

## 2016-11-22 DIAGNOSIS — O24410 Gestational diabetes mellitus in pregnancy, diet controlled: Secondary | ICD-10-CM | POA: Insufficient documentation

## 2016-11-22 LAB — RPR: RPR Ser Ql: NONREACTIVE

## 2016-11-22 LAB — CBC
HEMATOCRIT: 40.1 % (ref 34.0–46.6)
Hemoglobin: 13.4 g/dL (ref 11.1–15.9)
MCH: 30.2 pg (ref 26.6–33.0)
MCHC: 33.4 g/dL (ref 31.5–35.7)
MCV: 91 fL (ref 79–97)
Platelets: 180 10*3/uL (ref 150–379)
RBC: 4.43 x10E6/uL (ref 3.77–5.28)
RDW: 13.8 % (ref 12.3–15.4)
WBC: 10.1 10*3/uL (ref 3.4–10.8)

## 2016-11-22 LAB — GLUCOSE TOLERANCE, 2 HOURS W/ 1HR
Glucose, 1 hour: 191 mg/dL — ABNORMAL HIGH (ref 65–179)
Glucose, 2 hour: 82 mg/dL (ref 65–152)
Glucose, Fasting: 73 mg/dL (ref 65–91)

## 2016-11-22 LAB — HIV ANTIBODY (ROUTINE TESTING W REFLEX): HIV Screen 4th Generation wRfx: NONREACTIVE

## 2016-11-22 LAB — ANTIBODY SCREEN: ANTIBODY SCREEN: NEGATIVE

## 2016-11-22 NOTE — Telephone Encounter (Signed)
Informed patient that she is gestational diabetic and a referral was sent. She should receive a call from them within a week for an appointment and if not to give [place] a call back so we can check on status. Pt verbalized understanding.

## 2016-12-06 ENCOUNTER — Encounter: Payer: BLUE CROSS/BLUE SHIELD | Attending: Advanced Practice Midwife | Admitting: Nutrition

## 2016-12-06 ENCOUNTER — Encounter: Payer: Self-pay | Admitting: Nutrition

## 2016-12-06 VITALS — Ht 67.0 in | Wt 210.0 lb

## 2016-12-06 DIAGNOSIS — Z713 Dietary counseling and surveillance: Secondary | ICD-10-CM | POA: Insufficient documentation

## 2016-12-06 DIAGNOSIS — Z6832 Body mass index (BMI) 32.0-32.9, adult: Secondary | ICD-10-CM | POA: Insufficient documentation

## 2016-12-06 DIAGNOSIS — O24410 Gestational diabetes mellitus in pregnancy, diet controlled: Secondary | ICD-10-CM | POA: Insufficient documentation

## 2016-12-06 DIAGNOSIS — Z3A00 Weeks of gestation of pregnancy not specified: Secondary | ICD-10-CM | POA: Insufficient documentation

## 2016-12-06 DIAGNOSIS — O24419 Gestational diabetes mellitus in pregnancy, unspecified control: Secondary | ICD-10-CM

## 2016-12-06 NOTE — Patient Instructions (Addendum)
Goals Follow Gestational Meal Plan as discussed Eat 30 gram of carbs and protein with breakfast( no fruit) Lunch and dinner 45-50 grams of carbs with 3-4 oz protein and veggies Snacks: 15-30 grams of carbs plus protein/veggies Exercise 30 minutes per day Goals FBS before breakfast 60-95 mg/dl 2 hours after meals < 120 mg/dl Drink only water-cut out coke zero Eat meals/snacks on time Do not skip meals or snacks Record BS readings on sheet and bring meter and logs sheets to all appointments.

## 2016-12-06 NOTE — Progress Notes (Signed)
Diabetes Self-Management Education  Visit Type: First/Initial  Appt. Start Time: 0800 Appt. End Time: 0900  12/06/2016  Allison [name], identified by name and date of birth, is a 31 y.o. female with a diagnosis of Diabetes: Gestational Diabetes. EDC 02/09/17  G 2 P 1. Here with her husband. Other child is 4 yrs old. Had preeclampia and delivered 1 month early last pregnancy. She has gastric sleeve done 2016. Lost 145 lbs with gastric sleeve. Has gained 20 lbs so far this pregnancy. Admits to craving a lot of salty foods.  GTT  FBS 73 mg/dl  1 hr 191 mg/dl and 2 hour 82 mg/dl   ASSESSMENT  Height 5\' 7"  (1.702 m), weight 210 lb (95.3 kg), last menstrual period 05/11/2016. Body mass index is 32.89 kg/m.      Diabetes Self-Management Education - 12/06/16 0801      Visit Information   Visit Type First/Initial     Initial Visit   Diabetes Type Gestational Diabetes   Are you currently following a meal plan? No   Are you taking your medications as prescribed? Not on Medications   Date Diagnosed 11/2016     Health Coping   How would you rate your overall health? Good     Psychosocial Assessment   Patient Belief/Attitude about Diabetes Motivated to manage diabetes   Self-care barriers None   Other persons present Patient   Patient Concerns Nutrition/Meal planning;Medication;Monitoring;Healthy Lifestyle;Problem Solving   Special Needs None   Preferred Learning Style Visual;No preference indicated   Learning Readiness Not Ready   How often do you need to have someone help you when you read instructions, pamphlets, or other written materials from your doctor or pharmacy? 1 - Never   What is the last grade level you completed in school? 12     Pre-Education Assessment   Patient understands the diabetes disease and treatment process. Needs Instruction   Patient understands incorporating nutritional management into lifestyle. Needs Instruction   Patient undertands incorporating  physical activity into lifestyle. Needs Instruction   Patient understands using medications safely. Needs Instruction   Patient understands monitoring blood glucose, interpreting and using results Needs Instruction   Patient understands prevention, detection, and treatment of acute complications. Needs Instruction   Patient understands prevention, detection, and treatment of chronic complications. Needs Instruction   Patient understands how to develop strategies to address psychosocial issues. Needs Instruction   Patient understands how to develop strategies to promote health/change behavior. Needs Instruction     Complications   How often do you check your blood sugar? 0 times/day (not testing)   Have you had a dilated eye exam in the past 12 months? No   Have you had a dental exam in the past 12 months? No   Are you checking your feet? Yes   How many days per week are you checking your feet? 7     Dietary Intake   Breakfast Left overs; protein shakes; nothing this am   Snack (morning) none, coke zero   Lunch Protein shake-homemade:-vegan protein, almond milk 1/2 c frozen fruit   Snack (afternoon) Coke zero   Dinner 1/2 chick fila sandiwich, coke zero   Beverage(s) water, coke zero     Exercise   Exercise Type ADL's   How many days per week to you exercise? 0     Patient Education   Disease state  Definition of diabetes, type 1 and 2, and the diagnosis of diabetes;Factors that contribute to the development  of diabetes;Explored patient's options for treatment of their diabetes   Nutrition management  Role of diet in the treatment of diabetes and the relationship between the three main macronutrients and blood glucose level;Food label reading, portion sizes and measuring food.;Carbohydrate counting;Meal timing in regards to the patients' current diabetes medication.;Information on hints to eating out and maintain blood glucose control.;Meal options for control of blood glucose level and  chronic complications.   Physical activity and exercise  Role of exercise on diabetes management, blood pressure control and cardiac health.;Identified with patient nutritional and/or medication changes necessary with exercise.;Helped patient identify appropriate exercises in relation to his/her diabetes, diabetes complications and other health issue.   Monitoring Taught/evaluated SMBG meter.;Purpose and frequency of SMBG.;Taught/discussed recording of test results and interpretation of SMBG.;Identified appropriate SMBG and/or A1C goals.;Daily foot exams   Chronic complications Relationship between chronic complications and blood glucose control;Identified and discussed with patient  current chronic complications;Nephropathy, what it is, prevention of, the use of ACE, ARB's and early detection of through urine microalbumia.;Retinopathy and reason for yearly dilated eye exams   Psychosocial adjustment Worked with patient to identify barriers to care and solutions;Role of stress on diabetes;Helped patient identify a support system for diabetes management;Identified and addressed patients feelings and concerns about diabetes;Brainstormed with patient on coping mechanisms for social situations, getting support from significant others, dealing with feelings about diabetes   Preconception care Pregnancy and GDM  Role of pre-pregnancy blood glucose control on the development of the fetus;Reviewed with patient blood glucose goals with pregnancy;Role of family planning for patients with diabetes   Personal strategies to promote health Lifestyle issues that need to be addressed for better diabetes care;Helped patient develop diabetes management plan for (enter comment)     Individualized Goals (developed by patient)   Nutrition Follow meal plan discussed;General guidelines for healthy choices and portions discussed;Adjust meds/carbs with exercise as discussed   Physical Activity Exercise 3-5 times per week;30  minutes per day   Medications Not Applicable   Monitoring  test my blood glucose as discussed;test blood glucose pre and post meals as discussed   Reducing Risk examine blood glucose patterns;do foot checks daily;treat hypoglycemia with 15 grams of carbs if blood glucose less than 70mg /dL;increase portions of nuts and seeds     Post-Education Assessment   Patient understands the diabetes disease and treatment process. Needs Review   Patient understands incorporating nutritional management into lifestyle. Needs Review   Patient undertands incorporating physical activity into lifestyle. Needs Review   Patient understands using medications safely. Needs Review   Patient understands monitoring blood glucose, interpreting and using results Needs Review   Patient understands prevention, detection, and treatment of acute complications. Needs Review   Patient understands prevention, detection, and treatment of chronic complications. Needs Review   Patient understands how to develop strategies to address psychosocial issues. Needs Review   Patient understands how to develop strategies to promote health/change behavior. Needs Review     Outcomes   Expected Outcomes Demonstrated interest in learning. Expect positive outcomes   Future DMSE --  7 days   Program Status Completed      Individualized Plan for Diabetes Self-Management Training:   Learning Objective:  Patient will have a greater understanding of diabetes self-management. Patient education plan is to attend individual and/or group sessions per assessed needs and concerns.   Blood Glucose Monitoring Instruction  She does not have her own meter.   Meter Provided: Accucheck Verio Flex  Lot [id number]  Expiration Date:  Medications: 05/10/17     Intervention:    Explained rationale of testing BG to obtain data as to how their diabetes is being managed.  Provided Target Ranges for both pre and post meals  Explained factors that  effect BG including food (carbohydrate), stress, activity level and insulin availability in the body including diabetes medications  Taught patient techniques for using BG monitor and lancing device  Discussed need for Rx for strips and lancets   Explained rationale of recording BG both for patient and MD to assess patterns as needed.  Follow Up: Patient offered follow up as needed. Plan:  Goals Follow Gestational Meal Plan as discussed Eat 30 gram of carbs and protein with breakfast( no fruit) Lunch and dinner 45-50 grams of carbs with 3-4 oz protein and veggies Snacks: 15-30 grams of carbs plus protein/veggies Exercise 30 minutes per day Goals FBS before breakfast 60-95 mg/dl 2 hours after meals < 120 mg/dl Drink only water-cut out coke zero Eat meals/snacks on time Do not skip meals or snacks Record BS readings on sheet and bring meter and logs sheets to all appointments.  Expected Outcomes:  Demonstrated interest in learning. Expect positive outcomes  Education material provided: Gestational DM Packet   Meal Plan Card  If problems or questions, patient to contact team via:  Phone and Email  Future DSME appointment:  (7 days)

## 2016-12-09 IMAGING — US US ABDOMEN LIMITED
1 series · 14 of 25 positions shown · non-contrast
Comparison: Ultrasound 02/23/2016.

CLINICAL DATA: Intermittent right upper quadrant abdominal pain
with nausea, vomiting and diarrhea since cholecystectomy 3 months
ago. Eight weeks pregnant.

EXAM:
US ABDOMEN LIMITED - RIGHT UPPER QUADRANT

[Series 1: us abdomen limited · 0.22mm/px · 14 of 28 slices shown]
[im 1/28]
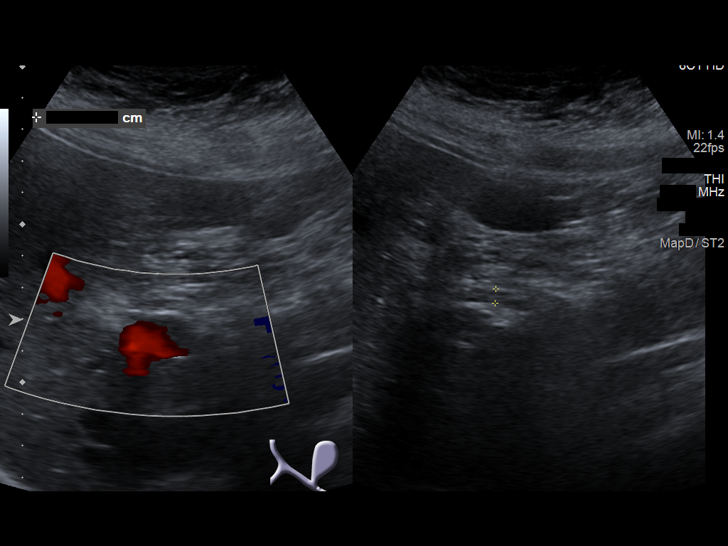
[im 3/28]
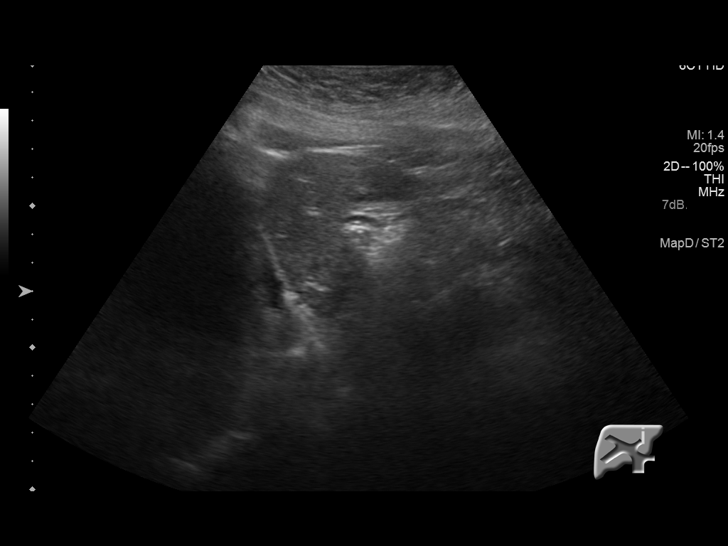
[im 5/28]
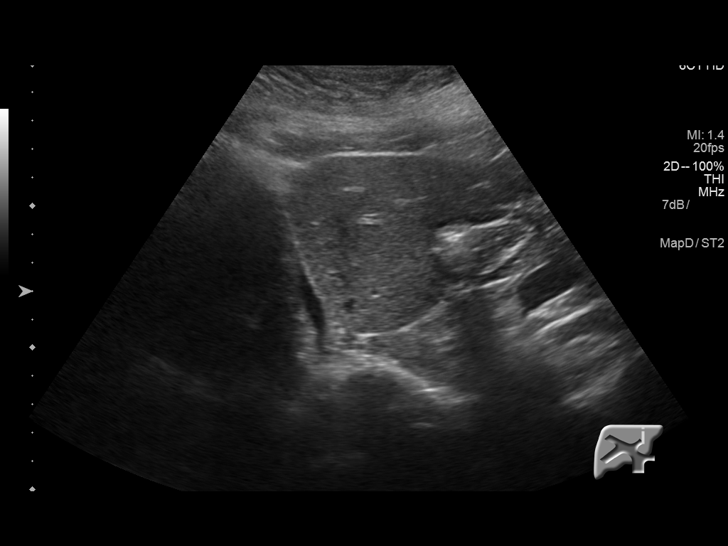
[im 7/28]
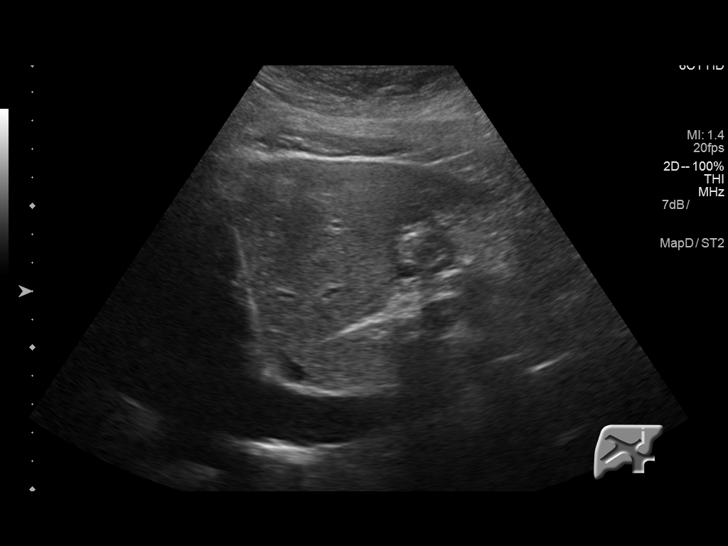
[im 10/28]
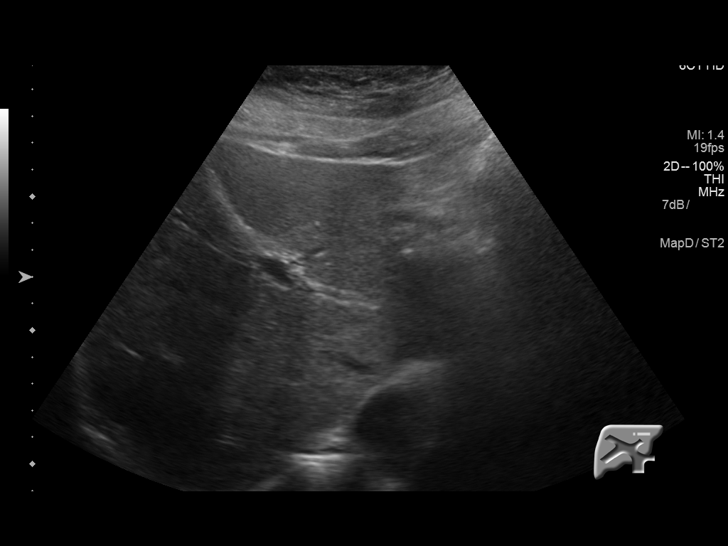
[im 11/28]
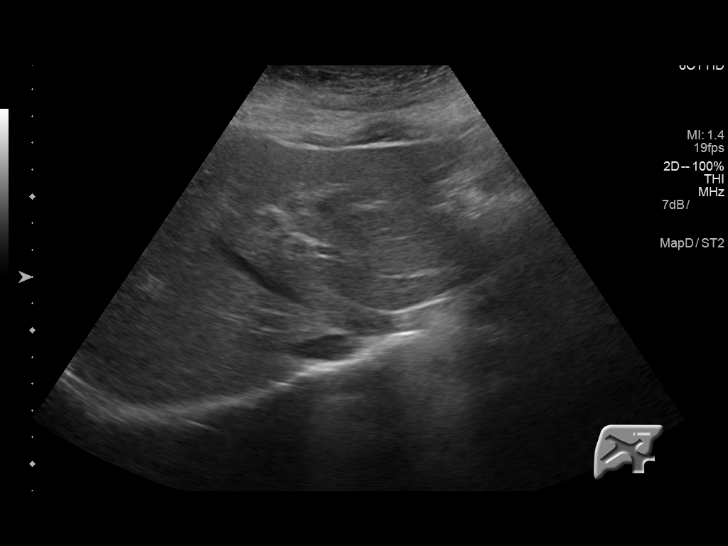
[im 13/28]
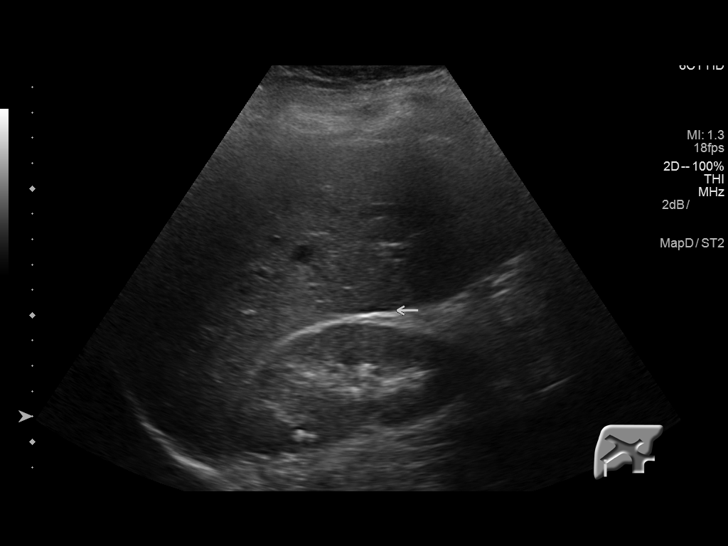
[im 15/28]
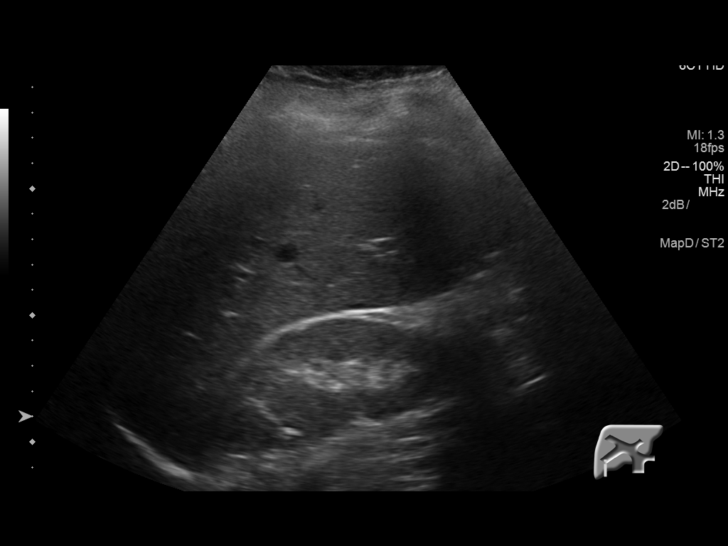
[im 17/28]
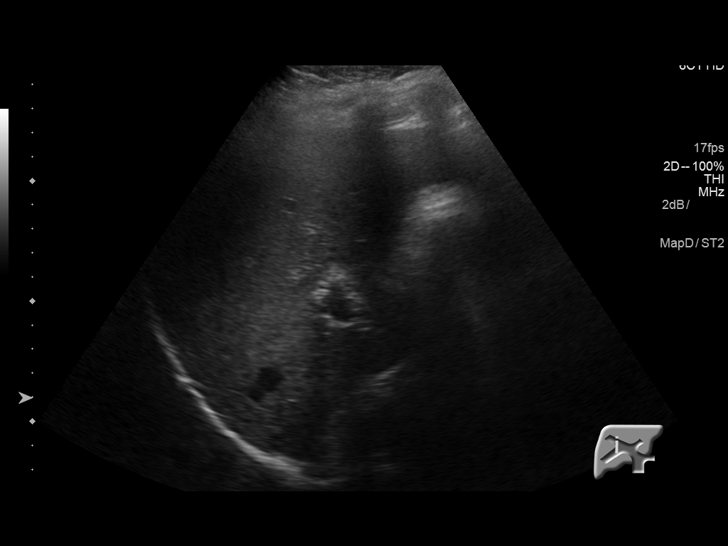
[im 19/28]
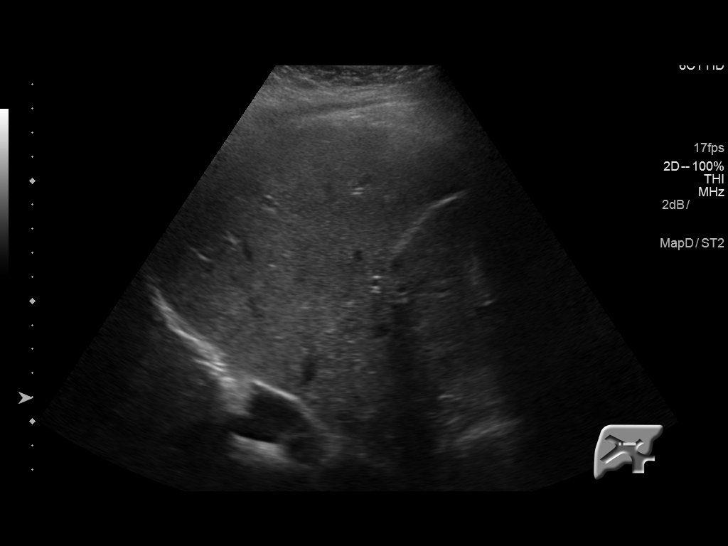
[im 21/28]
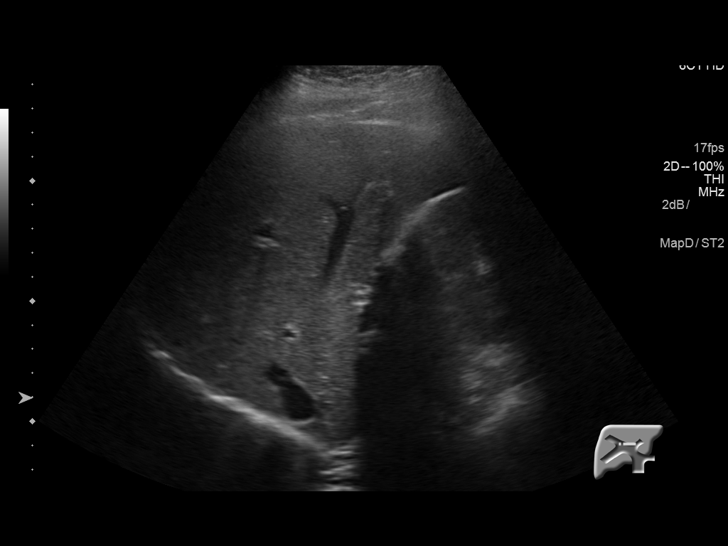
[im 23/28]
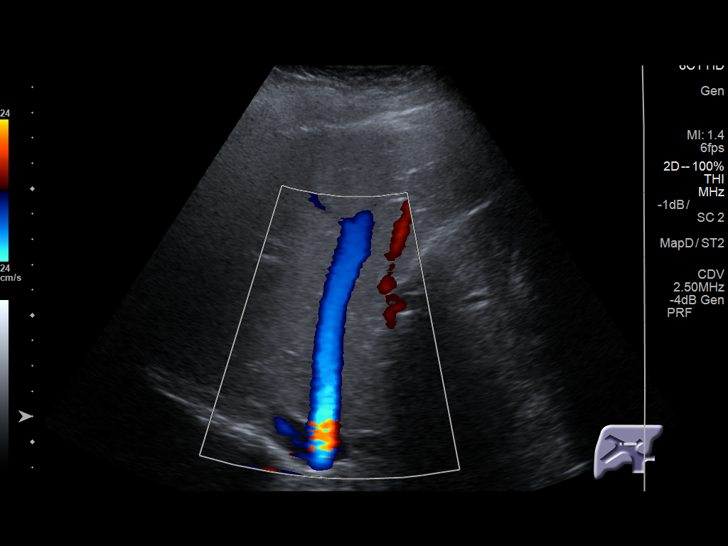
[im 25/28]
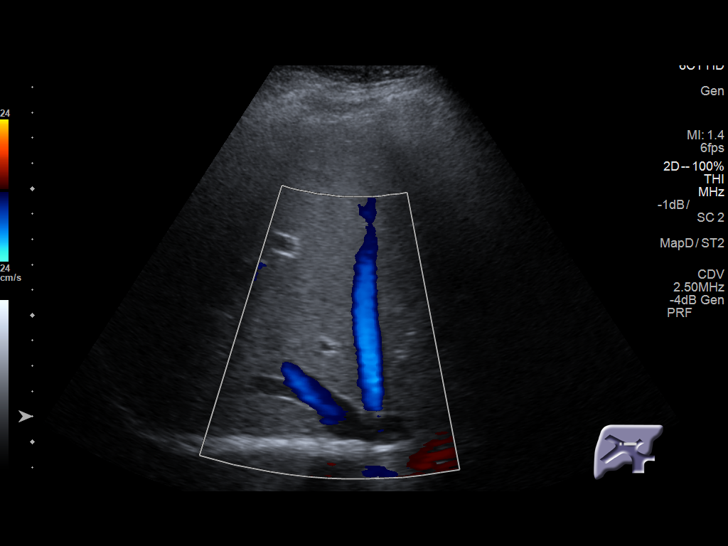
[im 28/28]
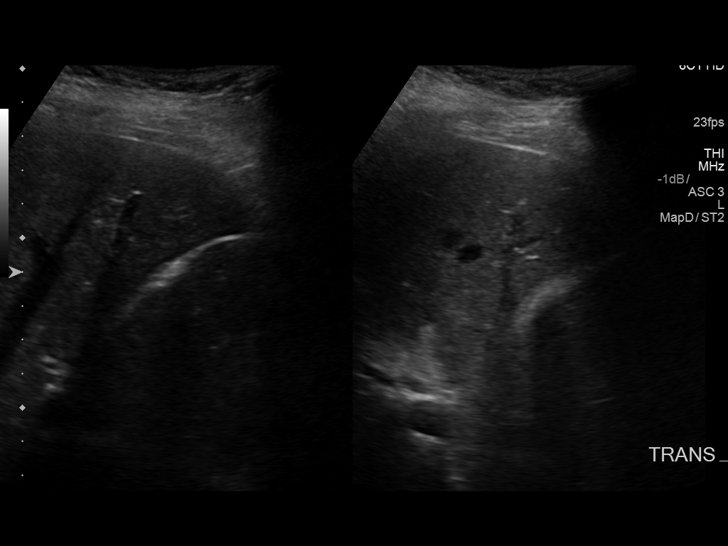

[14 of 25 positions shown; findings below may reference images not displayed]

FINDINGS: Gallbladder:

Interval cholecystectomy. No fluid collection identified in the
cholecystectomy bed.

Common bile duct:

Diameter: 5 mm

Liver:

No focal lesion identified. Within normal limits in parenchymal
echogenicity. Trace perihepatic fluid noted. There is a small
echogenic, non shadowing area in the upper pole the right kidney
which was not previously noted.
IMPRESSION: 1. No biliary dilatation status post interval cholecystectomy.
2. Nonspecific small echogenic focus in the upper pole the right
kidney. This is non shadowing, and not clearly a calculus. No
apparent right-sided hydronephrosis.

## 2016-12-12 ENCOUNTER — Encounter: Payer: BLUE CROSS/BLUE SHIELD | Attending: Physician Assistant | Admitting: Nutrition

## 2016-12-12 ENCOUNTER — Ambulatory Visit (INDEPENDENT_AMBULATORY_CARE_PROVIDER_SITE_OTHER): Payer: BLUE CROSS/BLUE SHIELD | Admitting: Women's Health

## 2016-12-12 ENCOUNTER — Encounter: Payer: Self-pay | Admitting: Women's Health

## 2016-12-12 VITALS — Ht 67.0 in | Wt 211.0 lb

## 2016-12-12 VITALS — BP 120/72 | HR 80 | Wt 210.0 lb

## 2016-12-12 DIAGNOSIS — Z3A00 Weeks of gestation of pregnancy not specified: Secondary | ICD-10-CM | POA: Insufficient documentation

## 2016-12-12 DIAGNOSIS — Z713 Dietary counseling and surveillance: Secondary | ICD-10-CM | POA: Diagnosis not present

## 2016-12-12 DIAGNOSIS — Z3483 Encounter for supervision of other normal pregnancy, third trimester: Secondary | ICD-10-CM

## 2016-12-12 DIAGNOSIS — R8299 Other abnormal findings in urine: Secondary | ICD-10-CM

## 2016-12-12 DIAGNOSIS — Z6833 Body mass index (BMI) 33.0-33.9, adult: Secondary | ICD-10-CM | POA: Diagnosis not present

## 2016-12-12 DIAGNOSIS — O24410 Gestational diabetes mellitus in pregnancy, diet controlled: Secondary | ICD-10-CM

## 2016-12-12 DIAGNOSIS — R82998 Other abnormal findings in urine: Secondary | ICD-10-CM

## 2016-12-12 DIAGNOSIS — Z3A32 32 weeks gestation of pregnancy: Secondary | ICD-10-CM

## 2016-12-12 DIAGNOSIS — Z1389 Encounter for screening for other disorder: Secondary | ICD-10-CM

## 2016-12-12 DIAGNOSIS — Z331 Pregnant state, incidental: Secondary | ICD-10-CM

## 2016-12-12 LAB — POCT URINALYSIS DIPSTICK
Glucose, UA: NEGATIVE
KETONES UA: NEGATIVE
Nitrite, UA: NEGATIVE

## 2016-12-12 NOTE — Progress Notes (Signed)
High Risk Pregnancy Diagnosis(es): A1DM G2P1001 [redacted]w[redacted]d Estimated Date of Delivery: 02/09/17 BP 120/72   Pulse 80   Wt 210 lb (95.3 kg)   LMP 05/11/2016   BMI 32.89 kg/m   Urinalysis: Positive for blood, leuks, tr protein HPI:  fbs 57-96 (only 1>90), 2hr pp 80-115 BP, weight, and urine reviewed.  Reports good fm. Denies regular uc's, lof, vb, uti s/s. No complaints. Denies ha, visual changes, ruq/epigastric pain, n/v.  States pre-e started ~32wks w/ last pregnancy, had c/s @ 37.1wks d/t NRFHR, desires TOLAC- consent signed 11/21/16. Never started baby asa this pregnancy- states she remembers talking about it early pregnancy but didn't recall when she was supposed to start it  Fundal Height:  29 Fetal Heart rate:  131 Edema:  none  Reviewed ptl s/s, fkc, pre-e s/s. Recommended Tdap at HD/PCP per CDC guidelines.  All questions were answered Assessment: [redacted]w[redacted]d A1DM Medication(s) Plans:  None for now Treatment Plan:  Growth u/s @ 38wks     Deliver @ 40wks Follow up in 1wk for high-risk OB appt  Will send urine cx

## 2016-12-12 NOTE — Patient Instructions (Signed)
Call the office [phone]) or go to [facility] if:  You begin to have strong, frequent contractions  Your water breaks.  Sometimes it is a big gush of fluid, sometimes it is just a trickle that keeps getting your panties wet or running down your legs  You have vaginal bleeding.  It is normal to have a small amount of spotting if your cervix was checked.   You don't feel your baby moving like normal.  If you don't, get you something to eat and drink and lay down and focus on feeling your baby move.  You should feel at least 10 movements in 2 hours.  If you don't, you should call the office or go to [facility].   Call the office [phone]) or go to [facility] for these signs of pre-eclampsia:  Severe headache that does not go away with Tylenol  Visual changes- seeing spots, double, blurred vision  Pain under your right breast or upper abdomen that does not go away with Tums or heartburn medicine  Nausea and/or vomiting  Severe swelling in your hands, feet, and face    Tdap Vaccine  It is recommended that you get the Tdap vaccine during the third trimester of EACH pregnancy to help protect your baby from getting pertussis (whooping cough)  27-36 weeks is the BEST time to do this so that you can pass the protection on to your baby. During pregnancy is better than after pregnancy, but if you are unable to get it during pregnancy it will be offered at the hospital.   You can get this vaccine at the health department or your family doctor  Everyone who will be around your baby should also be up-to-date on their vaccines. Adults (who are not pregnant) only need 1 dose of Tdap during adulthood.     Third Trimester of Pregnancy The third trimester is from week 28 through week 40 (months 7 through 9). The third trimester is a time when the unborn baby (fetus) is growing rapidly. At the end of the ninth month, the fetus is about 20 inches in length and weighs 6-10  pounds. Body changes during your third trimester Your body will continue to go through many changes during pregnancy. The changes vary from woman to woman. During the third trimester:  Your weight will continue to increase. You can expect to gain 25-35 pounds (11-16 kg) by the end of the pregnancy.  You may begin to get stretch marks on your hips, abdomen, and breasts.  You may urinate more often because the fetus is moving lower into your pelvis and pressing on your bladder.  You may develop or continue to have heartburn. This is caused by increased hormones that slow down muscles in the digestive tract.  You may develop or continue to have constipation because increased hormones slow digestion and cause the muscles that push waste through your intestines to relax.  You may develop hemorrhoids. These are swollen veins (varicose veins) in the rectum that can itch or be painful.  You may develop swollen, bulging veins (varicose veins) in your legs.  You may have increased body aches in the pelvis, back, or thighs. This is due to weight gain and increased hormones that are relaxing your joints.  You may have changes in your hair. These can include thickening of your hair, rapid growth, and changes in texture. Some women also have hair loss during or after pregnancy, or hair that feels dry or thin. Your hair will  most likely return to normal after your baby is born.  Your breasts will continue to grow and they will continue to become tender. A yellow fluid (colostrum) may leak from your breasts. This is the first milk you are producing for your baby.  Your belly button may stick out.  You may notice more swelling in your hands, face, or ankles.  You may have increased tingling or numbness in your hands, arms, and legs. The skin on your belly may also feel numb.  You may feel short of breath because of your expanding uterus.  You may have more problems sleeping. This can be caused by the  size of your belly, increased need to urinate, and an increase in your body's metabolism.  You may notice the fetus "dropping," or moving lower in your abdomen (lightening).  You may have increased vaginal discharge.  You may notice your joints feel loose and you may have pain around your pelvic bone. What to expect at prenatal visits You will have prenatal exams every 2 weeks until week 36. Then you will have weekly prenatal exams. During a routine prenatal visit:  You will be weighed to make sure you and the baby are growing normally.  Your blood pressure will be taken.  Your abdomen will be measured to track your baby's growth.  The fetal heartbeat will be listened to.  Any test results from the previous visit will be discussed.  You may have a cervical check near your due date to see if your cervix has softened or thinned (effaced).  You will be tested for Group B streptococcus. This happens between 35 and 37 weeks. Your health care provider may ask you:  What your birth plan is.  How you are feeling.  If you are feeling the baby move.  If you have had any abnormal symptoms, such as leaking fluid, bleeding, severe headaches, or abdominal cramping.  If you are using any tobacco products, including cigarettes, chewing tobacco, and electronic cigarettes.  If you have any questions. Other tests or screenings that may be performed during your third trimester include:  Blood tests that check for low iron levels (anemia).  Fetal testing to check the health, activity level, and growth of the fetus. Testing is done if you have certain medical conditions or if there are problems during the pregnancy.  Nonstress test (NST). This test checks the health of your baby to make sure there are no signs of problems, such as the baby not getting enough oxygen. During this test, a belt is placed around your belly. The baby is made to move, and its heart rate is monitored during  movement. What is false labor? False labor is a condition in which you feel small, irregular tightenings of the muscles in the womb (contractions) that usually go away with rest, changing position, or drinking water. These are called Braxton Hicks contractions. Contractions may last for hours, days, or even weeks before true labor sets in. If contractions come at regular intervals, become more frequent, increase in intensity, or become painful, you should see your health care provider. What are the signs of labor?  Abdominal cramps.  Regular contractions that start at 10 minutes apart and become stronger and more frequent with time.  Contractions that start on the top of the uterus and spread down to the lower abdomen and back.  Increased pelvic pressure and dull back pain.  A watery or bloody mucus discharge that comes from the vagina.  Leaking of  amniotic fluid. This is also known as your "water breaking." It could be a slow trickle or a gush. Let your health care provider know if it has a color or strange odor. If you have any of these signs, call your health care provider right away, even if it is before your due date. Follow these instructions at home: Medicines   Follow your health care provider's instructions regarding medicine use. Specific medicines may be either safe or unsafe to take during pregnancy.  Take a prenatal vitamin that contains at least 600 micrograms (mcg) of folic acid.  If you develop constipation, try taking a stool softener if your health care provider approves. Eating and drinking   Eat a balanced diet that includes fresh fruits and vegetables, whole grains, good sources of protein such as meat, eggs, or tofu, and low-fat dairy. Your health care provider will help you determine the amount of weight gain that is right for you.  Avoid raw meat and uncooked cheese. These carry germs that can cause birth defects in the baby.  If you have low calcium intake from  food, talk to your health care provider about whether you should take a daily calcium supplement.  Eat four or five small meals rather than three large meals a day.  Limit foods that are high in fat and processed sugars, such as fried and sweet foods.  To prevent constipation:  Drink enough fluid to keep your urine clear or pale yellow.  Eat foods that are high in fiber, such as fresh fruits and vegetables, whole grains, and beans. Activity   Exercise only as directed by your health care provider. Most women can continue their usual exercise routine during pregnancy. Try to exercise for 30 minutes at least 5 days a week. Stop exercising if you experience uterine contractions.  Avoid heavy lifting.  Do not exercise in extreme heat or humidity, or at high altitudes.  Wear low-heel, comfortable shoes.  Practice good posture.  You may continue to have sex unless your health care provider tells you otherwise. Relieving pain and discomfort   Take frequent breaks and rest with your legs elevated if you have leg cramps or low back pain.  Take warm sitz baths to soothe any pain or discomfort caused by hemorrhoids. Use hemorrhoid cream if your health care provider approves.  Wear a good support bra to prevent discomfort from breast tenderness.  If you develop varicose veins:  Wear support pantyhose or compression stockings as told by your healthcare provider.  Elevate your feet for 15 minutes, 3-4 times a day. Prenatal care   Write down your questions. Take them to your prenatal visits.  Keep all your prenatal visits as told by your health care provider. This is important. Safety   Wear your seat belt at all times when driving.  Make a list of emergency phone numbers, including numbers for family, friends, the hospital, and police and fire departments. General instructions   Avoid cat litter boxes and soil used by cats. These carry germs that can cause birth defects in the  baby. If you have a cat, ask someone to clean the litter box for you.  Do not travel far distances unless it is absolutely necessary and only with the approval of your health care provider.  Do not use hot tubs, steam rooms, or saunas.  Do not drink alcohol.  Do not use any products that contain nicotine or tobacco, such as cigarettes and e-cigarettes. If you need help quitting,  ask your health care provider.  Do not use any medicinal herbs or unprescribed drugs. These chemicals affect the formation and growth of the baby.  Do not douche or use tampons or scented sanitary pads.  Do not cross your legs for long periods of time.  To prepare for the arrival of your baby:  Take prenatal classes to understand, practice, and ask questions about labor and delivery.  Make a trial run to the hospital.  Visit the hospital and tour the maternity area.  Arrange for maternity or paternity leave through employers.  Arrange for family and friends to take care of pets while you are in the hospital.  Purchase a rear-facing car seat and make sure you know how to install it in your car.  Pack your hospital bag.  Prepare the baby's nursery. Make sure to remove all pillows and stuffed animals from the baby's crib to prevent suffocation.  Visit your dentist if you have not gone during your pregnancy. Use a soft toothbrush to brush your teeth and be gentle when you floss. Contact a health care provider if:  You are unsure if you are in labor or if your water has broken.  You become dizzy.  You have mild pelvic cramps, pelvic pressure, or nagging pain in your abdominal area.  You have lower back pain.  You have persistent nausea, vomiting, or diarrhea.  You have an unusual or bad smelling vaginal discharge.  You have pain when you urinate. Get help right away if:  Your water breaks before 37 weeks.  You have regular contractions less than 5 minutes apart before 37 weeks.  You have a  fever.  You are leaking fluid from your vagina.  You have spotting or bleeding from your vagina.  You have severe abdominal pain or cramping.  You have rapid weight loss or weight gain.  You have shortness of breath with chest pain.  You notice sudden or extreme swelling of your face, hands, ankles, feet, or legs.  Your baby makes fewer than 10 movements in 2 hours.  You have severe headaches that do not go away when you take medicine.  You have vision changes. Summary  The third trimester is from week 28 through week 40, months 7 through 9. The third trimester is a time when the unborn baby (fetus) is growing rapidly.  During the third trimester, your discomfort may increase as you and your baby continue to gain weight. You may have abdominal, leg, and back pain, sleeping problems, and an increased need to urinate.  During the third trimester your breasts will keep growing and they will continue to become tender. A yellow fluid (colostrum) may leak from your breasts. This is the first milk you are producing for your baby.  False labor is a condition in which you feel small, irregular tightenings of the muscles in the womb (contractions) that eventually go away. These are called Braxton Hicks contractions. Contractions may last for hours, days, or even weeks before true labor sets in.  Signs of labor can include: abdominal cramps; regular contractions that start at 10 minutes apart and become stronger and more frequent with time; watery or bloody mucus discharge that comes from the vagina; increased pelvic pressure and dull back pain; and leaking of amniotic fluid. This information is not intended to replace advice given to you by your health care provider. Make sure you discuss any questions you have with your health care provider. Document Released: 09/18/2001 Document Revised: 03/01/2016 Document  Reviewed: 11/25/2012 Elsevier Interactive Patient Education  2017 [employer].

## 2016-12-12 NOTE — Progress Notes (Signed)
Diabetes Self-Management Education  Visit Type:    Appt. Start Time: 1100  Appt. End Time: 1130 12/12/2016  [name], identified by name and date of birth, is a 31 y.o. female with a diagnosis of Diabetes:  . EDC 02/09/17  G 2 P 1.   No meds for GDM; FBS 77-96 and 2 hrs after meals 86-111 mg/dl. BS are excellent. Went to [facility] today. Not on baby Asprin since she wasn't told to. No preeclamptic symptoms. No swelling or no headaches.   Following meal plan as discussed. Doing great. Diabetes Self-Management Education  ASSESSMENT  Height 5\' 7"  (1.702 m), weight 211 lb (95.7 kg), last menstrual period 05/11/2016. Body mass index is 33.05 kg/m.       Diabetes Self-Management Education - 12/12/16 1102      Pre-Education Assessment   Patient understands the diabetes disease and treatment process. Demonstrates understanding / competency   Patient understands incorporating nutritional management into lifestyle. Demonstrates understanding / competency   Patient undertands incorporating physical activity into lifestyle. Demonstrates understanding / competency   Patient understands using medications safely. Demonstrates understanding / competency   Patient understands monitoring blood glucose, interpreting and using results Demonstrates understanding / competency   Patient understands prevention, detection, and treatment of acute complications. Demonstrates understanding / competency   Patient understands prevention, detection, and treatment of chronic complications. Demonstrates understanding / competency   Patient understands how to develop strategies to address psychosocial issues. Demonstrates understanding / competency   Patient understands how to develop strategies to promote health/change behavior. Demonstrates understanding / competency     Complications   How often do you check your blood sugar? 3-4 times/day   Fasting Blood glucose range (mg/dL) 70-129   Postprandial Blood  glucose range (mg/dL) 70-129   Number of hypoglycemic episodes per month 0   Number of hyperglycemic episodes per week 0     Exercise   Exercise Type Light (walking / raking leaves)   How many days per week to you exercise? 30   How many minutes per day do you exercise? 5   Total minutes per week of exercise 150     Patient Education   Acute complications Taught treatment of hypoglycemia - the 15 rule.   Preconception care Reviewed with patient blood glucose goals with pregnancy;Role of family planning for patients with diabetes;Pregnancy and GDM  Role of pre-pregnancy blood glucose control on the development of the fetus     Patient Self-Evaluation of Goals - Patient rates self as meeting previously set goals (% of time)   Nutrition >75%   Physical Activity >75%   Medications >75%   Monitoring >75%   Problem Solving >75%   Reducing Risk >75%   Health Coping >75%     Post-Education Assessment   Patient understands the diabetes disease and treatment process. Demonstrates understanding / competency   Patient understands incorporating nutritional management into lifestyle. Demonstrates understanding / competency   Patient undertands incorporating physical activity into lifestyle. Demonstrates understanding / competency   Patient understands using medications safely. Demonstrates understanding / competency   Patient understands monitoring blood glucose, interpreting and using results Demonstrates understanding / competency   Patient understands prevention, detection, and treatment of acute complications. Demonstrates understanding / competency   Patient understands prevention, detection, and treatment of chronic complications. Demonstrates understanding / competency   Patient understands how to develop strategies to address psychosocial issues. Demonstrates understanding / competency   Patient understands how to develop strategies to promote health/change  behavior. Demonstrates  understanding / competency     Outcomes   Program Status Completed      Learning Objective:  Patient will have a greater understanding of diabetes self-management. Patient education plan is to attend individual and/or group sessions per assessed needs and concerns.   Plan:   Keep up the great job! Call your doctor if BS get out of range on regular basis. Keep walking and avoiding processed foods Best of luck! Call if questions.  Expected Outcomes:  Demonstrated interest in learning. Expect positive outcomes  Education material provided: My Plate and Carbohydrate counting sheet  If problems or questions, patient to contact team via:  Phone and Email  Future DSME appointment: - PRN

## 2016-12-12 NOTE — Patient Instructions (Signed)
Keep up the great job! Call your doctor if BS get out of range on regular basis. Keep walking and avoiding processed foods Best of luck! Call if questions.

## 2016-12-14 LAB — URINE CULTURE

## 2016-12-19 ENCOUNTER — Ambulatory Visit (INDEPENDENT_AMBULATORY_CARE_PROVIDER_SITE_OTHER): Payer: BLUE CROSS/BLUE SHIELD | Admitting: Obstetrics and Gynecology

## 2016-12-19 ENCOUNTER — Encounter: Payer: Self-pay | Admitting: Obstetrics and Gynecology

## 2016-12-19 VITALS — BP 112/70 | HR 103 | Wt 216.0 lb

## 2016-12-19 DIAGNOSIS — O0993 Supervision of high risk pregnancy, unspecified, third trimester: Secondary | ICD-10-CM

## 2016-12-19 DIAGNOSIS — Z3A33 33 weeks gestation of pregnancy: Secondary | ICD-10-CM

## 2016-12-19 DIAGNOSIS — Z3483 Encounter for supervision of other normal pregnancy, third trimester: Secondary | ICD-10-CM

## 2016-12-19 DIAGNOSIS — Z331 Pregnant state, incidental: Secondary | ICD-10-CM

## 2016-12-19 DIAGNOSIS — Z1389 Encounter for screening for other disorder: Secondary | ICD-10-CM

## 2016-12-19 DIAGNOSIS — O24410 Gestational diabetes mellitus in pregnancy, diet controlled: Secondary | ICD-10-CM

## 2016-12-19 LAB — POCT URINALYSIS DIPSTICK
GLUCOSE UA: NEGATIVE
Ketones, UA: NEGATIVE
Leukocytes, UA: NEGATIVE
NITRITE UA: NEGATIVE
Protein, UA: NEGATIVE
RBC UA: NEGATIVE

## 2016-12-19 NOTE — Progress Notes (Signed)
Patient ID: Allison [name], female   DOB: 07-27-1986, 31 y.o.   MRN: [id number]  High Risk Pregnancy Diagnosis(es):   A1DM, pre-eclampsia  G2P1001 [redacted]w[redacted]d Estimated Date of Delivery: 02/09/17  Blood pressure 112/70, pulse (!) 103, weight 216 lb (98 kg), last menstrual period 05/11/2016.  Urinalysis: Negative   HPI: The patient is being seen today for ongoing management of A1DM, pre-eclampsia. Today she reports that she keeps track of her blood sugars and that they have been trending up slightly recently. She states that she ambulates 20-30 minutes daily. 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 She hasn't tried any medications for her symptoms. Denies any other symptoms.    BP weight and urine results all reviewed and noted. Patient reports good fetal movement, denies any bleeding and no rupture of membranes symptoms or regular contractions.  Discussion: Discussed with pt weight loss methods including food measurement and calorie counting using apps such as MyNetDiary or My Fitness Pal. Recommended the use of measuring cups and spoons to monitor serving sizes. Encouraged adequate daily water intake, especially prior to meals to eliminate overeating. Additionally encouraged pt to become more active by taking daily walks of at least 30 minute duration, join a local gym such as the [facility] or attend water aerobics classes. Also advised pt to use pedometer on smartphone or utilize a smartband such as FitBit to keep track of daily activity.   At end of discussion, pt had opportunity to ask questions and has no further questions at this time.   Specific discussion of lifestyle changes, behavioral modifications and healthy eating habits as noted above. Greater than 50% was spent in counseling and coordination of care with the patient.  Total time greater than: 25 minutes.   Patient is without complaints other than noted in her HPI. All questions were answered.  All  lab and sonogram results have been reviewed. Comments: normal    **CBG and diet control**  Assessment:  1.  Pregnancy at [redacted]w[redacted]d,  Estimated Date of Delivery: 02/09/17 :                        2.  A1DM                        3.  Pre-eclampsia  Medication(s) Plans:   Treatment Plan: follow up in 2 weeks  No Follow-up on file. for appointment for high risk OB care  No orders of the defined types were placed in this encounter.  Orders Placed This Encounter  Procedures  . POCT urinalysis dipstick   By signing my name below, I, Soijett Blue, attest that this documentation has been prepared under the direction and in the presence of Jonnie Kind, MD. Electronically Signed: [place], ED Scribe. 12/19/16. 10:10 AM.    I personally performed the services described in this documentation, which was SCRIBED in my presence. The recorded information has been reviewed and considered accurate. It has been edited as necessary during review. Jonnie Kind, MD

## 2017-01-02 ENCOUNTER — Encounter: Payer: Self-pay | Admitting: Obstetrics and Gynecology

## 2017-01-02 ENCOUNTER — Ambulatory Visit (INDEPENDENT_AMBULATORY_CARE_PROVIDER_SITE_OTHER): Payer: BLUE CROSS/BLUE SHIELD | Admitting: Obstetrics and Gynecology

## 2017-01-02 VITALS — BP 110/72 | HR 91 | Wt 216.3 lb

## 2017-01-02 DIAGNOSIS — Z331 Pregnant state, incidental: Secondary | ICD-10-CM

## 2017-01-02 DIAGNOSIS — Z1389 Encounter for screening for other disorder: Secondary | ICD-10-CM

## 2017-01-02 DIAGNOSIS — Z3483 Encounter for supervision of other normal pregnancy, third trimester: Secondary | ICD-10-CM

## 2017-01-02 LAB — POCT URINALYSIS DIPSTICK
Glucose, UA: NEGATIVE
Ketones, UA: NEGATIVE
Leukocytes, UA: NEGATIVE
Nitrite, UA: NEGATIVE
PROTEIN UA: NEGATIVE
RBC UA: NEGATIVE

## 2017-01-02 NOTE — Progress Notes (Addendum)
Patient ID: Allison [name], female   DOB: May 22, 1986, 31 y.o.   MRN: [id number] Fetal Surveillance Testing today:    High Risk Pregnancy Diagnosis(es):   A1DM, Hx of pre-eclampsia at 37 weeks, prior C/S desiring TOLAC, hx gastric bypass  G2P1001 [redacted]w[redacted]d Estimated Date of Delivery: 02/09/17  Blood pressure 110/72, pulse 91, weight 216 lb 4.8 oz (98.1 kg), last menstrual period 05/11/2016.  Urinalysis: Negative   HPI: The patient is being seen today for ongoing management of A1DM, pre-eclampsia. Pt notes that her highest blood sugar was elevated to 160's, but she has had nl blood sugars otherwise. Pt states that she was induced 3 weeks early with a C-section due to her child's HR dropping and pre-eclampsia. Pt reports associated back spasms x 1 week. Has not tried any medications for her symptoms. Denies any other symptoms. She notes that she had a gastric bypass 1 year prior to her pregnancy. Pt states that she doesn't have any plans for contraception following pregnancy.   BP weight and urine results all reviewed and noted. Patient reports good fetal movement, denies any bleeding and no rupture of membranes symptoms or regular contractions.  Fundal Height:  32 cm Fetal Heart rate:  146 Edema:  none Urine : neg protein/ glucose   questions were answered.  All lab  have been reviewed. Comments: pt did NOT bring CBG records today  Assessment:  1.  Pregnancy at [redacted]w[redacted]d,  Estimated Date of Delivery: 02/09/17 :                         2.  A1DM- didn't briing results. ?compliance.                        3.  Hx of preeclampsia                        4/ Bacl spasm, will tx with exercise for now, consider flexeril 5 if it persists. Medication(s) Plans: diet control for now  Treatment Plan: follow up in 2 weeks for routine HROB.                            Flexeril Rx PRN  No Follow-up on file. for appointment for high risk OB care 2 wk No orders of the defined types were placed in this  encounter.  Orders Placed This Encounter  Procedures  . POCT urinalysis dipstick   By signing my name below, I, Soijett Blue, attest that this documentation has been prepared under the direction and in the presence of Jonnie Kind, MD. Electronically Signed: [place], ED Scribe. 01/02/17. 12:14 PM.   I personally performed the services described in this documentation, which was SCRIBED in my presence. The recorded information has been reviewed and considered accurate. It has been edited as necessary during review. Jonnie Kind, MD

## 2017-01-16 ENCOUNTER — Ambulatory Visit (INDEPENDENT_AMBULATORY_CARE_PROVIDER_SITE_OTHER): Payer: BLUE CROSS/BLUE SHIELD | Admitting: Obstetrics and Gynecology

## 2017-01-16 ENCOUNTER — Encounter: Payer: Self-pay | Admitting: Obstetrics and Gynecology

## 2017-01-16 VITALS — BP 100/62 | HR 82 | Wt 220.4 lb

## 2017-01-16 DIAGNOSIS — Z331 Pregnant state, incidental: Secondary | ICD-10-CM

## 2017-01-16 DIAGNOSIS — Z3483 Encounter for supervision of other normal pregnancy, third trimester: Secondary | ICD-10-CM

## 2017-01-16 DIAGNOSIS — Z3A36 36 weeks gestation of pregnancy: Secondary | ICD-10-CM

## 2017-01-16 DIAGNOSIS — Z1389 Encounter for screening for other disorder: Secondary | ICD-10-CM

## 2017-01-16 LAB — POCT URINALYSIS DIPSTICK
GLUCOSE UA: NEGATIVE
Ketones, UA: NEGATIVE
Leukocytes, UA: NEGATIVE
NITRITE UA: NEGATIVE
PROTEIN UA: NEGATIVE

## 2017-01-16 NOTE — Progress Notes (Addendum)
High Risk Pregnancy HROB Diagnosis(es):   A1DM, Hx of pre-eclampsia at 37 weeks, prior C/S desiring TOLAC, hx gastric bypass  G2P1001 [redacted]w[redacted]d Estimated Date of Delivery: 02/09/17    HPI: The patient is being seen today for ongoing management of above.  Today she has no major complaints. CBGs: last 10 fasting only 1 has been abnormal. All morning fastings are in 80 to low 90's except 136. Post-prandials are good; had 1 abnormal out of about 30 measurements.   Patient reports good fetal movement, denies any bleeding and no rupture of membranes symptoms or regular contractions.   BP weight and urine results reviewed and noted. Blood pressure 100/62, pulse 82, weight 220 lb 6.4 oz (100 kg), last menstrual period 05/11/2016.  Fundal Height:  32 cm Fetal Heart rate:  125 Physical Examination: Abdomen - soft, nontender, nondistended, no masses or organomegaly                                     Pelvic - group B strep collected, otherwise normal                                      Edema:  trace  Urinalysis:Positive for 2+ blood will send u/a reflex C&S  Fetal Surveillance Testing today: none   Lab and sonogram results have been reviewed. Comments:   Assessment:  1.  Pregnancy at [redacted]w[redacted]d,  G2P1001   :  Estimated Date of Delivery: 02/09/17                        2.  A1DM                        3. History of pre-eclampsia   Medication(s) Plans:  Diet control for now  Treatment Plan:  Follow up in 1 week for routine HROB will need [place] FOR WEIGHT at 38wk  Follow up in 1 week for appointment for high risk OB care, HROB  By signing my name below, I, Sonum Patel, attest that this documentation has been prepared under the direction and in the presence of Jonnie Kind, MD. Electronically Signed: Sonum Patel, Education administrator. 01/16/17. 2:04 PM.  I personally performed the services described in this documentation, which was SCRIBED in my presence. The recorded information has been reviewed and considered  accurate. It has been edited as necessary during review. Jonnie Kind, MD

## 2017-01-18 LAB — GC/CHLAMYDIA PROBE AMP
CHLAMYDIA, DNA PROBE: NEGATIVE
Neisseria gonorrhoeae by PCR: NEGATIVE

## 2017-01-18 LAB — STREP GP B NAA: STREP GROUP B AG: NEGATIVE

## 2017-01-23 ENCOUNTER — Encounter: Payer: Self-pay | Admitting: Obstetrics and Gynecology

## 2017-01-23 ENCOUNTER — Ambulatory Visit (INDEPENDENT_AMBULATORY_CARE_PROVIDER_SITE_OTHER): Payer: BLUE CROSS/BLUE SHIELD | Admitting: Obstetrics and Gynecology

## 2017-01-23 VITALS — BP 138/90 | HR 72 | Wt 221.0 lb

## 2017-01-23 DIAGNOSIS — O0943 Supervision of pregnancy with grand multiparity, third trimester: Secondary | ICD-10-CM

## 2017-01-23 DIAGNOSIS — Z3483 Encounter for supervision of other normal pregnancy, third trimester: Secondary | ICD-10-CM

## 2017-01-23 DIAGNOSIS — Z331 Pregnant state, incidental: Secondary | ICD-10-CM

## 2017-01-23 DIAGNOSIS — O139 Gestational [pregnancy-induced] hypertension without significant proteinuria, unspecified trimester: Secondary | ICD-10-CM

## 2017-01-23 DIAGNOSIS — Z1389 Encounter for screening for other disorder: Secondary | ICD-10-CM

## 2017-01-23 DIAGNOSIS — O133 Gestational [pregnancy-induced] hypertension without significant proteinuria, third trimester: Secondary | ICD-10-CM

## 2017-01-23 LAB — POCT URINALYSIS DIPSTICK
Glucose, UA: NEGATIVE
Ketones, UA: NEGATIVE
Leukocytes, UA: NEGATIVE
NITRITE UA: NEGATIVE
Protein, UA: NEGATIVE

## 2017-01-23 NOTE — Progress Notes (Signed)
High Risk Pregnancy HROB Diagnosis(es):   A1DM, Hx of pre-eclampsia at 37 weeks, prior C/S desiring TOLAC, hx gastric bypass  G2P1001 [redacted]w[redacted]d Estimated Date of Delivery: 02/09/17    HPI: The patient is being seen today for ongoing management of above. Today she has no complaints. She denies HAs Patient reports good fetal movement, denies any bleeding and no rupture of membranes symptoms or regular contractions.    BP weight and urine results reviewed and noted. Blood pressure 138/90, pulse 72, weight 221 lb (100.2 kg), last menstrual period 05/11/2016.  Fundal Height:  36 cm Fetal Heart rate:  159 Physical Examination: Abdomen - soft, nontender, nondistended, no masses or organomegaly                                     Pelvic - 1 cm                                     Edema:  none  Urinalysis:Positive for 3+ blood  Fetal Surveillance Testing today:  none  Lab and sonogram results have been reviewed. Comments:   Assessment:  1.  Pregnancy at [redacted]w[redacted]d,  G2P1001   :  Estimated Date of Delivery: 02/09/17                         2.  A1DM,                         3. Hx of pre-eclampsia at 37 week  Medication(s) Plans:  Diet controlled for now  Treatment Plan:  NST next visit  Follow up in 1days for BP recheckappointment for high risk OB care, HROB iol if persistent bp elevation Cbc,cmp ordered today.also Pr/Cr ratio By signing my name below, I, Sonum Patel, attest that this documentation has been prepared under the direction and in the presence of Jonnie Kind, MD. Electronically Signed: Sonum Patel, Education administrator. 01/23/17. 1:57 PM.  I personally performed the services described in this documentation, which was SCRIBED in my presence. The recorded information has been reviewed and considered accurate. It has been edited as necessary during review. Jonnie Kind, MD

## 2017-01-24 ENCOUNTER — Encounter: Payer: BLUE CROSS/BLUE SHIELD | Admitting: Obstetrics and Gynecology

## 2017-01-24 ENCOUNTER — Encounter: Payer: Self-pay | Admitting: Obstetrics and Gynecology

## 2017-01-24 ENCOUNTER — Encounter (HOSPITAL_COMMUNITY): Payer: Self-pay | Admitting: *Deleted

## 2017-01-24 ENCOUNTER — Other Ambulatory Visit: Payer: Self-pay | Admitting: Obstetrics and Gynecology

## 2017-01-24 ENCOUNTER — Inpatient Hospital Stay (HOSPITAL_COMMUNITY): Payer: BLUE CROSS/BLUE SHIELD | Admitting: Anesthesiology

## 2017-01-24 ENCOUNTER — Inpatient Hospital Stay (HOSPITAL_COMMUNITY)
Admission: AD | Admit: 2017-01-24 | Discharge: 2017-01-27 | DRG: 775 | Disposition: A | Discharge status: Home / Self Care | Payer: BLUE CROSS/BLUE SHIELD | Source: Ambulatory Visit | Attending: Family Medicine | Admitting: Family Medicine

## 2017-01-24 ENCOUNTER — Ambulatory Visit (INDEPENDENT_AMBULATORY_CARE_PROVIDER_SITE_OTHER): Payer: BLUE CROSS/BLUE SHIELD | Admitting: Obstetrics and Gynecology

## 2017-01-24 VITALS — BP 150/100 | HR 112 | Wt 222.0 lb

## 2017-01-24 DIAGNOSIS — O24420 Gestational diabetes mellitus in childbirth, diet controlled: Secondary | ICD-10-CM | POA: Diagnosis present

## 2017-01-24 DIAGNOSIS — I10 Essential (primary) hypertension: Secondary | ICD-10-CM | POA: Diagnosis present

## 2017-01-24 DIAGNOSIS — O99214 Obesity complicating childbirth: Secondary | ICD-10-CM | POA: Diagnosis present

## 2017-01-24 DIAGNOSIS — Z1389 Encounter for screening for other disorder: Secondary | ICD-10-CM

## 2017-01-24 DIAGNOSIS — O134 Gestational [pregnancy-induced] hypertension without significant proteinuria, complicating childbirth: Secondary | ICD-10-CM | POA: Diagnosis present

## 2017-01-24 DIAGNOSIS — O99344 Other mental disorders complicating childbirth: Secondary | ICD-10-CM | POA: Diagnosis present

## 2017-01-24 DIAGNOSIS — O34211 Maternal care for low transverse scar from previous cesarean delivery: Secondary | ICD-10-CM | POA: Diagnosis present

## 2017-01-24 DIAGNOSIS — Z3483 Encounter for supervision of other normal pregnancy, third trimester: Secondary | ICD-10-CM | POA: Diagnosis not present

## 2017-01-24 DIAGNOSIS — Z6834 Body mass index (BMI) 34.0-34.9, adult: Secondary | ICD-10-CM

## 2017-01-24 DIAGNOSIS — Z3A37 37 weeks gestation of pregnancy: Secondary | ICD-10-CM | POA: Diagnosis not present

## 2017-01-24 DIAGNOSIS — O133 Gestational [pregnancy-induced] hypertension without significant proteinuria, third trimester: Secondary | ICD-10-CM

## 2017-01-24 DIAGNOSIS — Z87891 Personal history of nicotine dependence: Secondary | ICD-10-CM | POA: Diagnosis not present

## 2017-01-24 DIAGNOSIS — O24419 Gestational diabetes mellitus in pregnancy, unspecified control: Secondary | ICD-10-CM

## 2017-01-24 DIAGNOSIS — Z331 Pregnant state, incidental: Secondary | ICD-10-CM

## 2017-01-24 DIAGNOSIS — O34219 Maternal care for unspecified type scar from previous cesarean delivery: Secondary | ICD-10-CM

## 2017-01-24 DIAGNOSIS — O24410 Gestational diabetes mellitus in pregnancy, diet controlled: Secondary | ICD-10-CM

## 2017-01-24 DIAGNOSIS — Z8249 Family history of ischemic heart disease and other diseases of the circulatory system: Secondary | ICD-10-CM

## 2017-01-24 DIAGNOSIS — F419 Anxiety disorder, unspecified: Secondary | ICD-10-CM | POA: Diagnosis present

## 2017-01-24 DIAGNOSIS — O09299 Supervision of pregnancy with other poor reproductive or obstetric history, unspecified trimester: Secondary | ICD-10-CM

## 2017-01-24 DIAGNOSIS — O24429 Gestational diabetes mellitus in childbirth, unspecified control: Secondary | ICD-10-CM | POA: Diagnosis not present

## 2017-01-24 DIAGNOSIS — J45909 Unspecified asthma, uncomplicated: Secondary | ICD-10-CM | POA: Diagnosis present

## 2017-01-24 DIAGNOSIS — O9952 Diseases of the respiratory system complicating childbirth: Secondary | ICD-10-CM | POA: Diagnosis present

## 2017-01-24 LAB — COMPREHENSIVE METABOLIC PANEL
A/G RATIO: 1.5 (ref 1.2–2.2)
ALK PHOS: 90 U/L (ref 38–126)
ALT: 5 IU/L (ref 0–32)
ALT: 9 U/L — AB (ref 14–54)
AST: 12 IU/L (ref 0–40)
AST: 13 U/L — AB (ref 15–41)
Albumin: 3.9 g/dL (ref 3.5–5.5)
Albumin: 3.2 g/dL — ABNORMAL LOW (ref 3.5–5.0)
Alkaline Phosphatase: 98 IU/L (ref 39–117)
Anion gap: 11 (ref 5–15)
BILIRUBIN TOTAL: 0.3 mg/dL (ref 0.0–1.2)
BUN/Creatinine Ratio: 29 — ABNORMAL HIGH (ref 9–23)
BUN: 13 mg/dL (ref 6–20)
BUN: 14 mg/dL (ref 6–20)
CALCIUM: 9 mg/dL (ref 8.7–10.2)
CALCIUM: 8.8 mg/dL — AB (ref 8.9–10.3)
CO2: 17 mmol/L — ABNORMAL LOW (ref 18–29)
CO2: 19 mmol/L — AB (ref 22–32)
CREATININE: 0.45 mg/dL — AB (ref 0.57–1.00)
CREATININE: 0.47 mg/dL (ref 0.44–1.00)
Chloride: 98 mmol/L (ref 96–106)
Chloride: 105 mmol/L (ref 101–111)
GFR calc Af Amer: >60 mL/min (ref >60)
GFR calc non Af Amer: >60 mL/min (ref >60)
GFR, EST AFRICAN AMERICAN: 154 mL/min/{1.73_m2} (ref >59)
GFR, EST NON AFRICAN AMERICAN: 134 mL/min/{1.73_m2} (ref >59)
Globulin, Total: 2.6 g/dL (ref 1.5–4.5)
Glucose, Bld: 75 mg/dL (ref 65–99)
Glucose: 78 mg/dL (ref 65–99)
POTASSIUM: 4.5 mmol/L (ref 3.5–5.2)
Potassium: 4.3 mmol/L (ref 3.5–5.1)
SODIUM: 135 mmol/L (ref 135–145)
Sodium: 135 mmol/L (ref 134–144)
Total Bilirubin: 0.5 mg/dL (ref 0.3–1.2)
Total Protein: 6.5 g/dL (ref 6.0–8.5)
Total Protein: 6.5 g/dL (ref 6.5–8.1)

## 2017-01-24 LAB — CBC
HEMATOCRIT: 39.7 % (ref 36.0–46.0)
Hematocrit: 41.1 % (ref 34.0–46.6)
Hemoglobin: 13.6 g/dL (ref 11.1–15.9)
Hemoglobin: 13.5 g/dL (ref 12.0–15.0)
MCH: 29.2 pg (ref 26.6–33.0)
MCH: 29.8 pg (ref 26.0–34.0)
MCHC: 33.1 g/dL (ref 31.5–35.7)
MCHC: 34 g/dL (ref 30.0–36.0)
MCV: 88 fL (ref 79–97)
MCV: 87.6 fL (ref 78.0–100.0)
PLATELETS: 196 10*3/uL (ref 150–379)
PLATELETS: 193 10*3/uL (ref 150–400)
RBC: 4.66 x10E6/uL (ref 3.77–5.28)
RBC: 4.53 MIL/uL (ref 3.87–5.11)
RDW: 13.4 % (ref 12.3–15.4)
RDW: 14 % (ref 11.5–15.5)
WBC: 10.8 10*3/uL (ref 3.4–10.8)
WBC: 11.9 10*3/uL — ABNORMAL HIGH (ref 4.0–10.5)

## 2017-01-24 LAB — GLUCOSE, CAPILLARY
GLUCOSE-CAPILLARY: 86 mg/dL (ref 65–99)
GLUCOSE-CAPILLARY: 73 mg/dL (ref 65–99)

## 2017-01-24 LAB — POCT URINALYSIS DIPSTICK
Glucose, UA: NEGATIVE
Ketones, UA: NEGATIVE
LEUKOCYTES UA: NEGATIVE
NITRITE UA: NEGATIVE

## 2017-01-24 LAB — TYPE AND SCREEN
ABO/RH(D): A POS
Antibody Screen: NEGATIVE

## 2017-01-24 LAB — PROTEIN / CREATININE RATIO, URINE
Creatinine, Urine: 63.6 mg/dL
Creatinine, Urine: 223 mg/dL
PROTEIN UR: 8.5 mg/dL
PROTEIN/CREAT RATIO: 134 mg/g{creat} (ref 0–200)
Protein Creatinine Ratio: 0.09 mg/mg{Cre} (ref 0.00–0.15)
TOTAL PROTEIN, URINE: 20 mg/dL

## 2017-01-24 MED ORDER — PHENYLEPHRINE 40 MCG/ML (10ML) SYRINGE FOR IV PUSH (FOR BLOOD PRESSURE SUPPORT): 80.0000 ug | PREFILLED_SYRINGE | INTRAVENOUS | Status: DC | PRN

## 2017-01-24 MED ORDER — LIDOCAINE HCL (PF) 1 % IJ SOLN
30.0000 mL | INTRAMUSCULAR | Status: DC | PRN
  Filled 2017-01-24: qty 30

## 2017-01-24 MED ORDER — OXYTOCIN 40 UNITS IN LACTATED RINGERS INFUSION - SIMPLE MED
1.0000 m[IU]/min | INTRAVENOUS | Status: DC
  Administered 2017-01-24: 1 m[IU]/min via INTRAVENOUS
  Filled 2017-01-24: qty 1000

## 2017-01-24 MED ORDER — EPHEDRINE 5 MG/ML INJ: 10.0000 mg | INTRAVENOUS | Status: DC | PRN

## 2017-01-24 MED ORDER — LACTATED RINGERS IV SOLN: 500.0000 mL | Freq: Once | INTRAVENOUS | Status: DC

## 2017-01-24 MED ORDER — ACETAMINOPHEN 325 MG PO TABS: 650.0000 mg | ORAL_TABLET | ORAL | Status: DC | PRN

## 2017-01-24 MED ORDER — TERBUTALINE SULFATE 1 MG/ML IJ SOLN
0.2500 mg | Freq: Once | INTRAMUSCULAR | Status: AC | PRN
  Administered 2017-01-25: 0.25 mg via SUBCUTANEOUS
  Filled 2017-01-24: qty 1

## 2017-01-24 MED ORDER — OXYTOCIN 40 UNITS IN LACTATED RINGERS INFUSION - SIMPLE MED: 2.5000 [IU]/h | INTRAVENOUS | Status: DC

## 2017-01-24 MED ORDER — OXYCODONE-ACETAMINOPHEN 5-325 MG PO TABS: 2.0000 | ORAL_TABLET | ORAL | Status: DC | PRN

## 2017-01-24 MED ORDER — EPHEDRINE 5 MG/ML INJ
10.0000 mg | INTRAVENOUS | Status: DC | PRN
Start: 2017-01-24 — End: 2017-01-24

## 2017-01-24 MED ORDER — OXYCODONE-ACETAMINOPHEN 5-325 MG PO TABS: 1.0000 | ORAL_TABLET | ORAL | Status: DC | PRN

## 2017-01-24 MED ORDER — PHENYLEPHRINE 40 MCG/ML (10ML) SYRINGE FOR IV PUSH (FOR BLOOD PRESSURE SUPPORT)
80.0000 ug | PREFILLED_SYRINGE | INTRAVENOUS | Status: DC | PRN
Start: 2017-01-24 — End: 2017-01-26
  Filled 2017-01-24: qty 5
  Filled 2017-01-24: qty 10

## 2017-01-24 MED ORDER — OXYTOCIN 40 UNITS IN LACTATED RINGERS INFUSION - SIMPLE MED
1.0000 m[IU]/min | INTRAVENOUS | Status: DC
Start: 2017-01-24 — End: 2017-01-25

## 2017-01-24 MED ORDER — EPHEDRINE 5 MG/ML INJ
10.0000 mg | INTRAVENOUS | Status: DC | PRN
  Filled 2017-01-24: qty 2

## 2017-01-24 MED ORDER — LACTATED RINGERS IV SOLN: 500.0000 mL | INTRAVENOUS | Status: DC | PRN

## 2017-01-24 MED ORDER — PHENYLEPHRINE 40 MCG/ML (10ML) SYRINGE FOR IV PUSH (FOR BLOOD PRESSURE SUPPORT)
80.0000 ug | PREFILLED_SYRINGE | INTRAVENOUS | Status: DC | PRN
Start: 2017-01-24 — End: 2017-01-24

## 2017-01-24 MED ORDER — LACTATED RINGERS IV SOLN
INTRAVENOUS | Status: DC
  Administered 2017-01-24 – 2017-01-25 (×5): via INTRAVENOUS

## 2017-01-24 MED ORDER — FENTANYL 2.5 MCG/ML BUPIVACAINE 1/10 % EPIDURAL INFUSION (WH - ANES)
14.0000 mL/h | INTRAMUSCULAR | Status: DC | PRN
  Administered 2017-01-24 – 2017-01-25 (×4): 14 mL/h via EPIDURAL
  Filled 2017-01-24 (×4): qty 100

## 2017-01-24 MED ORDER — LIDOCAINE HCL (PF) 1 % IJ SOLN
INTRAMUSCULAR | Status: DC | PRN
  Administered 2017-01-24 (×2): 6 mL via EPIDURAL

## 2017-01-24 MED ORDER — DIPHENHYDRAMINE HCL 50 MG/ML IJ SOLN
12.5000 mg | INTRAMUSCULAR | Status: DC | PRN
  Administered 2017-01-24: 12.5 mg via INTRAVENOUS
  Filled 2017-01-24: qty 1

## 2017-01-24 MED ORDER — PHENYLEPHRINE 40 MCG/ML (10ML) SYRINGE FOR IV PUSH (FOR BLOOD PRESSURE SUPPORT)
80.0000 ug | PREFILLED_SYRINGE | INTRAVENOUS | Status: DC | PRN
  Filled 2017-01-24: qty 5

## 2017-01-24 MED ORDER — OXYTOCIN BOLUS FROM INFUSION: 500.0000 mL | Freq: Once | INTRAVENOUS | Status: DC

## 2017-01-24 MED ORDER — SOD CITRATE-CITRIC ACID 500-334 MG/5ML PO SOLN
30.0000 mL | ORAL | Status: DC | PRN
  Filled 2017-01-24 (×2): qty 15

## 2017-01-24 MED ORDER — FENTANYL CITRATE (PF) 100 MCG/2ML IJ SOLN: 100.0000 ug | INTRAMUSCULAR | Status: DC | PRN

## 2017-01-24 MED ORDER — ONDANSETRON HCL 4 MG/2ML IJ SOLN: 4.0000 mg | Freq: Four times a day (QID) | INTRAMUSCULAR | Status: DC | PRN

## 2017-01-24 MED ORDER — DIPHENHYDRAMINE HCL 50 MG/ML IJ SOLN: 12.5000 mg | INTRAMUSCULAR | Status: DC | PRN

## 2017-01-24 MED ORDER — LACTATED RINGERS IV SOLN
500.0000 mL | Freq: Once | INTRAVENOUS | Status: AC
  Administered 2017-01-24: 500 mL via INTRAVENOUS

## 2017-01-24 NOTE — Progress Notes (Signed)
High Risk Pregnancy HROB Diagnosis(es):   Gestational hypertension, return visit at [redacted]w[redacted]d Previous cesarean section desiring TOLAC A1 estationaldiabetes with good control G2P1001 [redacted]w[redacted]d Estimated Date of Delivery: 02/09/17    PJS:RPRXYV-OP from visit yesterday 01/23/2017 where patient found to have elevated blood pressures for the first time this pregnancy. PIH labs were  Normal with protein creatinine ratio until this afternoo 0.134.  The patient is being seen today for ongoing management of as above. Today she reports no headache scotomata right upper quadrant pain or vision changes Patient reports good fetal movement, denies any bleeding and no rupture of membranes symptoms or regular contractions.   BP weight and urine results reviewed and noted. Blood pressure (!) 150/100, pulse (!) 112, weight 222 lb (100.7 kg), last menstrual period 05/11/2016.  Fundal Height:  8 cm Fetal Heart rate:   Physical Examination: Abdomen - examined yesterday                                     Pelvic - examined yesterday                                     Edema:    Urinalysis:NEGATIVE for                  POSITIVE for trace protein  Fetal Surveillance Testing today:  None will be admitted  Lab results have been reviewed. PIH labs and negative protein creatinine ratio has returned this afternoon and is negative Comments:    Assessment:  1   Pregnancy at [redacted]w[redacted]d,  G2P1001   :  Gestational hypertension                        2.  A1 diabetes mellitus, stable on diet control                        3. Prior cesarean section desiring Tolac  Medication(s) Plans:  nonoe  Treatment Plan:  Admit for IOL  Follow up in 1  weeks for appointment for high risk OB care, BP check

## 2017-01-24 NOTE — Anesthesia Preprocedure Evaluation (Signed)
Anesthesia Evaluation  Patient identified by MRN, date of birth, ID band Patient awake    Reviewed: Allergy & Precautions, NPO status , Patient's Chart, lab work & pertinent test results  History of Anesthesia Complications (+) PONV  Airway Mallampati: III  TM Distance: >3 FB Neck ROM: Full    Dental  (+) Dental Advisory Given   Pulmonary asthma (last inhaler needed last night) , former smoker,    breath sounds clear to auscultation       Cardiovascular hypertension (PIH),  Rhythm:Regular Rate:Normal     Neuro/Psych    GI/Hepatic Neg liver ROS, s/p gastric sleeve   Endo/Other  diabetes (glu 86), GestationalMorbid obesity  Renal/GU negative Renal ROS     Musculoskeletal   Abdominal (+) + obese,   Peds  Hematology plt 193k   Anesthesia Other Findings   Reproductive/Obstetrics (+) Pregnancy                             Anesthesia Physical Anesthesia Plan  ASA: III  Anesthesia Plan: Epidural   Post-op Pain Management:    Induction:   Airway Management Planned: Natural Airway  Additional Equipment:   Intra-op Plan:   Post-operative Plan:   Informed Consent: I have reviewed the patients History and Physical, chart, labs and discussed the procedure including the risks, benefits and alternatives for the proposed anesthesia with the patient or authorized representative who has indicated his/her understanding and acceptance.   Dental advisory given  Plan Discussed with:   Anesthesia Plan Comments: (Patient identified. Risks/Benefits/Options discussed with patient including but not limited to bleeding, infection, nerve damage, paralysis, failed block, incomplete pain control, headache, blood pressure changes, nausea, vomiting, reactions to medication both or allergic, itching and postpartum back pain. Confirmed with bedside nurse the patient's most recent platelet count. Confirmed with  patient that they are not currently taking any anticoagulation, have any bleeding history or any family history of bleeding disorders. Patient expressed understanding and wished to proceed. All questions were answered. )        Anesthesia Quick Evaluation

## 2017-01-24 NOTE — Anesthesia Pain Management Evaluation Note (Signed)
  CRNA Pain Management Visit Note  Patient: Allison [name], 31 y.o., female  "Hello I am a member of the anesthesia team at [facility] We have an anesthesia team available at all times to provide care throughout the hospital, including epidural management and anesthesia for C-section. I don't know your plan for the delivery whether it a natural birth, water birth, IV sedation, nitrous supplementation, doula or epidural, but we want to meet your pain goals."   1.Was your pain managed to your expectations on prior hospitalizations?   Yes   2.What is your expectation for pain management during this hospitalization?     Epidural  3.How can we help you reach that goal? Epidural when ready  Record the patient's initial score and the patient's pain goal.   Pain: 0  Pain Goal: 5 The [facility] wants you to be able to say your pain was always managed very well.  Everette Rank 01/24/2017

## 2017-01-24 NOTE — Anesthesia Procedure Notes (Signed)
Epidural Patient location during procedure: OB Start time: 01/24/2017 6:05 PM End time: 01/24/2017 6:31 PM  Staffing Anesthesiologist: Annye Asa Performed: anesthesiologist   Preanesthetic Checklist Completed: patient identified, surgical consent, pre-op evaluation, timeout performed, IV checked, risks and benefits discussed and monitors and equipment checked  Epidural Patient position: sitting Prep: site prepped and draped and DuraPrep Patient monitoring: blood pressure, continuous pulse ox and heart rate Approach: midline Location: L3-L4 Injection technique: LOR air  Needle:  Needle type: Tuohy  Needle gauge: 17 G Needle length: 9 cm Needle insertion depth: 8.5 cm Catheter type: closed end flexible Catheter size: 19 Gauge Catheter at skin depth: 15 cm Test dose: negative (1% lidocaine)  Assessment Events: blood not aspirated, injection not painful, no injection resistance, negative IV test and no paresthesia  Additional Notes Pt identified in Labor room.  Monitors applied. Working IV access confirmed. Sterile prep, drape lumbar spine.  1% lido local L 3,4.  #17ga Touhy LOR air at 8.5 cm L 3,4, cath in easily to 15 cm skin. Test dose OK, cath dosed and infusion begun.  Patient asymptomatic, VSS, no heme aspirated, tolerated well.  Jenita Seashore, MDReason for block:procedure for pain

## 2017-01-24 NOTE — Progress Notes (Signed)
Allison [name] is a 32 y.o. G2P1001 at [redacted]w[redacted]d by ultrasound admitted for induction of labor due to Hypertension.  Subjective: Comfortable with epidural.  Objective: BP (!) 149/81   Pulse 80   Temp 98 F (36.7 C) (Axillary)   Resp 16   Ht 5\' 7"  (1.702 m)   Wt 222 lb (100.7 kg)   LMP 05/11/2016   BMI 34.77 kg/m  No intake/output data recorded. No intake/output data recorded.  FHT:  FHR: 140 bpm, variability: moderate,  accelerations:  Present,  decelerations:  Absent UC:   regular, every 3-5 minutes SVE:   Dilation: 1.5 Effacement (%): 50 Station: -3 Exam by:: [employer]  Labs: Lab Results  Component Value Date   WBC 11.9 (H) 01/24/2017   HGB 13.5 01/24/2017   HCT 39.7 01/24/2017   MCV 87.6 01/24/2017   PLT 193 01/24/2017    Assessment / Plan: IOL for gestational hypertension. Ripening phase. Foley inserted and inflated with 60cc of H2O.  Labor: Ripening phase Preeclampsia:  n/a Fetal Wellbeing:  Category I Pain Control:  Epidural I/D:  n/a Anticipated MOD:  NSVD  Len Blalock 01/24/2017, 8:53 PM

## 2017-01-24 NOTE — H&P (Signed)
OBSTETRIC ADMISSION HISTORY AND PHYSICAL  Allison [name] is a 31 y.o. female G2P1001 with IUP at [redacted]w[redacted]d by LMP c/w 7 week [place] presenting for IOL for gHTN. Patient has a history of eclampsia in previous pregnancy. She reports +FMs, No LOF, no VB, no blurry vision, headaches or peripheral edema, and RUQ pain.  She plans on breast feeding. She is undecided about birth control.  Dating: By LMP c/w 7 week [place] --->  Estimated Date of Delivery: 02/09/17  Prenatal History/Complications:  Past Medical History: Past Medical History:  Diagnosis Date  . Asthma     Albuterol as needed  . Depression    has been on meds in past but after had child then everything is fine  . Family history of adverse reaction to anesthesia    grandma gets very sick  . History of bronchitis 2016  . Migraine   . PONV (postoperative nausea and vomiting)    gets very sick and very depressed    Past Surgical History: Past Surgical History:  Procedure Laterality Date  . BREATH TEK H PYLORI N/A 02/04/2015   Procedure: BREATH TEK H PYLORI;  Surgeon: Greer Pickerel, MD;  Location: [facility];  Service: General;  Laterality: N/A;  . CESAREAN SECTION  07/03/2012   Procedure: CESAREAN SECTION;  Surgeon: Ena Dawley, MD;  Location: [facility];  Service: Obstetrics;  Laterality: N/A;  . CHOLECYSTECTOMY N/A 03/22/2016   Procedure: LAPAROSCOPIC CHOLECYSTECTOMY WITH INTRAOPERATIVE CHOLANGIOGRAM;  Surgeon: Greer Pickerel, MD;  Location: [facility];  Service: General;  Laterality: N/A;  . LAPAROSCOPIC GASTRIC SLEEVE RESECTION WITH HIATAL HERNIA REPAIR N/A 05/23/2015   Procedure: LAPAROSCOPIC GASTRIC SLEEVE RESECTION WITH  HIATAL HERNIA REPAIR AND UPPER ENDOSCOPY;  Surgeon: Greer Pickerel, MD;  Location: [facility];  Service: General;  Laterality: N/A;  . TONSILLECTOMY    . UPPER GI ENDOSCOPY  05/23/2015   Procedure: UPPER GI ENDOSCOPY;  Surgeon: Greer Pickerel, MD;  Location: [facility];  Service: General;;  . WISDOM TOOTH EXTRACTION  2007    Obstetrical  History: OB History    Gravida Para Term Preterm AB Living   2 1 1  0 0 1   SAB TAB Ectopic Multiple Live Births   0 0 0 0 1      Social History: Social History   Social History  . Marital status: Married    Spouse name: N/A  . Number of children: N/A  . Years of education: N/A   Social History Main Topics  . Smoking status: Former Smoker    Packs/day: 0.50    Years: 10.00    Types: Cigarettes  . Smokeless tobacco: Never Used  . Alcohol use No  . Drug use: No     Comment: unknown amount  . Sexual activity: Yes    Birth control/ protection: None   Other Topics Concern  . None   Social History Narrative   From [place], [place]   In school at [employer] as of 2012, wants to transfer for speech pathology   Enjoys riding motorcycles   Married, 2010   No kids    Family History: Family History  Problem Relation Age of Onset  . Hypertension Mother   . Depression Mother   . Hypothyroidism Mother   . Hypertension Father   . Depression Other   . Heart disease Paternal Aunt   . Cancer Maternal Grandfather     colon cancer  . Mental illness Sister   . Hypothyroidism Brother   . Alcohol abuse Brother   .  Hypothyroidism Maternal Grandmother   . Hypertension Paternal Grandmother     Allergies: No Known Allergies  Prescriptions Prior to Admission  Medication Sig Dispense Refill Last Dose  . Pediatric Multivit-Minerals-C (COMPLETE MULTI-VITAMIN PO) Take by mouth daily.    Taking     Review of Systems   All systems reviewed and negative except as stated in HPI  Blood pressure (!) 150/95, pulse 79, temperature 98.6 F (37 C), temperature source Oral, resp. rate 20, height 5\' 7"  (1.702 m), weight 100.7 kg (222 lb), last menstrual period 05/11/2016. General appearance: alert, cooperative and no distress Lungs: clear to auscultation bilaterally Heart: regular rate and rhythm Abdomen: soft, non-tender; bowel sounds normal Pelvic: 1/thick/high/posterior/soft  cervix Extremities: Homans sign is negative, no sign of DVT Presentation: cephalic Fetal monitoring: 120, mod var, reactive, no decels Uterine activityFrequency: Every 3 minutes     Prenatal labs: ABO, Rh: A/Positive/-- (10/24 1528) Antibody: Negative (02/14 0851) Rubella: immune RPR: Non Reactive (02/14 0851)  HBsAg: Negative (10/24 1528)  HIV: Non Reactive (02/14 0851)  GBS: Negative (04/11 1615)  2 hr Glucola 73/191/82 Genetic screening  neg Anatomy US normal female  Prenatal Transfer Tool  Maternal Diabetes: Yes:  Diabetes Type:  Insulin/Medication controlled, Diet controlled Genetic Screening: Normal Maternal Ultrasounds/Referrals: Normal Fetal Ultrasounds or other Referrals:  None Maternal Substance Abuse:  No Significant Maternal Medications:  None Significant Maternal Lab Results: Lab values include: Group B Strep negative  Results for orders placed or performed in visit on 01/24/17 (from the past 24 hour(s))  POCT urinalysis dipstick   Collection Time: 01/24/17  9:01 AM  Result Value Ref Range   Color, UA     Clarity, UA     Glucose, UA neg    Bilirubin, UA     Ketones, UA neg    Spec Grav, UA  1.010 - 1.025   Blood, UA large    pH, UA  5.0 - 8.0   Protein, UA tra    Urobilinogen, UA  0.2 or 1.0 E.U./dL   Nitrite, UA neg    Leukocytes, UA Negative Negative  Results for orders placed or performed in visit on 01/23/17 (from the past 24 hour(s))  POCT urinalysis dipstick   Collection Time: 01/23/17  1:54 PM  Result Value Ref Range   Color, UA     Clarity, UA     Glucose, UA neg    Bilirubin, UA     Ketones, UA neg    Spec Grav, UA  1.010 - 1.025   Blood, UA 3+    pH, UA  5.0 - 8.0   Protein, UA neg    Urobilinogen, UA  0.2 or 1.0 E.U./dL   Nitrite, UA neg    Leukocytes, UA Negative Negative  CBC   Collection Time: 01/23/17  2:35 PM  Result Value Ref Range   WBC 10.8 3.4 - 10.8 x10E3/uL   RBC 4.66 3.77 - 5.28 x10E6/uL   Hemoglobin 13.6 11.1 - 15.9  g/dL   Hematocrit 41.1 34.0 - 46.6 %   MCV 88 79 - 97 fL   MCH 29.2 26.6 - 33.0 pg   MCHC 33.1 31.5 - 35.7 g/dL   RDW 13.4 12.3 - 15.4 %   Platelets 196 150 - 379 x10E3/uL  Comprehensive metabolic panel   Collection Time: 01/23/17  2:35 PM  Result Value Ref Range   Glucose 78 65 - 99 mg/dL   BUN 13 6 - 20 mg/dL   Creatinine, Ser 0.45 (L)  0.57 - 1.00 mg/dL   GFR calc non Af Amer 134 >59 mL/min/1.73   GFR calc Af Amer 154 >59 mL/min/1.73   BUN/Creatinine Ratio 29 (H) 9 - 23   Sodium 135 134 - 144 mmol/L   Potassium 4.5 3.5 - 5.2 mmol/L   Chloride 98 96 - 106 mmol/L   CO2 17 (L) 18 - 29 mmol/L   Calcium 9.0 8.7 - 10.2 mg/dL   Total Protein 6.5 6.0 - 8.5 g/dL   Albumin 3.9 3.5 - 5.5 g/dL   Globulin, Total 2.6 1.5 - 4.5 g/dL   Albumin/Globulin Ratio 1.5 1.2 - 2.2   Bilirubin Total 0.3 0.0 - 1.2 mg/dL   Alkaline Phosphatase 98 39 - 117 IU/L   AST 12 0 - 40 IU/L   ALT 5 0 - 32 IU/L  Protein / creatinine ratio, urine   Collection Time: 01/23/17  2:35 PM  Result Value Ref Range   Creatinine, Urine WILL FOLLOW    Protein, Ur WILL FOLLOW    Protein/Creat Ratio WILL FOLLOW     Patient Active Problem List   Diagnosis Date Noted  . Gestational hypertension 01/24/2017  . Gestational diabetes mellitus, class A1 11/22/2016  . Hx of preeclampsia, prior pregnancy, currently pregnant 07/25/2016  . History of cesarean delivery, currently pregnant 07/25/2016  . Supervision of normal pregnancy 06/06/2016  . Elevated triglycerides with high cholesterol 05/23/2015  . Elevated transaminase level 05/23/2015  . 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 Gestational hypertension affecting second pregnancy 05/23/2012  . Drug abuse 01/24/2012  . Depression 07/09/2011  . Smoker 07/09/2011  . Migraine 07/09/2011  . Obesity 07/09/2011  . HLD (hyperlipidemia) 07/09/2011    Assessment: Allison [name] is a 31 y.o. G2P1001 at [redacted]w[redacted]d here for IOL due to development of gHTN, has A1DM, and  is here for TOLAC.  #Labor:attempted foley bulb placement but unable to place, will do low dose pitocin and reattempt in 2 hours #Pain: Epidural upon request and >3cm #FWB: Category 1 tracing #ID:  GBS negative  #MOF: breast #MOC: undecided #Circ:  Yes #gHTN: preE labs negative except UPC still pending. Last BP 150/95. Has been on asa during pregnancy. No antihypertensives needed at this time #A1DM: monitor POCG q4h last glucose 75  Francene Boyers, MD 01/24/2017, 11:34 AM   OB FELLOW HISTORY AND PHYSICAL ATTESTATION  I confirm that I have verified the information documented in the resident's note and that I have also personally reperformed the physical exam and all medical decision making activities.      Jacquiline Doe 01/24/2017, 1:29 PM

## 2017-01-25 ENCOUNTER — Encounter (HOSPITAL_COMMUNITY): Payer: Self-pay

## 2017-01-25 LAB — COMPREHENSIVE METABOLIC PANEL
ALBUMIN: 2.5 g/dL — AB (ref 3.5–5.0)
ALK PHOS: 76 U/L (ref 38–126)
ALT: 8 U/L — ABNORMAL LOW (ref 14–54)
ANION GAP: 6 (ref 5–15)
AST: 14 U/L — ABNORMAL LOW (ref 15–41)
BILIRUBIN TOTAL: 0.9 mg/dL (ref 0.3–1.2)
BUN: 9 mg/dL (ref 6–20)
CALCIUM: 8.4 mg/dL — AB (ref 8.9–10.3)
CO2: 23 mmol/L (ref 22–32)
Chloride: 104 mmol/L (ref 101–111)
Creatinine, Ser: 0.45 mg/dL (ref 0.44–1.00)
GFR calc Af Amer: >60 mL/min (ref >60)
GFR calc non Af Amer: >60 mL/min (ref >60)
GLUCOSE: 92 mg/dL (ref 65–99)
Potassium: 3.4 mmol/L — ABNORMAL LOW (ref 3.5–5.1)
Sodium: 133 mmol/L — ABNORMAL LOW (ref 135–145)
Total Protein: 5.5 g/dL — ABNORMAL LOW (ref 6.5–8.1)

## 2017-01-25 LAB — CBC
HCT: 36.5 % (ref 36.0–46.0)
HEMOGLOBIN: 12.4 g/dL (ref 12.0–15.0)
MCH: 29.9 pg (ref 26.0–34.0)
MCHC: 34 g/dL (ref 30.0–36.0)
MCV: 88 fL (ref 78.0–100.0)
Platelets: 135 10*3/uL — ABNORMAL LOW (ref 150–400)
RBC: 4.15 MIL/uL (ref 3.87–5.11)
RDW: 13.8 % (ref 11.5–15.5)
WBC: 14.7 10*3/uL — ABNORMAL HIGH (ref 4.0–10.5)

## 2017-01-25 LAB — URINE CULTURE

## 2017-01-25 LAB — GLUCOSE, CAPILLARY
GLUCOSE-CAPILLARY: 80 mg/dL (ref 65–99)
GLUCOSE-CAPILLARY: 76 mg/dL (ref 65–99)
Glucose-Capillary: 104 mg/dL — ABNORMAL HIGH (ref 65–99)
Glucose-Capillary: 78 mg/dL (ref 65–99)
Glucose-Capillary: 84 mg/dL (ref 65–99)
Glucose-Capillary: 101 mg/dL — ABNORMAL HIGH (ref 65–99)

## 2017-01-25 LAB — RPR: RPR: NONREACTIVE

## 2017-01-25 MED ORDER — OXYTOCIN 40 UNITS IN LACTATED RINGERS INFUSION - SIMPLE MED: 1.0000 m[IU]/min | INTRAVENOUS | Status: DC

## 2017-01-25 MED ORDER — LACTATED RINGERS IV SOLN
INTRAVENOUS | Status: DC
  Administered 2017-01-25: 10:00:00 via INTRAUTERINE

## 2017-01-25 NOTE — Progress Notes (Signed)
Patient ID: Allison [name], female   DOB: May 04, 1986, 31 y.o.   MRN: [id number]  S: Patient seen & examined for progress of labor. Patient comfortable with epidural.     O:  Vitals:   01/25/17 1731 01/25/17 1755  BP: 126/75   Pulse: 78   Resp: 18 18  Temp:  99.4 F (37.4 C)  TempSrc:  Axillary  SpO2:    Weight:    Height:      Dilation: 6 Effacement (%): 90 Cervical Position: Posterior Station: -1 (caput) Presentation: Vertex Exam by:: [employer]  FHT: 135bpm, mod var, +accels, occasional variable decels with contractions IUPC: Rare, inadequate   A/P: Restart pitocin, increase 2x2 Continue expectant management Anticipate SVD

## 2017-01-25 NOTE — Progress Notes (Signed)
Allison [name] is a 31 y.o. G2P1001 at [redacted]w[redacted]d by ultrasound admitted for induction of labor due to Hypertension.  Objective: BP (!) 153/86   Pulse (!) 128   Temp 99.4 F (37.4 C) (Axillary)   Resp 18   Ht 5\' 7"  (1.702 m)   Wt 222 lb (100.7 kg)   LMP 05/11/2016   SpO2 99%   BMI 34.77 kg/m  I/O last 3 completed shifts: In: -  Out: 700 [Urine:700] No intake/output data recorded.  FHT:  FHR: 150 bpm, variability: moderate,  accelerations:  Present,  decelerations:  Present variable  UC:   regular, every 4-5 minutes SVE:   Dilation: 10 Effacement (%): 100 Station: +1, +2 Exam by:: Kennon Rounds MD   Labs: Lab Results  Component Value Date   WBC 14.7 (H) 01/25/2017   HGB 12.4 01/25/2017   HCT 36.5 01/25/2017   MCV 88.0 01/25/2017   PLT 135 (L) 01/25/2017    Assessment / Plan: IUP at term. Gestational hypertension and gestational diabetes. TOLAC. End first stage.   Plan: Continue pitocin and labor down. Will start pushing within the hour.   Len Blalock 01/25/2017, 9:19 PM   I confirm that I have verified the information documented in the student nurse midwife's note and that I have also personally reperformed the physical exam and all medical decision making activities.  Serita Grammes CNM 01/26/2017 3:37 AM

## 2017-01-25 NOTE — Progress Notes (Signed)
Vitals:   01/25/17 0331 01/25/17 0401  BP: 130/70 116/71  Pulse: 69 68  Resp:    Temp:     FHR Category I  Contractions mild q2-3 Cervix 5/60/-3 Pitocin 40mu/min  Patient sleeping.

## 2017-01-25 NOTE — Progress Notes (Signed)
Patient ID: BARI LEIB, female   DOB: [birth year], 31 y.o.   MRN: [id number]  S: Patient seen & examined for progress of labor. Patient comfortable with epidural.    O:  Vitals:   01/25/17 0931 01/25/17 1001 01/25/17 1031 01/25/17 1101  BP: (!) 144/71 (!) 149/80 140/82 (!) 152/80  Pulse: 79 80 78 88  Resp: 18 18 18 18   Temp:   98.3 F (36.8 C)   TempSrc:   Oral   SpO2:      Weight:      Height:        Dilation: 6 Effacement (%): 80 Cervical Position: Posterior Station: -2 Presentation: Vertex Exam by:: Bronson Curb NR   FHT: 130bpm, mod var, +accels, repetitive variable decels nadir to 60s lasting 1 minute during contractions, resolves to baseline afterwards. After position change to high fowlers, variable decels resolved. Will keep in this position for now.  IUPC: q30min, inadequate <100MVU  Urine from foley catheter is blood-tinged.  A/P: Increased amnioinfusion to 250cc/hr Sitting high-fowlers has resolved the variable decels, continue in this position Inadequate contractions, continue increasing pitocin  Continue expectant management Anticipate SVD

## 2017-01-25 NOTE — Progress Notes (Signed)
Patient ID: Allison [name], female   DOB: [birth year], 31 y.o.   MRN: [id number]  S: Patient seen & examined for progress of labor. Patient comfortable with epidural.    O:  Vitals:   01/25/17 0631 01/25/17 0701 01/25/17 0731 01/25/17 0801  BP: 139/75 (!) 144/81 (!) 149/98 (!) 172/91  Pulse: 69 63 75 76  Resp:   18 18  Temp:   98.2 F (36.8 C)   TempSrc:   Oral   SpO2:      Weight:      Height:        Dilation: 6 Effacement (%): 80 Cervical Position: Posterior Station: -2 Presentation: Vertex Exam by:: Manus Gunning  Patient just SROM, clear fluid IUPC placed 2/2 cannot pick up contractions and having variable decels  FHT: 120bpm, mod var, +accels, occasional variable decels lasting <30 sec TOCO: q3-69min   A/P: SROM occurred, clear IUPC placed Continue expectant management Anticipate SVD

## 2017-01-25 NOTE — Progress Notes (Signed)
Subjective: Patient complaining of feeling like left leg was swollen. Minimal swelling noted on exam. Pt requested that epidural be turned off due to anxiety.   Objective: BP 105/67   Pulse 76   Temp 98.9 F (37.2 C) (Oral)   Resp 16   Ht 5\' 7"  (1.702 m)   Wt 222 lb (100.7 kg)   LMP 05/11/2016   SpO2 97%   BMI 34.77 kg/m   FHT:  FHR: 120 bpm, variability: moderate,  accelerations:  Present,  decelerations:  Present variable  UC:   regular, every 3-5 minutes SVE:   Dilation: 5 Effacement (%): 60 Station: -3 Exam by:: Lynda Rainwater SNM  Pitocin: 53mu/min  Assessment / Plan: Foley bulb out at 0230. Pitocin continued 62mu/min  Labor: Progressing normally Fetal Wellbeing:  Category I Pain Control:  Epidural I/D:  n/a Anticipated MOD:  NSVD  Len Blalock 01/25/2017, 3:14 AM

## 2017-01-26 ENCOUNTER — Encounter (HOSPITAL_COMMUNITY): Payer: Self-pay | Admitting: *Deleted

## 2017-01-26 DIAGNOSIS — O34219 Maternal care for unspecified type scar from previous cesarean delivery: Secondary | ICD-10-CM

## 2017-01-26 DIAGNOSIS — O134 Gestational [pregnancy-induced] hypertension without significant proteinuria, complicating childbirth: Secondary | ICD-10-CM

## 2017-01-26 DIAGNOSIS — O34211 Maternal care for low transverse scar from previous cesarean delivery: Secondary | ICD-10-CM

## 2017-01-26 DIAGNOSIS — Z3A37 37 weeks gestation of pregnancy: Secondary | ICD-10-CM

## 2017-01-26 DIAGNOSIS — O24429 Gestational diabetes mellitus in childbirth, unspecified control: Secondary | ICD-10-CM

## 2017-01-26 LAB — CBC
HEMATOCRIT: 36.6 % (ref 36.0–46.0)
Hemoglobin: 12.4 g/dL (ref 12.0–15.0)
MCH: 29.2 pg (ref 26.0–34.0)
MCHC: 33.9 g/dL (ref 30.0–36.0)
MCV: 86.1 fL (ref 78.0–100.0)
PLATELETS: 156 10*3/uL (ref 150–400)
RBC: 4.25 MIL/uL (ref 3.87–5.11)
RDW: 14 % (ref 11.5–15.5)
WBC: 16.9 10*3/uL — AB (ref 4.0–10.5)

## 2017-01-26 LAB — GLUCOSE, RANDOM: Glucose, Bld: 97 mg/dL (ref 65–99)

## 2017-01-26 LAB — URINE CULTURE

## 2017-01-26 MED ORDER — DIPHENHYDRAMINE HCL 25 MG PO CAPS: 25.0000 mg | ORAL_CAPSULE | Freq: Four times a day (QID) | ORAL | Status: DC | PRN

## 2017-01-26 MED ORDER — MEASLES, MUMPS & RUBELLA VAC ~~LOC~~ INJ
0.5000 mL | INJECTION | Freq: Once | SUBCUTANEOUS | Status: DC
  Filled 2017-01-26: qty 0.5

## 2017-01-26 MED ORDER — TRIAMTERENE-HCTZ 37.5-25 MG PO TABS
1.0000 | ORAL_TABLET | Freq: Every day | ORAL | Status: DC
  Filled 2017-01-26: qty 1

## 2017-01-26 MED ORDER — SIMETHICONE 80 MG PO CHEW: 80.0000 mg | CHEWABLE_TABLET | ORAL | Status: DC | PRN

## 2017-01-26 MED ORDER — OXYCODONE HCL 5 MG PO TABS
5.0000 mg | ORAL_TABLET | ORAL | Status: DC | PRN
Start: 2017-01-26 — End: 2017-01-27

## 2017-01-26 MED ORDER — ONDANSETRON HCL 4 MG/2ML IJ SOLN: 4.0000 mg | INTRAMUSCULAR | Status: DC | PRN

## 2017-01-26 MED ORDER — OXYCODONE HCL 5 MG PO TABS: 10.0000 mg | ORAL_TABLET | ORAL | Status: DC | PRN

## 2017-01-26 MED ORDER — ACETAMINOPHEN 325 MG PO TABS
650.0000 mg | ORAL_TABLET | ORAL | Status: DC | PRN
  Administered 2017-01-26 – 2017-01-27 (×2): 650 mg via ORAL
  Filled 2017-01-26 (×2): qty 2

## 2017-01-26 MED ORDER — DOCUSATE SODIUM 100 MG PO CAPS: 100.0000 mg | ORAL_CAPSULE | Freq: Two times a day (BID) | ORAL | 0 refills | Status: DC

## 2017-01-26 MED ORDER — IBUPROFEN 600 MG PO TABS
600.0000 mg | ORAL_TABLET | Freq: Four times a day (QID) | ORAL | Status: DC
  Administered 2017-01-26 – 2017-01-27 (×6): 600 mg via ORAL
  Filled 2017-01-26 (×5): qty 1

## 2017-01-26 MED ORDER — AMLODIPINE BESYLATE 5 MG PO TABS: 5.0000 mg | ORAL_TABLET | Freq: Every day | ORAL | 0 refills | Status: DC

## 2017-01-26 MED ORDER — METHYLERGONOVINE MALEATE 0.2 MG PO TABS: 0.2000 mg | ORAL_TABLET | ORAL | Status: DC | PRN

## 2017-01-26 MED ORDER — LOPERAMIDE HCL 2 MG PO CAPS
2.0000 mg | ORAL_CAPSULE | ORAL | Status: DC | PRN
  Administered 2017-01-26: 2 mg via ORAL
  Filled 2017-01-26 (×2): qty 1

## 2017-01-26 MED ORDER — TETANUS-DIPHTH-ACELL PERTUSSIS 5-2.5-18.5 LF-MCG/0.5 IM SUSP
0.5000 mL | Freq: Once | INTRAMUSCULAR | Status: AC
  Administered 2017-01-27: 0.5 mL via INTRAMUSCULAR

## 2017-01-26 MED ORDER — COCONUT OIL OIL: 1.0000 "application " | TOPICAL_OIL | Status: DC | PRN

## 2017-01-26 MED ORDER — SENNOSIDES-DOCUSATE SODIUM 8.6-50 MG PO TABS
2.0000 | ORAL_TABLET | ORAL | Status: DC
  Administered 2017-01-26: 2 via ORAL
  Filled 2017-01-26: qty 2

## 2017-01-26 MED ORDER — BENZOCAINE-MENTHOL 20-0.5 % EX AERO
1.0000 "application " | INHALATION_SPRAY | CUTANEOUS | Status: DC | PRN
  Administered 2017-01-26: 1 via TOPICAL
  Filled 2017-01-26: qty 56

## 2017-01-26 MED ORDER — ZOLPIDEM TARTRATE 5 MG PO TABS
5.0000 mg | ORAL_TABLET | Freq: Every evening | ORAL | Status: DC | PRN
Start: 2017-01-26 — End: 2017-01-27

## 2017-01-26 MED ORDER — AMLODIPINE BESYLATE 5 MG PO TABS
5.0000 mg | ORAL_TABLET | Freq: Every day | ORAL | Status: DC
  Administered 2017-01-26 – 2017-01-27 (×2): 5 mg via ORAL
  Filled 2017-01-26 (×4): qty 1

## 2017-01-26 MED ORDER — PRENATAL MULTIVITAMIN CH
1.0000 | ORAL_TABLET | Freq: Every day | ORAL | Status: DC
  Administered 2017-01-26: 1 via ORAL
  Filled 2017-01-26: qty 1

## 2017-01-26 MED ORDER — DIBUCAINE 1 % RE OINT: 1.0000 "application " | TOPICAL_OINTMENT | RECTAL | Status: DC | PRN

## 2017-01-26 MED ORDER — ONDANSETRON HCL 4 MG PO TABS: 4.0000 mg | ORAL_TABLET | ORAL | Status: DC | PRN

## 2017-01-26 MED ORDER — IBUPROFEN 600 MG PO TABS: 600.0000 mg | ORAL_TABLET | Freq: Four times a day (QID) | ORAL | 0 refills | Status: DC

## 2017-01-26 MED ORDER — METHYLERGONOVINE MALEATE 0.2 MG/ML IJ SOLN: 0.2000 mg | INTRAMUSCULAR | Status: DC | PRN

## 2017-01-26 MED ORDER — PNEUMOCOCCAL VAC POLYVALENT 25 MCG/0.5ML IJ INJ
0.5000 mL | INJECTION | INTRAMUSCULAR | Status: AC
  Administered 2017-01-27: 0.5 mL via INTRAMUSCULAR
  Filled 2017-01-26: qty 0.5

## 2017-01-26 MED ORDER — WITCH HAZEL-GLYCERIN EX PADS: 1.0000 "application " | MEDICATED_PAD | CUTANEOUS | Status: DC | PRN

## 2017-01-26 NOTE — Progress Notes (Signed)
MOB was referred for history of depression/anxiety. * Referral screened out by Clinical Social Worker because none of the following criteria appear to apply: ~ History of anxiety/depression during this pregnancy, or of post-partum depression. ~ Diagnosis of anxiety and/or depression within last 3 years OR * MOB's symptoms currently being treated with medication and/or therapy.  CSW completed chart review and there were no MH concerns prenatally.   Please contact the Clinical Social Worker if needs arise, or if MOB requests.  Laurey Arrow, MSW, LCSW Clinical Social Work [phone]

## 2017-01-26 NOTE — Progress Notes (Signed)
Post Partum Day #1 Subjective: no complaints, up ad lib and tolerating PO; breast feeding going well; family planning for contraception  Objective: BPs 138/80, 153/78 Blood pressure (!) 147/81, pulse 80, temperature 98.6 F (37 C), temperature source Oral, resp. rate 18, height 5\' 7"  (1.702 m), weight 100.7 kg (222 lb), last menstrual period 05/11/2016, SpO2 99 %, unknown if currently breastfeeding.  Physical Exam:  General: alert, cooperative and no distress Lochia: appropriate Uterine Fundus: firm DVT Evaluation: No evidence of DVT seen on physical exam.   Recent Labs  01/25/17 1636 01/25/17 2355  HGB 12.4 12.4  HCT 36.5 36.6    Assessment/Plan: Plan for discharge tomorrow Started Norvasc 5mg  today  LOS: 2 days   Allison [name], [place] 01/26/2017, 8:21 AM

## 2017-01-26 NOTE — Anesthesia Postprocedure Evaluation (Signed)
Anesthesia Post Note  Patient: Allison [name]  Procedure(s) Performed: * No procedures listed *  Patient location during evaluation: Mother Baby Anesthesia Type: Epidural Level of consciousness: awake and alert and oriented Pain management: satisfactory to patient Vital Signs Assessment: post-procedure vital signs reviewed and stable Respiratory status: spontaneous breathing and nonlabored ventilation Cardiovascular status: stable Postop Assessment: no headache, no backache, no signs of nausea or vomiting, adequate PO intake and patient able to bend at knees (patient up walking) Anesthetic complications: no        Last Vitals:  Vitals:   01/26/17 0639 01/26/17 0852  BP: (!) 147/81 (!) 147/76  Pulse: 80   Resp: 18   Temp: 37 C     Last Pain:  Vitals:   01/26/17 0807  TempSrc:   PainSc: 2    Pain Goal: Patients Stated Pain Goal: 4 (01/24/17 1800)               Willa Rough

## 2017-01-26 NOTE — Lactation Note (Signed)
This note was copied from a baby's chart. Lactation Consultation Note  Patient Name: Allison [name] EXNTZ'G Date: 01/26/2017 Reason for consult: Initial assessment;Infant < 6lbs Breastfeeding consultation services and support information given and reviewed.  This is mom's second baby and newborn is 41 hours old.  Mom states her first baby was premature and she pumped and bottle fed.  She had a good supply until baby was hospitalized at 2 months.  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  Newborn has been sleepy and only attempts at breast.  Mom wearing shells.  She is currently pumping for the second time and has been spoon feeding baby.  She obtained 25 mls with first pumping.  Suggested trying a curved tip syringe with finger feeding.  Explained how this is done and instructed to call out for assist if needed.  Instructed to feed with any feeding cue.  Maternal Data Has patient been taught Hand Expression?: Yes Does the patient have breastfeeding experience prior to this delivery?: Yes  Feeding Feeding Type: Breast Fed  LATCH Score/Interventions                      Lactation Tools Discussed/Used Pump Review: Setup, frequency, and cleaning;Milk Storage Initiated by:: RN Date initiated:: 01/26/17   Consult Status Consult Status: Follow-up Date: 01/27/17 Follow-up type: In-patient    Ave Filter 01/26/2017, 1:50 PM

## 2017-01-27 NOTE — Discharge Summary (Signed)
OB Discharge Summary  Patient Name: Allison [name] DOB: 15-May-1986 MRN: [id number]  Date of admission: 01/24/2017 Delivering MD: Serita Grammes D   Date of discharge: 01/27/2017  Admitting diagnosis: INDUCTION Intrauterine pregnancy: [redacted]w[redacted]d     Secondary diagnosis:Principal Problem:   VBAC (vaginal birth after Cesarean) Active Problems:   Gestational hypertension  Additional problems:none     Discharge diagnosis: Term Pregnancy Delivered and Gestational Hypertension                                                                     Post partum procedures:n/a  Augmentation: Pitocin and Cytotec  Complications: None  Hospital course:  Induction of Labor With Vaginal Delivery   31 y.o. yo [id number] at [redacted]w[redacted]d was admitted to the hospital 01/24/2017 for induction of labor.  Indication for induction: Gestational hypertension.  Patient had an uncomplicated labor course as follows: Membrane Rupture Time/Date: 9:40 AM ,01/25/2017   Intrapartum Procedures: Episiotomy: None [1]                                         Lacerations:  1st degree [2];Vaginal [6]  Patient had delivery of a Viable infant.  Information for the patient's newborn:  Tynlee, Bayle [[id number]]  Delivery Method: Vaginal, Spontaneous Delivery (Filed from Delivery Summary)   01/25/2017  Details of delivery can be found in separate delivery note.  Patient had a routine postpartum course. Patient is discharged home 01/27/17.  Physical exam  Vitals:   01/26/17 1516 01/26/17 1809 01/27/17 0645 01/27/17 0929  BP: 139/71 135/70 133/67 139/71  Pulse: 78 87 79   Resp: 18 12 18    Temp: 98 F (36.7 C) 98.2 F (36.8 C) 97.9 F (36.6 C)   TempSrc:  Oral    SpO2:  98%    Weight:      Height:       General: alert, cooperative and no distress Lochia: appropriate Uterine Fundus: firm Incision: N/A DVT Evaluation: No evidence of DVT seen on physical exam. Labs: Lab Results  Component Value Date   WBC 16.9  (H) 01/25/2017   HGB 12.4 01/25/2017   HCT 36.6 01/25/2017   MCV 86.1 01/25/2017   PLT 156 01/25/2017   CMP Latest Ref Rng & Units 01/26/2017  Glucose 65 - 99 mg/dL 97  BUN 6 - 20 mg/dL -  Creatinine 0.44 - 1.00 mg/dL -  Sodium 135 - 145 mmol/L -  Potassium 3.5 - 5.1 mmol/L -  Chloride 101 - 111 mmol/L -  CO2 22 - 32 mmol/L -  Calcium 8.9 - 10.3 mg/dL -  Total Protein 6.5 - 8.1 g/dL -  Total Bilirubin 0.3 - 1.2 mg/dL -  Alkaline Phos 38 - 126 U/L -  AST 15 - 41 U/L -  ALT 14 - 54 U/L -    Discharge instruction: per After Visit Summary and "Baby and Me Booklet".  After Visit Meds:  Allergies as of 01/27/2017   No Known Allergies     Medication List    TAKE these medications   acetaminophen 500 MG tablet Commonly known as:  TYLENOL Take 1,000 mg  by mouth every 6 (six) hours as needed for mild pain.   albuterol 108 (90 Base) MCG/ACT inhaler Commonly known as:  PROVENTIL HFA;VENTOLIN HFA Inhale 2 puffs into the lungs every 6 (six) hours as needed for wheezing or shortness of breath.   amLODipine 5 MG tablet Commonly known as:  NORVASC Take 1 tablet (5 mg total) by mouth daily.   COMPLETE MULTI-VITAMIN PO Take 1 tablet by mouth daily.   docusate sodium 100 MG capsule Commonly known as:  COLACE Take 1 capsule (100 mg total) by mouth 2 (two) times daily.   ibuprofen 600 MG tablet Commonly known as:  ADVIL,MOTRIN Take 1 tablet (600 mg total) by mouth every 6 (six) hours.   oxymetazoline 0.05 % nasal spray Commonly known as:  AFRIN Place 1 spray into both nostrils daily as needed for congestion.       Diet: routine diet  Activity: Advance as tolerated. Pelvic rest for 6 weeks.   Outpatient follow up:6 weeks Follow up Appt:No future appointments. Follow up visit: No Follow-up on file.  Postpartum contraception: Undecided  Newborn Data: Live born female  Birth Weight: 5 lb 11.5 oz (2595 g) APGAR: 8, 9  Baby Feeding: Breast Disposition:home with  mother   01/27/2017 Koren Shiver, CNM

## 2017-01-27 NOTE — Progress Notes (Signed)
Patient ID: JANAL HAAK, female   DOB: 09-Oct-1985, 31 y.o.   MRN: [id number]  POSTPARTUM PROGRESS NOTE  Post Partum Day #2 Subjective:  Allison [name] is a 31 y.o. [id number] [redacted]w[redacted]d s/p VBAC.  No acute events overnight.  Pt denies problems with ambulating, voiding or po intake.  She denies nausea or vomiting.  Pain is well controlled.  She has had flatus. She has not had bowel movement.  Lochia Small. Urine is clear, no longer bloody.  Objective: Blood pressure 139/71, pulse 79, temperature 97.9 F (36.6 C), resp. rate 18, height 5\' 7"  (1.702 m), weight 222 lb (100.7 kg), last menstrual period 05/11/2016, SpO2 98 %, unknown if currently breastfeeding.  Physical Exam:  General: alert, cooperative and no distress Lochia:normal flow Chest: CTAB Heart: RRR no m/r/g Abdomen: +BS, soft, nontender,  Uterine Fundus: firm, below umbilicus DVT Evaluation: No calf swelling or tenderness Extremities: Trace edema   Recent Labs  01/25/17 1636 01/25/17 2355  HGB 12.4 12.4  HCT 36.5 36.6    Assessment/Plan:  ASSESSMENT: Allison [name] is a 31 y.o. [id number] [redacted]w[redacted]d s/p VBAC.  Plan for discharge tomorrow, Breastfeeding, Lactation consult and Contraception Condoms and natural family planning   LOS: 3 days   Katherine Basset, DO [place] [facility], [facility]  01/27/2017, 9:42 AM

## 2017-01-27 NOTE — Lactation Note (Signed)
This note was copied from a baby's chart. Lactation Consultation Note  Patient Name: Allison [name] EFEOF'H Date: 01/27/2017 Reason for consult: Follow-up assessment;Infant < 6lbs Observed baby latch well to breast.  Mom is independent with good technique.  Reviewed waking techniques and breast massage.  Mom continues to pump and hand express about 5 mls and gives per spoon.  Instructed to keep a feeding diary once home.  Still using a nipple shield on left side.  Lactation outpatient services and support information reviewed and encouraged.  Maternal Data    Feeding Feeding Type: Breast Fed Length of feed: 20 min  LATCH Score/Interventions Latch: Grasps breast easily, tongue down, lips flanged, rhythmical sucking. Intervention(s): Skin to skin;Teach feeding cues;Waking techniques Intervention(s): Breast massage;Breast compression  Audible Swallowing: A few with stimulation Intervention(s): Hand expression  Type of Nipple: Everted at rest and after stimulation  Comfort (Breast/Nipple): Soft / non-tender     Hold (Positioning): No assistance needed to correctly position infant at breast.  LATCH Score: 9  Lactation Tools Discussed/Used     Consult Status Consult Status: Complete    Ave Filter 01/27/2017, 10:28 AM

## 2017-01-31 ENCOUNTER — Encounter: Payer: BLUE CROSS/BLUE SHIELD | Admitting: Obstetrics & Gynecology

## 2017-02-05 ENCOUNTER — Ambulatory Visit (INDEPENDENT_AMBULATORY_CARE_PROVIDER_SITE_OTHER): Payer: BLUE CROSS/BLUE SHIELD | Admitting: Adult Health

## 2017-02-05 ENCOUNTER — Encounter: Payer: Self-pay | Admitting: Adult Health

## 2017-02-05 VITALS — BP 122/88 | HR 77 | Ht 67.0 in | Wt 202.0 lb

## 2017-02-05 DIAGNOSIS — I10 Essential (primary) hypertension: Secondary | ICD-10-CM | POA: Diagnosis not present

## 2017-02-05 NOTE — Progress Notes (Signed)
Subjective:     Patient ID: Allison [name], female   DOB: 1985-12-09, 31 y.o.   MRN: [id number]  HPI Safia is a 31 year old white female in for BP check, she was induced 4/19 for gestational hypertension and had vaginal delivery 4/20 of baby boy, [name].She is breastfeeding and was discharged home 4/22 on norvasc but has not taken well till last 3 days. She is having headaches in am and pm but feels lik it is because she is tired, also has 31 year old.   Review of Systems +headache in am and pm, takes ibuprofen Denies any vision changes or dizziness Reviewed past medical,surgical, social and family history. Reviewed medications and allergies.     Objective:   Physical Exam BP 122/88 (BP Location: Right Arm, Patient Position: Sitting, Cuff Size: Normal)   Pulse 77   Ht 5\' 7"  (1.702 m)   Wt 202 lb (91.6 kg)   LMP 05/11/2016   Breastfeeding? Yes   BMI 31.64 kg/m  Skin warm and dry. Lungs: clear to ausculation bilaterally. Cardiovascular: regular rate and rhythm.no RUQ tenderness and DTRs 2+, no clonus.    Assessment:     Hypertension     Plan:     Continue norvasc daily Decrease salt and sugars, increase water Follow up in 1 week

## 2017-02-11 ENCOUNTER — Telehealth (HOSPITAL_COMMUNITY): Payer: Self-pay | Admitting: Lactation Services

## 2017-02-11 ENCOUNTER — Telehealth (HOSPITAL_COMMUNITY): Payer: Self-pay

## 2017-02-11 ENCOUNTER — Encounter: Payer: Self-pay | Admitting: Adult Health

## 2017-02-11 ENCOUNTER — Telehealth: Payer: Self-pay | Admitting: Women's Health

## 2017-02-11 ENCOUNTER — Ambulatory Visit (INDEPENDENT_AMBULATORY_CARE_PROVIDER_SITE_OTHER): Payer: BLUE CROSS/BLUE SHIELD | Admitting: Adult Health

## 2017-02-11 VITALS — BP 120/68 | HR 80 | Ht 67.0 in | Wt 198.0 lb

## 2017-02-11 DIAGNOSIS — O9279 Other disorders of lactation: Secondary | ICD-10-CM

## 2017-02-11 DIAGNOSIS — N644 Mastodynia: Secondary | ICD-10-CM | POA: Diagnosis not present

## 2017-02-11 DIAGNOSIS — F53 Postpartum depression: Secondary | ICD-10-CM | POA: Insufficient documentation

## 2017-02-11 DIAGNOSIS — O99345 Other mental disorders complicating the puerperium: Secondary | ICD-10-CM

## 2017-02-11 DIAGNOSIS — O9229 Other disorders of breast associated with pregnancy and the puerperium: Secondary | ICD-10-CM | POA: Insufficient documentation

## 2017-02-11 DIAGNOSIS — I10 Essential (primary) hypertension: Secondary | ICD-10-CM

## 2017-02-11 MED ORDER — ESCITALOPRAM OXALATE 10 MG PO TABS: 10.0000 mg | ORAL_TABLET | Freq: Every day | ORAL | 6 refills | Status: DC

## 2017-02-11 NOTE — Telephone Encounter (Signed)
Addi Delma Freeze, RN on [facility] and sister to patient Deanette Tullius call to set up OP [place] appt for her sister. She reports her sister is not able to make the call right now. Addi reports mom has 57 day old infant, [name]. Mom is c/o pain with latching and has inverted/flat nipples. Addi reports pumping causes mom's nipples to crack and bleed, she is using a DEBP of unknown brand and Addi is not sure if she has different flange sizes to try. Mom is not wanting to pump due to pain. Addi reports she feels her sister may be suffering from PPD, she reports they are calling today to have her seen by PCP.First Available OP appt made for Wednesday 02/13/17 @ 0830.

## 2017-02-11 NOTE — Telephone Encounter (Signed)
Patient called stating she thinks she may have thrush on her breast and nipple. She is having breast feeding issues. Will w/I with Anderson Malta today.

## 2017-02-11 NOTE — Telephone Encounter (Signed)
Patient called to confirm that she had an OP appointment on Wed 02/13/2017.  Also discussed with her strategies to help her rt? cracked nipple heal. She is currently using comfort gels and nipple cream together.  Recommended she use the comfort gels independent of the nipple cream.  Also discussed ways to increase production, including breast massage, hands on pumping and hand expression, on the opposite breast because production has slowed significantly.  Explained that the goals for now were to feed the baby and protect her milk supply. BF assessment when she comes in for OP appointment on Wed.

## 2017-02-11 NOTE — Progress Notes (Signed)
Subjective:     Patient ID: Allison [name], female   DOB: 1986-05-24, 31 y.o.   MRN: [id number]  HPI Allison [name] is a 31 year old white female, married in complaining of pain in breast when feeding and decrease in milk supply.Her mom is with her and says she needs to talk about depression.   Review of Systems Breast hurt when breast feeding and milk supply as much Reviewed past medical,surgical, social and family history. Reviewed medications and allergies.     Objective:   Physical Exam BP 120/68 (BP Location: Left Arm, Patient Position: Sitting, Cuff Size: Normal)   Pulse 80   Ht 5\' 7"  (1.702 m)   Wt 198 lb (89.8 kg)   Breastfeeding? Yes   BMI 31.01 kg/m  Skin warm and dry.  Lungs: clear to ausculation bilaterally. Cardiovascular: regular rate and rhythm. Breasts:no dominate palpable mass, retraction or nipple discharge,but does have some lumpy areas, and when massaged more white breast milk at nipples.  Postpartum depression score 21, denies being suicidal.Discussed pumping every 2 hours and massaging breasts and trying fenugreek.Take time for self and ask for help, and will also rx lexapro.    Face time 15 minutes with 50 % counseling.  Assessment:     1. Pain of breast during breastfeeding   2. Postpartum depression   3. Hypertension, unspecified type       Plan:     Meds ordered this encounter  Medications  . escitalopram (LEXAPRO) 10 MG tablet    Sig: Take 1 tablet (10 mg total) by mouth daily.    Dispense:  30 tablet    Refill:  6    Order Specific Question:   Supervising Provider    Answer:   Tania Ade H [[id number]]  Try Fenugreek and mother's tea Increase water and fluids Massage breasts and use warm compresses or shower  Continue BP meds  Follow up in 1 week

## 2017-02-12 ENCOUNTER — Ambulatory Visit: Payer: BLUE CROSS/BLUE SHIELD | Admitting: Adult Health

## 2017-02-13 ENCOUNTER — Ambulatory Visit: Payer: Self-pay

## 2017-02-13 ENCOUNTER — Telehealth: Payer: Self-pay | Admitting: *Deleted

## 2017-02-13 NOTE — Lactation Note (Signed)
This note was copied from a baby's chart. Lactation Consult  Mother's reason for visit:  Painful latch, feeding assessment Visit Type:  Outpatient Appointment Notes:  56 day old baby that was born at 30 weeks but was < 6 lbs.  Once mother got home she was breastfeeding and pumping approx 8 oz 2-3 times a day but latch was extremely painful, burning.  Mother has stopped breastfeeding had exclusively pumped approx 8 times per day until soreness subsided.  As of yesterday 5/8 baby went back to breastfeeding and mother pumped approx 2 times yesterday and yielded approx 2.5-3 oz.  She was worried about her milk supply and started taking 3 capsules of fenugreek per day. Mother had gastric sleeve approx 2 years ago.  Now that she is back to breastfeeding, she states she has initial discomfort but then pain subsides and she can once again tolerate breastfeeding.   Plan:  Hand express before breastfeeding.  Latch 8-12 times per day focus on depth.  Wake baby between breasts with burping etc. Post pump 4-5 times per day for 15-20 min and give baby back volume pumped after next breastfeeding session until baby has sustained weight gain of 1/2 - 1 oz per day. Mother will weigh at home since she has personal infant scale. Hand express before and after pumping.  Watch hands on pumping video. Mother will increase fenugreek capsules per day. Provided mother with two week DEBP rental. Recommend mother consult her MD regarding her nutritional status and inquire about possible vitamin/mineral supplements. Discussed the use of A.P.N.O if pain returns. Adjust L nipple flange size to possibly #27.   Consult:  Initial Lactation Consultant:  Allison [name]  ________________________________________________________________________ Allison [name] Name:  Allison [name] Date of Birth:  1985-12-19 Pediatrician:  Allison [name] Gender:  female Gestational Age: <None> (At Birth) Birth Weight:    Weight at Discharge:   Weight: 3552 oz                      Date of Discharge:  01/27/2017    Filed Weights   01/24/17 1107  Weight: 3552 oz  Last weight taken from location outside of [facility]:  6 lb 2.5 oz    Location:Pediatrician's office Weight today:  6 lb 2.8 oz ________________________________________________________________________  Mother's Name: Allison [name] Type of delivery:  VBAC, Spontaneous Breastfeeding Experience:  P2, Other child was in NICU and she exclusively pumped. This is first time breastfeeding experience. Maternal Medical Conditions:  Gastric bypass Maternal Medications:  PNV, lexapro, fenugreek  ________________________________________________________________________  Breastfeeding History (Post Discharge)  Frequency of breastfeeding:  approx q 2.5 hours  Duration of feeding:  15-30 min.    Pumping  Type of pump:  Ameda Frequency:  Until she started re-latching baby she was pumping every 2.5 hours. Volume:  90 ml  Infant Intake and Output Assessment  Voids:  8-12 in 24 hrs.  Color:  Clear yellow Stools:  6-8 in 24 hrs.  Color:  Yellow  ________________________________________________________________________  Maternal Breast Assessment  Breast:  Soft Nipple:  Erect Pain level:  1 Pain interventions:  All purpose nipple cream and Expressed breast milk  _______________________________________________________________________ Feeding Assessment/Evaluation  Initial feeding assessment:  Infant's oral assessment:  WNL  Positioning:  Football Right breast  LATCH documentation:  Latch:  2 = Grasps breast easily, tongue down, lips flanged, rhythmical sucking.  Audible swallowing:  2 = Spontaneous and intermittent  Type of nipple:  2 = Everted at rest and after  stimulation  Comfort (Breast/Nipple):  2 = Soft / non-tender  Hold (Positioning):  2 = No assistance needed to correctly position infant at breast  LATCH score:  10  Attached assessment:   Deep  Lips flanged:  Yes.    Lips untucked:  No.  Suck assessment:  Displays both  Tools:  Pump Instructed on use and cleaning of tool:  Yes.    Pre-feed weight:  2802 g  (6 lb. 2.8 oz.) Post-feed weight:  2850 g (6  lb. 4.5 oz.) Amount transferred:  48 ml  Additional Feeding Assessment -   Infant's oral assessment:  WNL  Positioning:  Football Left breast  LATCH documentation:  Latch:  2 = Grasps breast easily, tongue down, lips flanged, rhythmical sucking.  Audible swallowing:  1 = A few with stimulation  Type of nipple:  2 = Everted at rest and after stimulation  Comfort (Breast/Nipple):  2 = Soft / non-tender  Hold (Positioning):  2 = No assistance needed to correctly position infant at breast  LATCH score:  9   Attached assessment:  Shallow  Lips flanged:  Yes.    Lips untucked:  No.  Suck assessment:  Displays both  Tools:  Pump Instructed on use and cleaning of tool:  Yes.    Post-feed weight:  2850 g (6 lb. 4.5 oz.) Amount transferred:  4 ml Infant's diaper was changed in between feedings.  So end weight does not take into consideration of the stool "Allison [name]" had.  Total amount transferred:  52 ml  - amount not accurate due to dirty diaper.  Baby would not re-latch until diaper was changed.

## 2017-02-13 NOTE — Telephone Encounter (Signed)
APNO with ibuprofen called to [facility] in [place], as per lactation consult recommendations ,use tid prn and wipe off before feeding, 1oz(30Gm) with 1 refill,pt aware it has been called in.

## 2017-02-18 ENCOUNTER — Ambulatory Visit: Payer: BLUE CROSS/BLUE SHIELD | Admitting: Women's Health

## 2017-02-18 ENCOUNTER — Ambulatory Visit (INDEPENDENT_AMBULATORY_CARE_PROVIDER_SITE_OTHER): Payer: BLUE CROSS/BLUE SHIELD | Admitting: Adult Health

## 2017-02-18 ENCOUNTER — Encounter: Payer: Self-pay | Admitting: Adult Health

## 2017-02-18 VITALS — BP 132/88 | HR 66 | Ht 67.0 in | Wt 201.5 lb

## 2017-02-18 DIAGNOSIS — F53 Postpartum depression: Secondary | ICD-10-CM

## 2017-02-18 DIAGNOSIS — I10 Essential (primary) hypertension: Secondary | ICD-10-CM | POA: Diagnosis not present

## 2017-02-18 DIAGNOSIS — F419 Anxiety disorder, unspecified: Secondary | ICD-10-CM

## 2017-02-18 DIAGNOSIS — O99345 Other mental disorders complicating the puerperium: Principal | ICD-10-CM

## 2017-02-18 MED ORDER — AMLODIPINE BESYLATE 5 MG PO TABS: 5.0000 mg | ORAL_TABLET | Freq: Every day | ORAL | 1 refills | Status: DC

## 2017-02-18 NOTE — Progress Notes (Signed)
Subjective:     Patient ID: Allison [name], female   DOB: Aug 11, 1986, 31 y.o.   MRN: [id number]  HPI Rella is a 31 year old white female back in follow up of starting lexapro and feels better, but is anxious at times.She stopped breast feeding and breasts feel better.She stopped norvasc and has had some headaches at times.  Review of Systems Anxious Headaches at times  Reviewed past medical,surgical, social and family history. Reviewed medications and allergies.     Objective:   Physical Exam BP 132/88 (BP Location: Left Arm, Patient Position: Sitting, Cuff Size: Normal)   Pulse 66   Ht 5\' 7"  (1.702 m)   Wt 201 lb 8 oz (91.4 kg)   Breastfeeding? No   BMI 31.56 kg/m  Skin warm and dry.  Lungs: clear to ausculation bilaterally. Cardiovascular: regular rate and rhythm.   PHQ 9 score 13, which is down form 21 last week. Will resume Norvasc and F/U in 1 week.  Assessment:     1. Postpartum depression   2. Anxiety   3. Hypertension, unspecified type       Plan:     Take Norvasc 5 mg daily Continue lexapro  Wear tight bra F/U in 1 week, if still anxious will add buspar to lexapro.

## 2017-02-26 ENCOUNTER — Ambulatory Visit (INDEPENDENT_AMBULATORY_CARE_PROVIDER_SITE_OTHER): Payer: BLUE CROSS/BLUE SHIELD | Admitting: Adult Health

## 2017-02-26 ENCOUNTER — Encounter: Payer: Self-pay | Admitting: Adult Health

## 2017-02-26 VITALS — BP 120/82 | HR 90 | Ht 67.0 in | Wt 199.0 lb

## 2017-02-26 DIAGNOSIS — I10 Essential (primary) hypertension: Secondary | ICD-10-CM | POA: Diagnosis not present

## 2017-02-26 DIAGNOSIS — F53 Postpartum depression: Secondary | ICD-10-CM

## 2017-02-26 DIAGNOSIS — O99345 Other mental disorders complicating the puerperium: Principal | ICD-10-CM

## 2017-02-26 MED ORDER — BUPROPION HCL ER (SR) 150 MG PO TB12: 150.0000 mg | ORAL_TABLET | Freq: Every day | ORAL | 6 refills | Status: DC

## 2017-02-26 NOTE — Progress Notes (Signed)
Subjective:     Patient ID: Allison [name], female   DOB: September 01, 1986, 31 y.o.   MRN: [id number]  HPI Allison [name] is a 31 year old, married, G2P2 in for BP check and F/U on depression, and being anxious. .   Review of Systems Cravings for cigarettes Feels less anxious Reviewed past medical,surgical, social and family history. Reviewed medications and allergies.     Objective:   Physical Exam BP 120/82 (BP Location: Left Arm, Patient Position: Sitting, Cuff Size: Normal)   Pulse 90   Ht 5\' 7"  (1.702 m)   Wt 199 lb (90.3 kg)   Breastfeeding? No   BMI 31.17 kg/m  Skin warm and dry. Lungs: clear to ausculation bilaterally. Cardiovascular: regular rate and rhythm.   PP depression score 8, PHQ ( score last week was 13, is feeling less anxious, but is having cravings for cigarettes, requests to try Wellbutrin for depression and see if helps with cravings, will do.She seems much happier today and has both boys with her.  Assessment:     1. Postpartum depression   2. Hypertension, unspecified type       Plan:     Stop lexapro Rx Wellbutrin SR 150 mg 1 daily #30 with 6 refills Keep taking norvasc but stop 5/30 and return in 6/1 for  postpartum visit with Maudie Mercury

## 2017-03-08 ENCOUNTER — Ambulatory Visit: Payer: BLUE CROSS/BLUE SHIELD | Admitting: Women's Health

## 2017-03-13 ENCOUNTER — Ambulatory Visit: Payer: BLUE CROSS/BLUE SHIELD | Admitting: Advanced Practice Midwife

## 2017-03-14 ENCOUNTER — Ambulatory Visit (INDEPENDENT_AMBULATORY_CARE_PROVIDER_SITE_OTHER): Payer: BLUE CROSS/BLUE SHIELD | Admitting: Women's Health

## 2017-03-14 ENCOUNTER — Encounter: Payer: Self-pay | Admitting: Women's Health

## 2017-03-14 DIAGNOSIS — Z8759 Personal history of other complications of pregnancy, childbirth and the puerperium: Secondary | ICD-10-CM | POA: Diagnosis not present

## 2017-03-14 NOTE — Progress Notes (Signed)
Subjective:    Allison [name] is a 31 y.o. G24P2002 Caucasian female who presents for a postpartum visit. She is 6 weeks postpartum following a spontaneous vaginal delivery at 38.0 gestational weeks after IOL for GHTN. Anesthesia: epidural. I have fully reviewed the prenatal and intrapartum course. Postpartum course has been complicated by pp HTN requiring norvasc, which she stopped ~1wk ago. Also had PPD, was on Lexapro then switched to Wellbutrin b/c she had been on it in past and liked it. States she stopped it not long after starting b/c it made her feel bad. Feels she is doing fine off of it. Denies SI/HI/II. Baby's course has been uncomplicated. Baby is feeding by bottle. Bleeding started period yesterday. Bowel function is normal. Bladder function is normal. Patient is sexually active. Last sexual activity: few days ago. Contraception method is condoms (did not use few days ago).  Postpartum depression screening: negative. Score 9.  Last pap 07/2016 and was neg.  The following portions of the patient's history were reviewed and updated as appropriate: allergies, current medications, past medical history, past surgical history and problem list.  Review of Systems Pertinent items are noted in HPI.   Vitals:   03/14/17 1351  BP: 118/78  Pulse: 79  Weight: 206 lb (93.4 kg)   Patient's last menstrual period was 03/13/2017 (exact date).  Objective:   General:  alert, cooperative and no distress   Breasts:  deferred, no complaints  Lungs: clear to auscultation bilaterally  Heart:  regular rate and rhythm  Abdomen: soft, nontender   Vulva: normal  Vagina: normal vagina  Cervix:  closed  Corpus: Well-involuted  Adnexa:  Non-palpable  Rectal Exam: No hemorrhoids        Assessment:   Postpartum exam 6 wks s/p SVB after IOL for GHTN Resolved pp HTN Bottlefeeding Depression screening Contraception counseling   Plan:  Contraception: condoms Follow up in: Oct  for physical, or  earlier if needed  Tawnya Crook CNM, [facility] 03/14/2017 2:25 PM

## 2017-03-14 NOTE — Patient Instructions (Signed)
Postpartum Depression and Baby Blues The postpartum period begins right after the birth of a baby. During this time, there is often a great amount of joy and excitement. It is also a time of many changes in the life of the parents. Regardless of how many times a mother gives birth, each child brings new challenges and dynamics to the family. It is not unusual to have feelings of excitement along with confusing shifts in moods, emotions, and thoughts. All mothers are at risk of developing postpartum depression or the "baby blues." These mood changes can occur right after giving birth, or they may occur many months after giving birth. The baby blues or postpartum depression can be mild or severe. Additionally, postpartum depression can go away rather quickly, or it can be a long-term condition. What are the causes? Raised hormone levels and the rapid drop in those levels are thought to be a main cause of postpartum depression and the baby blues. A number of hormones change during and after pregnancy. Estrogen and progesterone usually decrease right after the delivery of your baby. The levels of thyroid hormone and various cortisol steroids also rapidly drop. Other factors that play a role in these mood changes include major life events and genetics. What increases the risk? If you have any of the following risks for the baby blues or postpartum depression, know what symptoms to watch out for during the postpartum period. Risk factors that may increase the likelihood of getting the baby blues or postpartum depression include:  Having a personal or family history of depression.  Having depression while being pregnant.  Having premenstrual mood issues or mood issues related to oral contraceptives.  Having a lot of life stress.  Having marital conflict.  Lacking a social support network.  Having a baby with special needs.  Having health problems, such as diabetes.  What are the signs or  symptoms? Symptoms of baby blues include:  Brief changes in mood, such as going from extreme happiness to sadness.  Decreased concentration.  Difficulty sleeping.  Crying spells, tearfulness.  Irritability.  Anxiety.  Symptoms of postpartum depression typically begin within the first month after giving birth. These symptoms include:  Difficulty sleeping or excessive sleepiness.  Marked weight loss.  Agitation.  Feelings of worthlessness.  Lack of interest in activity or food.  Postpartum psychosis is a very serious condition and can be dangerous. Fortunately, it is rare. Displaying any of the following symptoms is cause for immediate medical attention. Symptoms of postpartum psychosis include:  Hallucinations and delusions.  Bizarre or disorganized behavior.  Confusion or disorientation.  How is this diagnosed? A diagnosis is made by an evaluation of your symptoms. There are no medical or lab tests that lead to a diagnosis, but there are various questionnaires that a health care provider may use to identify those with the baby blues, postpartum depression, or psychosis. Often, a screening tool called the [place] Postnatal Depression Scale is used to diagnose depression in the postpartum period. How is this treated? The baby blues usually goes away on its own in 1-2 weeks. Social support is often all that is needed. You will be encouraged to get adequate sleep and rest. Occasionally, you may be given medicines to help you sleep. Postpartum depression requires treatment because it can last several months or longer if it is not treated. Treatment may include individual or group therapy, medicine, or both to address any social, physiological, and psychological factors that may play a role in the  depression. Regular exercise, a healthy diet, rest, and social support may also be strongly recommended. Postpartum psychosis is more serious and needs treatment right away.  Hospitalization is often needed. Follow these instructions at home:  Get as much rest as you can. Nap when the baby sleeps.  Exercise regularly. Some women find yoga and walking to be beneficial.  Eat a balanced and nourishing diet.  Do little things that you enjoy. Have a cup of tea, take a bubble bath, read your favorite magazine, or listen to your favorite music.  Avoid alcohol.  Ask for help with household chores, cooking, grocery shopping, or running errands as needed. Do not try to do everything.  Talk to people close to you about how you are feeling. Get support from your partner, family members, friends, or other new moms.  Try to stay positive in how you think. Think about the things you are grateful for.  Do not spend a lot of time alone.  Only take over-the-counter or prescription medicine as directed by your health care provider.  Keep all your postpartum appointments.  Let your health care provider know if you have any concerns. Contact a health care provider if: You are having a reaction to or problems with your medicine. Get help right away if:  You have suicidal feelings.  You think you may harm the baby or someone else. This information is not intended to replace advice given to you by your health care provider. Make sure you discuss any questions you have with your health care provider. Document Released: 06/28/2004 Document Revised: 03/01/2016 Document Reviewed: 07/06/2013 Elsevier Interactive Patient Education  2017 Elsevier Inc.  

## 2017-08-31 IMAGING — DX DG WRIST COMPLETE 3+V*R*
4 series · 4 of 4 positions shown · non-contrast
Comparison: None.

CLINICAL DATA: Dog bites to the right arm.  Pain.

EXAM:
RIGHT WRIST - COMPLETE 3+ VIEW

[wrist pa (1 of 2)]
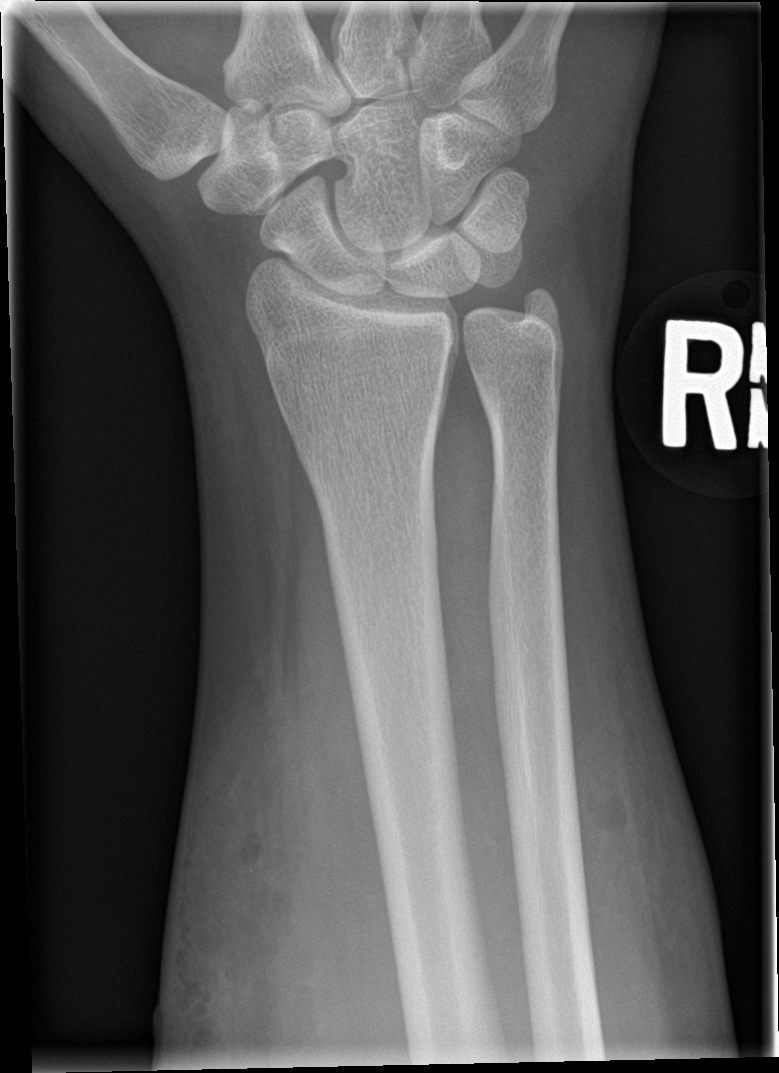

[wrist obl]
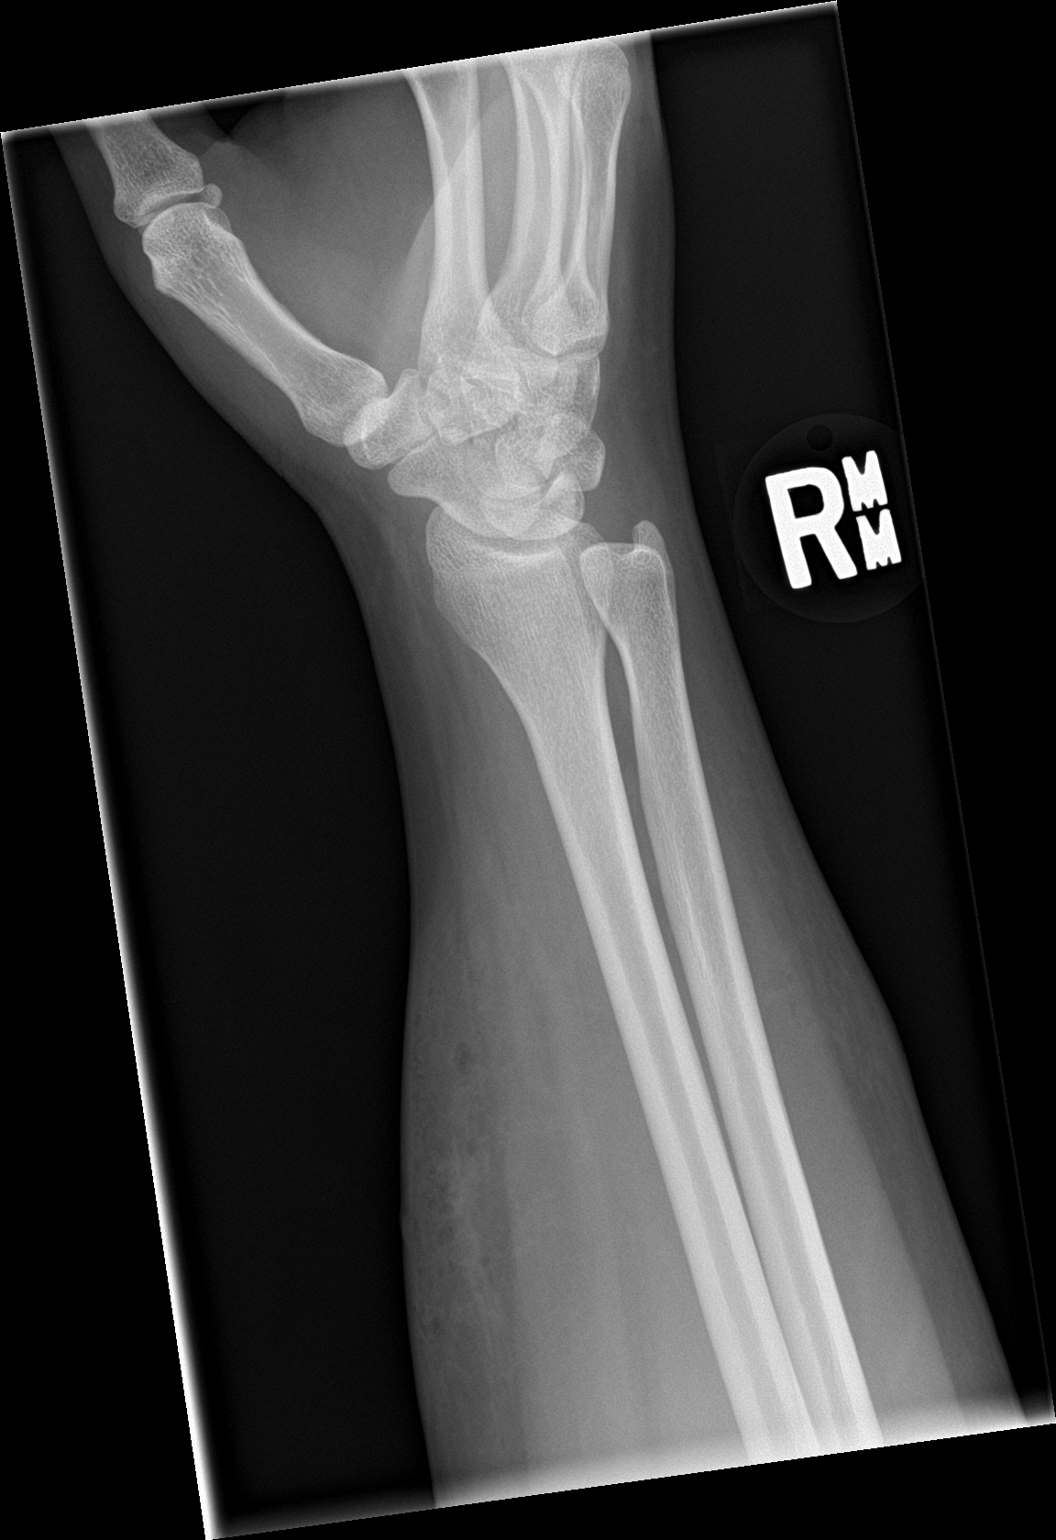

[wrist lat]
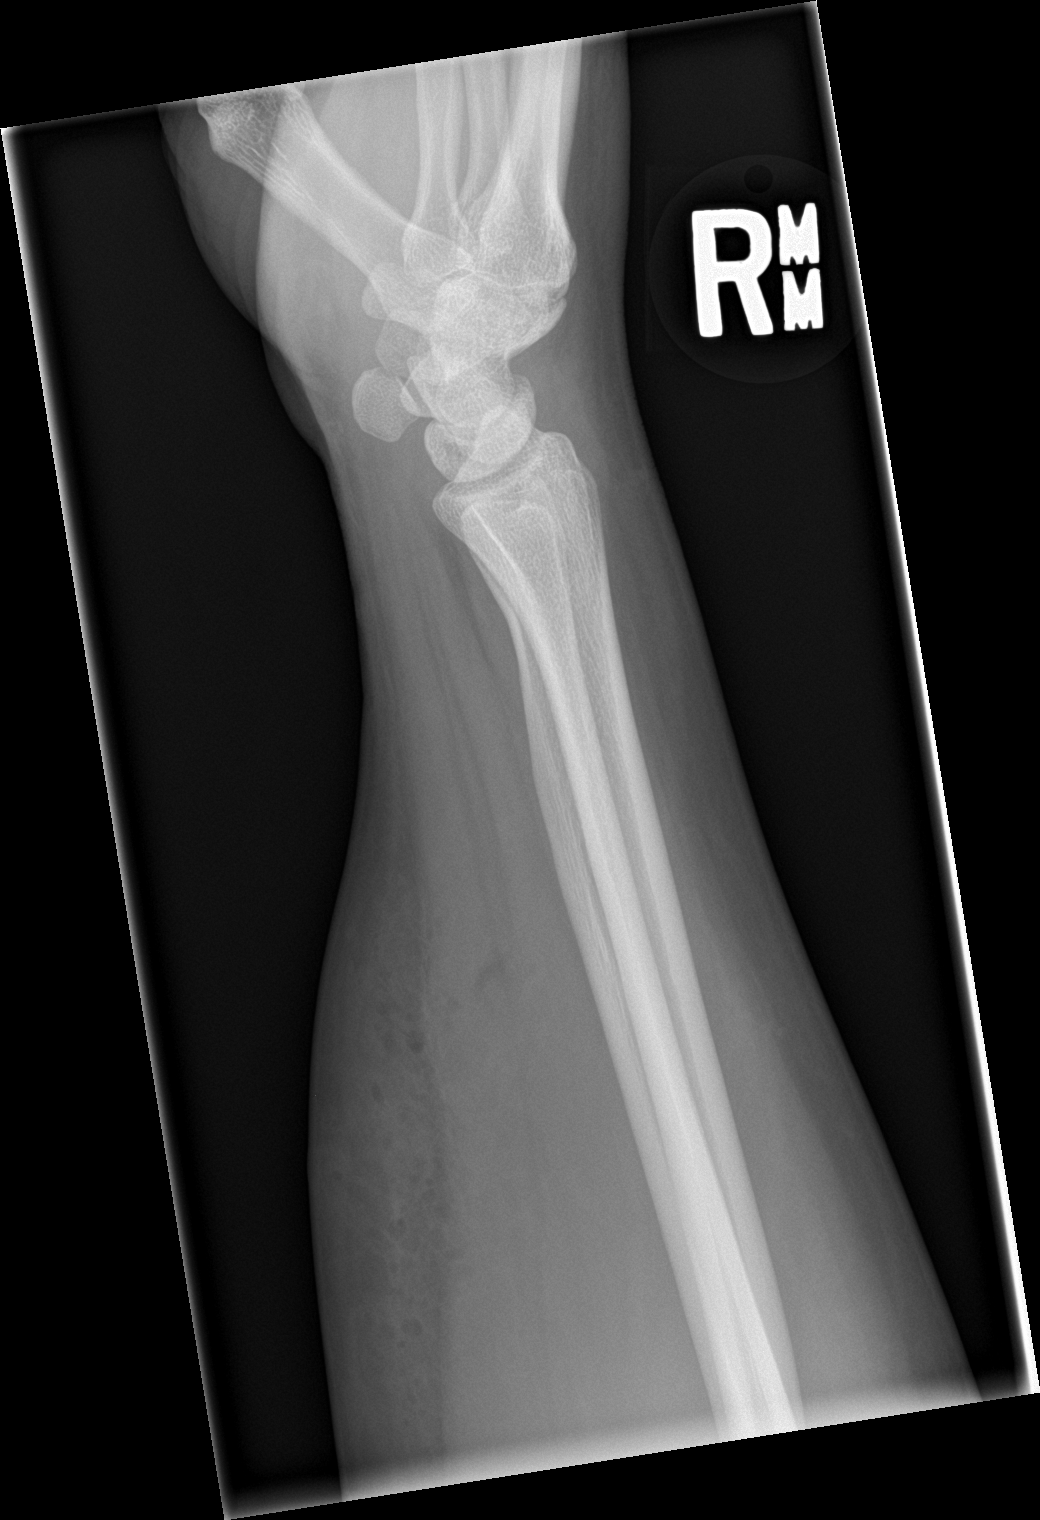

[wrist pa (2 of 2)]
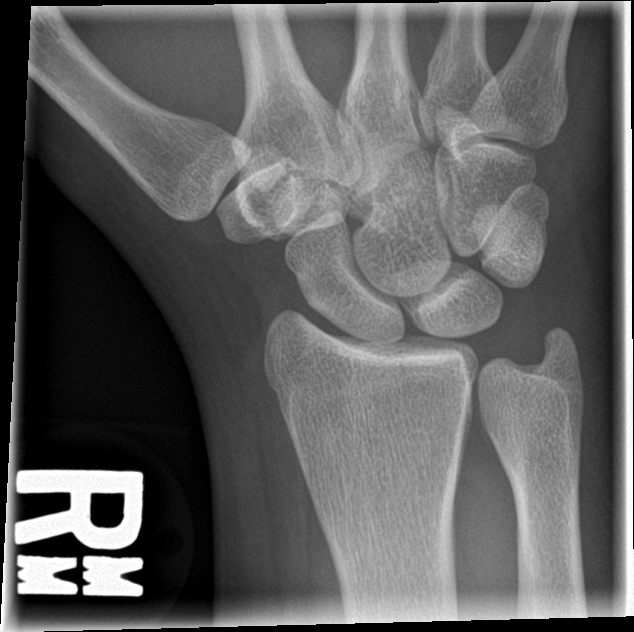

[4 of 4 positions shown; findings below may reference images not displayed]

FINDINGS: There is no evidence of fracture or dislocation. There is no
evidence of arthropathy or other focal bone abnormality. There is
soft tissue swelling and soft tissue emphysema along the volar
aspect of the right distal forearm.
IMPRESSION: No acute osseous injury of the right wrist.

Soft tissue swelling and soft tissue emphysema along the volar
aspect of the right distal forearm.

## 2017-12-20 ENCOUNTER — Encounter (HOSPITAL_COMMUNITY): Payer: Self-pay

## 2018-02-28 ENCOUNTER — Ambulatory Visit: Payer: 59 | Admitting: Women's Health

## 2018-02-28 ENCOUNTER — Encounter: Payer: Self-pay | Admitting: Women's Health

## 2018-02-28 VITALS — BP 128/70 | HR 84 | Ht 67.0 in | Wt 225.0 lb

## 2018-02-28 DIAGNOSIS — N920 Excessive and frequent menstruation with regular cycle: Secondary | ICD-10-CM | POA: Diagnosis not present

## 2018-02-28 DIAGNOSIS — B373 Candidiasis of vulva and vagina: Secondary | ICD-10-CM

## 2018-02-28 DIAGNOSIS — B3731 Acute candidiasis of vulva and vagina: Secondary | ICD-10-CM

## 2018-02-28 MED ORDER — FLUCONAZOLE 150 MG PO TABS: 150.0000 mg | ORAL_TABLET | Freq: Once | ORAL | 0 refills | Status: AC

## 2018-02-28 NOTE — Progress Notes (Signed)
   GYN VISIT Patient name: Allison [name] MRN [id number]  Date of birth: [birth year] Chief Complaint:   Menorrhagia  History of Present Illness:   Allison [name] is a 32 y.o. G70P2002 Caucasian female being seen today for report of heavy periods since birth of child a little over a year ago. Periods are regular, last 7d, wears ultra tampon and pad and changes q 2hr, soils clothes 'all the time', no clots. Cramps before period starts, none during period. Extreme anxiety week before period starts. Migraines during period. Declines gc/ct. Seems to get yeast infection w/ q period, uses monistat and doesn't help. Lots of chin hairs.      Patient's last menstrual period was 02/14/2018 (approximate). The current method of family planning is condoms. Last pap 07/25/16. Results were:  normal Review of Systems:   Pertinent items are noted in HPI Denies fever/chills, dizziness, headaches, visual disturbances, fatigue, shortness of breath, chest pain, abdominal pain, vomiting, abnormal vaginal discharge/itching/odor/irritation, problems with periods, bowel movements, urination, or intercourse unless otherwise stated above.  Pertinent History Reviewed:  Reviewed past medical,surgical, social, obstetrical and family history.  Reviewed problem list, medications and allergies. Physical Assessment:   Vitals:   02/28/18 1116  BP: 128/70  Pulse: 84  Weight: 225 lb (102.1 kg)  Height: 5\' 7"  (1.702 m)  Body mass index is 35.24 kg/m.       Physical Examination:   General appearance: alert, well appearing, and in no distress  Mental status: alert, oriented to person, place, and time  Skin: warm & dry   Cardiovascular: normal heart rate noted  Respiratory: normal respiratory effort, no distress  Abdomen: soft, non-tender   Pelvic: VULVA: normal appearing vulva with no masses, tenderness or lesions, VAGINA: normal appearing vagina with normal color and thick white clumpy nonodorous discharge c/w yeast,  no lesions, CERVIX: normal appearing cervix without discharge or lesions, UTERUS: uterus is normal size, shape, consistency and nontender, ADNEXA: normal adnexa in size, nontender and no masses  Extremities: no edema   No results found for this or any previous visit (from the past 24 hour(s)).  Assessment & Plan:  1) Menorrhagia w/ regular cycle and PMD> check cbc, tsh, will get pelvic u/s after next period-pt to call when next period starts, discuss management after that. Hesitant about contraception.   2) Vaginal candida> rx diflucan  Meds:  Meds ordered this encounter  Medications  . fluconazole (DIFLUCAN) 150 MG tablet    Sig: Take 1 tablet (150 mg total) by mouth once for 1 dose. Take 1 pill now, may take 2nd pill in 3 days if needed    Dispense:  2 tablet    Refill:  0    Order Specific Question:   Supervising Provider    Answer:   Florian Buff [[id number]]    Orders Placed This Encounter  Procedures  . US PELVIS (TRANSABDOMINAL ONLY)  . US PELVIS TRANSVANGINAL NON-OB (TV ONLY)  . CBC  . TSH    Return for pt to call when next period starts to schedule pelvic u/s for when period is off, f/u next day.  [place], [facility] 02/28/2018 11:46 AM

## 2018-02-28 NOTE — Patient Instructions (Signed)
Call when your next period starts to schedule an ultrasound for 1 week later, then the next day for a follow up visit

## 2018-03-01 LAB — CBC
Hematocrit: 46.6 % (ref 34.0–46.6)
Hemoglobin: 15.5 g/dL (ref 11.1–15.9)
MCH: 29.2 pg (ref 26.6–33.0)
MCHC: 33.3 g/dL (ref 31.5–35.7)
MCV: 88 fL (ref 79–97)
Platelets: 209 10*3/uL (ref 150–450)
RBC: 5.31 x10E6/uL — ABNORMAL HIGH (ref 3.77–5.28)
RDW: 14.2 % (ref 12.3–15.4)
WBC: 9.8 10*3/uL (ref 3.4–10.8)

## 2018-03-01 LAB — TSH: TSH: 1.95 u[IU]/mL (ref 0.450–4.500)

## 2018-03-12 ENCOUNTER — Ambulatory Visit (INDEPENDENT_AMBULATORY_CARE_PROVIDER_SITE_OTHER): Payer: 59

## 2018-03-12 DIAGNOSIS — N920 Excessive and frequent menstruation with regular cycle: Secondary | ICD-10-CM

## 2018-03-12 NOTE — Progress Notes (Signed)
PELVIC US TA/TV: homogeneous anteverted uterus,wnl,EEC 6.5 mm,normal ovaries bilat,ovaries appear mobile,no free fluid

## 2018-03-14 ENCOUNTER — Ambulatory Visit: Payer: 59 | Admitting: Women's Health

## 2018-03-20 ENCOUNTER — Ambulatory Visit: Payer: 59 | Admitting: Women's Health

## 2018-03-31 ENCOUNTER — Ambulatory Visit: Payer: 59 | Admitting: Women's Health

## 2020-02-19 ENCOUNTER — Other Ambulatory Visit: Payer: Self-pay

## 2020-02-19 ENCOUNTER — Encounter: Payer: Self-pay | Admitting: Plastic Surgery

## 2020-02-19 ENCOUNTER — Ambulatory Visit (INDEPENDENT_AMBULATORY_CARE_PROVIDER_SITE_OTHER): Payer: Self-pay | Admitting: Plastic Surgery

## 2020-02-19 DIAGNOSIS — Z719 Counseling, unspecified: Secondary | ICD-10-CM | POA: Insufficient documentation

## 2020-02-19 NOTE — Progress Notes (Signed)
Patient ID: Allison [name], female    DOB: Dec 24, 1985, 34 y.o.   MRN: [id number]   Chief Complaint  Patient presents with  . Consult    excess skin/tummy tuck thigh lift and breast reduction    The patient is a 34 year old white female here for evaluation of her abdomen.  She underwent a gastric bypass in 2017.  She is 5 feet 6 inches tall.  She was originally 335 pounds and is now 173 pounds.  She is currently smoking.  She is otherwise in good health and not a diabetic.  Her bra size is a 36-38 DDD.  She is also interested in a breast lift and reduction.  She thinks she might like a D size bra.  Her main concern today is her abdomen.  She is not planning on having any more children she has 2 already.  She does not have a family history of breast cancer.  She complains of neck pain and back pain.  She had some abdominal hernias repaired when she underwent her gastric surgery.  Her weight has been stable for the past year.   Review of Systems  Constitutional: Positive for activity change. Negative for appetite change.  HENT: Negative.   Eyes: Negative.   Respiratory: Negative.  Negative for chest tightness and shortness of breath.   Cardiovascular: Negative.   Gastrointestinal: Negative.  Negative for abdominal distention and abdominal pain.  Genitourinary: Negative.   Musculoskeletal: Positive for back pain and neck pain.  Neurological: Negative.   Hematological: Negative.   Psychiatric/Behavioral: Negative.     Past Medical History:  Diagnosis Date  . Asthma     Albuterol as needed  . Depression    has been on meds in past but after had child then everything is fine  . Family history of adverse reaction to anesthesia    grandma gets very sick  . History of bronchitis 2016  . Migraine   . PONV (postoperative nausea and vomiting)    gets very sick and very depressed    Past Surgical History:  Procedure Laterality Date  . BREATH TEK H PYLORI N/A 02/04/2015   Procedure:  BREATH TEK H PYLORI;  Surgeon: Greer Pickerel, MD;  Location: [facility];  Service: General;  Laterality: N/A;  . CESAREAN SECTION  07/03/2012   Procedure: CESAREAN SECTION;  Surgeon: Ena Dawley, MD;  Location: [facility];  Service: Obstetrics;  Laterality: N/A;  . CHOLECYSTECTOMY N/A 03/22/2016   Procedure: LAPAROSCOPIC CHOLECYSTECTOMY WITH INTRAOPERATIVE CHOLANGIOGRAM;  Surgeon: Greer Pickerel, MD;  Location: [facility];  Service: General;  Laterality: N/A;  . LAPAROSCOPIC GASTRIC SLEEVE RESECTION WITH HIATAL HERNIA REPAIR N/A 05/23/2015   Procedure: LAPAROSCOPIC GASTRIC SLEEVE RESECTION WITH  HIATAL HERNIA REPAIR AND UPPER ENDOSCOPY;  Surgeon: Greer Pickerel, MD;  Location: [facility];  Service: General;  Laterality: N/A;  . TONSILLECTOMY    . UPPER GI ENDOSCOPY  05/23/2015   Procedure: UPPER GI ENDOSCOPY;  Surgeon: Greer Pickerel, MD;  Location: [facility];  Service: General;;  . WISDOM TOOTH EXTRACTION  2007     No current outpatient medications on file.   Objective:   Vitals:   02/19/20 1201  BP: 139/81  Pulse: (!) 59  Temp: 97.8 F (36.6 C)  SpO2: 99%    Physical Exam Vitals and nursing note reviewed.  Constitutional:      Appearance: Normal appearance.  HENT:     Head: Normocephalic and atraumatic.  Cardiovascular:     Rate and  Rhythm: Normal rate.     Pulses: Normal pulses.  Pulmonary:     Effort: Pulmonary effort is normal. No respiratory distress.  Abdominal:     General: Abdomen is flat. There is no distension.     Tenderness: There is no abdominal tenderness.     Hernia: No hernia is present.  Skin:    General: Skin is warm.     Capillary Refill: Capillary refill takes less than 2 seconds.  Neurological:     General: No focal deficit present.     Mental Status: She is alert and oriented to person, place, and time.  Psychiatric:        Mood and Affect: Mood normal.        Behavior: Behavior normal.        Thought Content: Thought content normal.     Assessment & Plan:    Encounter for counseling  The patient is a good candidate for an abdominoplasty with or without liposuction.  We talked about some of the risks and complications.  The main thing right now is that she be tobacco free for 6 weeks prior to any surgery.  I do not recommend doing our breast surgery and abdominal surgery at the same time.  The patient acknowledges understanding this information.  We will get her a quote.  Pictures were obtained of the patient and placed in the chart with the patient's or guardian's permission.  [place], DO

## 2020-05-12 ENCOUNTER — Telehealth: Payer: Self-pay | Admitting: Family Medicine

## 2020-05-12 NOTE — Telephone Encounter (Signed)
Patient called wanting to get re-established with Dr.Duncan. She hasnt seen him since 2012 but she states her family still sees him and she would love to become one of his patients again.

## 2020-05-13 NOTE — Telephone Encounter (Signed)
Called patient and got her set up with re-establishment appointment with Dr.Duncan on 8/30.

## 2020-05-13 NOTE — Telephone Encounter (Signed)
2min ov when possible.  Thanks.

## 2020-06-06 ENCOUNTER — Ambulatory Visit (INDEPENDENT_AMBULATORY_CARE_PROVIDER_SITE_OTHER): Payer: 59 | Admitting: Family Medicine

## 2020-06-06 ENCOUNTER — Encounter: Payer: Self-pay | Admitting: Family Medicine

## 2020-06-06 ENCOUNTER — Other Ambulatory Visit: Payer: Self-pay

## 2020-06-06 VITALS — BP 126/76 | HR 92 | Temp 97.0°F | Ht 66.0 in | Wt 174.5 lb

## 2020-06-06 DIAGNOSIS — D179 Benign lipomatous neoplasm, unspecified: Secondary | ICD-10-CM | POA: Diagnosis not present

## 2020-06-06 DIAGNOSIS — F419 Anxiety disorder, unspecified: Secondary | ICD-10-CM

## 2020-06-06 DIAGNOSIS — N920 Excessive and frequent menstruation with regular cycle: Secondary | ICD-10-CM | POA: Diagnosis not present

## 2020-06-06 DIAGNOSIS — Z7189 Other specified counseling: Secondary | ICD-10-CM

## 2020-06-06 DIAGNOSIS — L680 Hirsutism: Secondary | ICD-10-CM

## 2020-06-06 DIAGNOSIS — F17200 Nicotine dependence, unspecified, uncomplicated: Secondary | ICD-10-CM

## 2020-06-06 NOTE — Patient Instructions (Signed)
Likely reasonable to start lexapro but I need to see you labs first.  Go to the lab on the way out.   If you have mychart we'll likely use that to update you.    We'll go from there.  Thanks for your effort.

## 2020-06-06 NOTE — Progress Notes (Signed)
This visit occurred during the SARS-CoV-2 public health emergency.  Safety protocols were in place, including screening questions prior to the visit, additional usage of staff PPE, and extensive cleaning of exam room while observing appropriate contact time as indicated for disinfecting solutions.  Smoking cessation dw pt.  1.5 PPD. [place].  Discussed options.   chantix cautions d/w pt. it likely makes sense to address other issues first.  See below.  We did talk about nicotine replacement as that may be an option for her, use the patch later on.  Taking phentermine rarely.  It made anxiety worse.  H/o sig intentional weight loss.  She talked with plastic surgery.  No SI/HI.  She has more anxiety than depression.  She has tachycardia related to scheduling this appointment/stressors.  No etoh.  Stressors at home discussed with patient.  Daily sx.  Tearful.  Feels overwhelmed.  She can hyperventilate and get nauseated.  Prev on lexapro, wellbutrin, zoloft, off meds for years.  Safe at home.    She had seen counselors prev.  It helped some.  D/w pt about options.  She can update me as needed, if she needs referral for counseling.  She had her covid vaccine.  D/w pt.    She has some hirsutism noted.  She had h/o heavy menses.    Advance directive d/w pt. Mother designated if patient were incapacitated.    Small lipoma on the L forehead.  Present for long period of time.    Meds, vitals, and allergies reviewed.   ROS: Per HPI unless specifically indicated in ROS section   GEN: nad, alert and oriented HEENT: ncat, with small likely lipoma noted on the left side of the forehead.  Does not appear irritated.   NECK: supple w/o LA CV: rrr. PULM: ctab, no inc wob ABD: soft, +bs EXT: no edema SKIN: no acute rash Speech and affect normal.  No tremor.

## 2020-06-07 LAB — CBC WITH DIFFERENTIAL/PLATELET
Basophils Absolute: 0.1 10*3/uL (ref 0.0–0.1)
Basophils Relative: 1.1 % (ref 0.0–3.0)
Eosinophils Absolute: 0.3 10*3/uL (ref 0.0–0.7)
Eosinophils Relative: 4.1 % (ref 0.0–5.0)
HCT: 41.7 % (ref 36.0–46.0)
Hemoglobin: 13.8 g/dL (ref 12.0–15.0)
Lymphocytes Relative: 32.5 % (ref 12.0–46.0)
Lymphs Abs: 2.7 10*3/uL (ref 0.7–4.0)
MCHC: 33 g/dL (ref 30.0–36.0)
MCV: 82 fl (ref 78.0–100.0)
Monocytes Absolute: 0.5 10*3/uL (ref 0.1–1.0)
Monocytes Relative: 6 % (ref 3.0–12.0)
Neutro Abs: 4.7 10*3/uL (ref 1.4–7.7)
Neutrophils Relative %: 56.3 % (ref 43.0–77.0)
Platelets: 204 10*3/uL (ref 150.0–400.0)
RBC: 5.09 Mil/uL (ref 3.87–5.11)
RDW: 15.8 % — ABNORMAL HIGH (ref 11.5–15.5)
WBC: 8.3 10*3/uL (ref 4.0–10.5)

## 2020-06-07 LAB — BASIC METABOLIC PANEL
BUN: 10 mg/dL (ref 6–23)
CO2: 23 mEq/L (ref 19–32)
Calcium: 9.4 mg/dL (ref 8.4–10.5)
Chloride: 106 mEq/L (ref 96–112)
Creatinine, Ser: 0.69 mg/dL (ref 0.40–1.20)
GFR: 97.11 mL/min (ref >60.00)
Glucose, Bld: 85 mg/dL (ref 70–99)
Potassium: 4.4 mEq/L (ref 3.5–5.1)
Sodium: 138 mEq/L (ref 135–145)

## 2020-06-07 LAB — TSH: TSH: 2.28 u[IU]/mL (ref 0.35–4.50)

## 2020-06-08 ENCOUNTER — Other Ambulatory Visit: Payer: Self-pay | Admitting: Family Medicine

## 2020-06-08 DIAGNOSIS — F419 Anxiety disorder, unspecified: Secondary | ICD-10-CM | POA: Insufficient documentation

## 2020-06-08 DIAGNOSIS — Z7189 Other specified counseling: Secondary | ICD-10-CM | POA: Insufficient documentation

## 2020-06-08 DIAGNOSIS — D179 Benign lipomatous neoplasm, unspecified: Secondary | ICD-10-CM | POA: Insufficient documentation

## 2020-06-08 DIAGNOSIS — L680 Hirsutism: Secondary | ICD-10-CM | POA: Insufficient documentation

## 2020-06-08 MED ORDER — ESCITALOPRAM OXALATE 10 MG PO TABS: 5.0000 mg | ORAL_TABLET | Freq: Every day | ORAL | 1 refills | Status: DC

## 2020-06-08 NOTE — Assessment & Plan Note (Signed)
Small, not irritated, would defer treatment at this point.  Discussed with patient.

## 2020-06-08 NOTE — Assessment & Plan Note (Signed)
Likely does not make sense to start Chantix now.  She agrees.  She can consider replacement with nicotine patch starting at 21 mg a day.  She will consider in the meantime.

## 2020-06-08 NOTE — Assessment & Plan Note (Signed)
?

## 2020-06-08 NOTE — Assessment & Plan Note (Signed)
Discussed options.  Reasonable to check labs first.  If her labs are unremarkable then a retrial of Lexapro may be completely reasonable.  No suicidal homicidal intent.  Still okay for outpatient follow-up.  Routine medication cautions given to patient.  She agrees to plan.  At least 30 minutes were devoted to patient care in this encounter (this can potentially include time spent reviewing the patient's file/history, interviewing and examining the patient, counseling/reviewing plan with patient, ordering referrals, ordering tests, reviewing relevant laboratory or x-ray data, and documenting the encounter).

## 2020-06-08 NOTE — Assessment & Plan Note (Signed)
On the face, treated with shaving/plucking.  See notes on labs.

## 2020-08-22 ENCOUNTER — Encounter: Payer: Self-pay | Admitting: Family Medicine

## 2020-08-25 ENCOUNTER — Other Ambulatory Visit: Payer: Self-pay | Admitting: Family Medicine

## 2020-08-25 MED ORDER — ESCITALOPRAM OXALATE 10 MG PO TABS: 15.0000 mg | ORAL_TABLET | Freq: Every day | ORAL | 1 refills | Status: DC

## 2020-08-29 ENCOUNTER — Other Ambulatory Visit: Payer: Self-pay | Admitting: Family Medicine

## 2020-10-08 HISTORY — PX: APPENDECTOMY: SHX54

## 2020-10-30 ENCOUNTER — Encounter: Payer: Self-pay | Admitting: Family Medicine

## 2020-10-31 NOTE — Telephone Encounter (Signed)
Pt called triage line to say the rash has moved to her mouth. Has spots on scalp, on eyelid, on left side of face, and center of her lip. She was started on Valtrex and prednisone when she did a telehealth visit with her insurance company on Saturday because they think she has Shingles.  Also, she has rash on her abdomen and thighs that does not look like what is on her face that started a week before her face started.   Asking if she needs to be seen sooner than Friday since she is being treated.

## 2020-10-31 NOTE — Telephone Encounter (Signed)
Please check with patient.  I think it makes sense to get seen in the clinic here, as scheduled.  In the meantime, if she has any facial rash (ie shingles) that is causing any eye symptoms, then we need to get her set up with the eye clinic.  Please let me know if she needs referral to eye clinic.  If her facial rash is worse, then we need to see her sooner rather than later.  Thanks.

## 2020-10-31 NOTE — Telephone Encounter (Signed)
Spoke with patient and she is okay to wait til Friday appt for now. She has not had any eye sx yet; rash is just on eyelid. Advised patient to see eye doctor if develops any eye sx and patient agrees. Also advised we can do referral if she needs one; as well as move appt up if needs to be seen sooner. Will call back if needs to.

## 2020-11-04 ENCOUNTER — Telehealth (INDEPENDENT_AMBULATORY_CARE_PROVIDER_SITE_OTHER): Payer: 59 | Admitting: Family Medicine

## 2020-11-04 ENCOUNTER — Other Ambulatory Visit: Payer: Self-pay

## 2020-11-04 ENCOUNTER — Encounter: Payer: Self-pay | Admitting: Family Medicine

## 2020-11-04 DIAGNOSIS — F32A Depression, unspecified: Secondary | ICD-10-CM

## 2020-11-04 DIAGNOSIS — B029 Zoster without complications: Secondary | ICD-10-CM

## 2020-11-04 MED ORDER — ESCITALOPRAM OXALATE 20 MG PO TABS: 20.0000 mg | ORAL_TABLET | Freq: Every day | ORAL | 1 refills | Status: DC

## 2020-11-04 NOTE — Progress Notes (Signed)
Patient has spots on scalp, on eyelid, on left side of face, and center of her lip. She was started on Valtrex and prednisone when she did a telehealth visit with her insurance company on Saturday and they think she has Shingles. Her rash has started to get better; has 2 days left on the valtrex and prednisone.

## 2020-11-04 NOTE — Progress Notes (Signed)
Virtual visit completed through WebEx or similar program Patient location: home  Provider location: [place] at [facility], office  Participants: Patient and me (unless stated otherwise below)  Pandemic considerations d/w pt.   Limitations and rationale for visit method d/w patient.  Patient agreed to proceed.   CC: rash.    HPI: facial rash.  Did telehealth visit, dx'd with shingles. She had antecedent fatigue.  Then rash erupted.  Started on L side of face, V1 and V2.  Never crossed midline.  She has random tingling and burning on the L side of the face, clearly better in the meantime.  No vision change.  No eye pain.   The two weeks prior she had a trunk rash but that faded in and it was clearly different in appearance.    Pain is manageable now.  D/w pt.    Reasonable to get covid booster when well and off prednisone.    We talked about anxiety and depression vs ADD.  Recent illness didn't improve her situation.  No SI/HI.  She may have had some benefit from lexapro but still has residual sx.    Meds and allergies reviewed.   ROS: Per HPI unless specifically indicated in ROS section   NAD Speech wnl  A/P:  Shingles, improved.  Routine cautions given to patient.  She will update me as needed.  Zoster pathophysiology discussed with patient.  Reasonable to get Covid booster when doing well and when she is off prednisone.  Mood discussed with patient. Would inc lexapro to 20mg  when feeling well then update me as needed.  Okay for outpatient follow-up and she agrees with plan.

## 2020-11-06 DIAGNOSIS — B029 Zoster without complications: Secondary | ICD-10-CM | POA: Insufficient documentation

## 2020-11-06 NOTE — Assessment & Plan Note (Signed)
Mood discussed with patient. Would inc lexapro to 20mg  when feeling well then update me as needed.  Okay for outpatient follow-up and she agrees with plan.

## 2020-11-06 NOTE — Assessment & Plan Note (Signed)
Shingles, improved.  Routine cautions given to patient.  She will update me as needed.  Zoster pathophysiology discussed with patient.  Reasonable to get Covid booster when doing well and when she is off prednisone.

## 2020-11-26 ENCOUNTER — Encounter: Payer: Self-pay | Admitting: Family Medicine

## 2021-02-27 ENCOUNTER — Other Ambulatory Visit: Payer: Self-pay

## 2021-02-27 ENCOUNTER — Encounter: Payer: Self-pay | Admitting: Family Medicine

## 2021-02-27 ENCOUNTER — Ambulatory Visit: Payer: 59 | Admitting: Family Medicine

## 2021-02-27 VITALS — BP 108/78 | HR 77 | Temp 98.2°F | Wt 190.0 lb

## 2021-02-27 DIAGNOSIS — F32A Depression, unspecified: Secondary | ICD-10-CM | POA: Diagnosis not present

## 2021-02-27 MED ORDER — ALBUTEROL SULFATE HFA 108 (90 BASE) MCG/ACT IN AERS: 1.0000 | INHALATION_SPRAY | Freq: Four times a day (QID) | RESPIRATORY_TRACT | 2 refills | Status: DC | PRN

## 2021-02-27 MED ORDER — VENLAFAXINE HCL ER 37.5 MG PO CP24: 37.5000 mg | ORAL_CAPSULE | Freq: Every day | ORAL | 1 refills | Status: DC

## 2021-02-27 NOTE — Progress Notes (Signed)
This visit occurred during the SARS-CoV-2 public health emergency.  Safety protocols were in place, including screening questions prior to the visit, additional usage of staff PPE, and extensive cleaning of exam room while observing appropriate contact time as indicated for disinfecting solutions.  She had shingles appendicitis and covid in the last few months.  She has admittedly had a difficult period of time recently.  She tried going up to 20mg  lexapro in the past, didn't see much change, and stopped med around the time of appendicitis.    Mood d/w pt.  Tearful today.  She is worried about her weight.  When her weight is up, she feels worse about herself.  Positive scoreing on depression and anxiety screening but no SI/HI.  Sx got worse in the last few months.  Safe at home.  Her kids are doing well.  No etoh.  Using MJ, she thought that helped her mood, felt better about her situation after use.  Sleeping more than typical.  Last enjoyable event- kayaking on Mother's Day. She has FH of agoraphobia, leaving the house every few days.  Husband is going to the grocery store for patient.  She feels claustrophobic in the store.  She reports having social anxiety.  She feels safe at home.  Per patient, long standing h/o low mood, "since 3rd grade."  She has panic attacks, especially related to work, especially worse prior to menses.    No h/o manic episodes.  Not hearing voices, etc.   Financial strain noted.  Previously took Zoloft and Lexapro without significant improvement.  Previously took Wellbutrin without significant improvement.  Meds, vitals, and allergies reviewed.   ROS: Per HPI unless specifically indicated in ROS section   GEN: nad, alert and oriented, tearful but regains composure. HEENT: ncat NECK: supple w/o LA CV: rrr.  PULM: ctab, no inc wob ABD: soft, +bs EXT: no edema SKIN: Well-perfused.  30 minutes were devoted to patient care in this encounter (this includes time spent  reviewing the patient's file/history, interviewing and examining the patient, counseling/reviewing plan with patient).

## 2021-02-27 NOTE — Patient Instructions (Addendum)
Go to the lab on the way out.   If you have mychart we'll likely use that to update you.    Start venlafaxine 1 a day for 1 week.  Then increase to 2 a day.   Update me in about 10 days, sooner if needed.  I'll check on counseling options in the meantime.  Take care.  Glad to see you.

## 2021-02-28 LAB — CBC WITH DIFFERENTIAL/PLATELET
Basophils Absolute: 0.1 10*3/uL (ref 0.0–0.1)
Basophils Relative: 1.1 % (ref 0.0–3.0)
Eosinophils Absolute: 0.3 10*3/uL (ref 0.0–0.7)
Eosinophils Relative: 3.5 % (ref 0.0–5.0)
HCT: 38.8 % (ref 36.0–46.0)
Hemoglobin: 12.7 g/dL (ref 12.0–15.0)
Lymphocytes Relative: 28 % (ref 12.0–46.0)
Lymphs Abs: 2.6 10*3/uL (ref 0.7–4.0)
MCHC: 32.7 g/dL (ref 30.0–36.0)
MCV: 78.3 fl (ref 78.0–100.0)
Monocytes Absolute: 0.5 10*3/uL (ref 0.1–1.0)
Monocytes Relative: 5.5 % (ref 3.0–12.0)
Neutro Abs: 5.7 10*3/uL (ref 1.4–7.7)
Neutrophils Relative %: 61.9 % (ref 43.0–77.0)
Platelets: 224 10*3/uL (ref 150.0–400.0)
RBC: 4.96 Mil/uL (ref 3.87–5.11)
RDW: 19 % — ABNORMAL HIGH (ref 11.5–15.5)
WBC: 9.2 10*3/uL (ref 4.0–10.5)

## 2021-03-01 NOTE — Assessment & Plan Note (Addendum)
Discussed options.  No suicidal or homicidal intent.  Would start venlafaxine 37.5 mg a day.  Increase to 75 mg thereafter.  See after visit summary.  I want her to update me in the near future.  She agrees with plan.  I will check on options regarding counseling.  Social work referral placed to see about options with counseling and financial strain. Reasonable to check CBC to make sure she is not anemic in the meantime.  See notes on labs.

## 2021-03-02 ENCOUNTER — Telehealth: Payer: Self-pay | Admitting: *Deleted

## 2021-03-02 NOTE — Chronic Care Management (AMB) (Signed)
  Care Management   Note  03/02/2021 Name: [name] MRN: [id number] DOB: 09-21-1986  Allison [name] is a 35 y.o. year old female who is a primary care patient of Tonia Ghent, MD. I reached out to Sandra Cockayne by phone today in response to a referral sent by Allison [name]'s PCP, Dr. Damita Dunnings.    [name] was given information about care management services today including:  1. Care management services include personalized support from designated clinical staff supervised by her physician, including individualized plan of care and coordination with other care providers 2. 24/7 contact phone numbers for assistance for urgent and routine care needs. 3. The patient may stop care management services at any time by phone call to the office staff.  Patient agreed to services and verbal consent obtained.   Follow up plan: Telephone appointment with care management team member scheduled for:03/15/2021  Koichi Platte  Care Guide, Embedded Care Coordination [place]  Care Management

## 2021-03-13 ENCOUNTER — Encounter: Payer: Self-pay | Admitting: Family Medicine

## 2021-03-15 ENCOUNTER — Telehealth: Payer: Self-pay | Admitting: Family Medicine

## 2021-03-15 ENCOUNTER — Encounter: Payer: Self-pay | Admitting: Family Medicine

## 2021-03-15 ENCOUNTER — Ambulatory Visit: Payer: 59 | Admitting: *Deleted

## 2021-03-15 DIAGNOSIS — F32A Depression, unspecified: Secondary | ICD-10-CM

## 2021-03-15 DIAGNOSIS — F419 Anxiety disorder, unspecified: Secondary | ICD-10-CM

## 2021-03-15 NOTE — Telephone Encounter (Signed)
Called the patient and she said that 30 minutes after taking medication she feels like she is in flight or fight mode. Feels like she is on the the verge of an panic attack. She also states that her heart begins to pound and has palpitations for a good hour after wards. Feels like she is ADD and can't sit still  Please advise.

## 2021-03-15 NOTE — Telephone Encounter (Signed)
Patient states that she was at the verge of feeling better like how her body regulated one pill but feels that when she was taking her second pill has made her feel the way she is now. Patient stated it did not work with anxiety or depression.

## 2021-03-15 NOTE — Telephone Encounter (Signed)
See my chart message

## 2021-03-15 NOTE — Addendum Note (Signed)
Addended by: Tonia Ghent on: 03/15/2021 08:39 PM   Modules accepted: Orders

## 2021-03-15 NOTE — Telephone Encounter (Signed)
Did she have that trouble with the initial dose or only since she increased the dose?  Did the initial low dose help at all?  Please let me know.

## 2021-03-15 NOTE — Telephone Encounter (Signed)
Please check in with patient about status/symptoms on venlafaxine.  See if the initial dose did any good, current status, etc.  Thanks.

## 2021-03-16 ENCOUNTER — Other Ambulatory Visit: Payer: Self-pay | Admitting: Family Medicine

## 2021-03-16 MED ORDER — BUSPIRONE HCL 5 MG PO TABS: 5.0000 mg | ORAL_TABLET | Freq: Two times a day (BID) | ORAL | 1 refills | Status: DC

## 2021-03-16 NOTE — Patient Instructions (Signed)
Visit Information   PATIENT GOALS:   Goals Addressed             This Visit's Progress    Begin and Stick with Counseling-Depression       Timeframe:  Long-Range Goal Priority:  High Start Date:    03/15/2021                         Expected End Date:     06/07/2021                  Follow Up Date 03/27/2021    -anticipate outreach call/text from [place] to schedule counseling appointment (virtual) -CALL 911 if you feel suicidal or have any emergent needs -reach out to family for positive emotional support when feeling down    Why is this important?   Beating depression may take some time.  If you don't feel better right away, don't give up on your treatment plan.    Notes:          Consent to CCM Services: Allison [name] was given information about Chronic Care Management services today including:  CCM service includes personalized support from designated clinical staff supervised by her physician, including individualized plan of care and coordination with other care providers 24/7 contact phone numbers for assistance for urgent and routine care needs. Service will only be billed when office clinical staff spend 20 minutes or more in a month to coordinate care. Only one practitioner may furnish and bill the service in a calendar month. The patient may stop CCM services at any time (effective at the end of the month) by phone call to the office staff. The patient will be responsible for cost sharing (co-pay) of up to 20% of the service fee (after annual deductible is met).  Patient agreed to services and verbal consent obtained.   Patient verbalizes understanding of instructions provided today and agrees to view in [place].   Telephone follow up appointment with care management team member scheduled for:03/27/21  Eduard Clos MSW, LCSW Licensed Clinical Social Worker [place] [phone]   CLINICAL CARE PLAN: Patient Care Plan: LCSW Plan of Care      Problem Identified: Inability to self manage mental health needs   Priority: High     Long-Range Goal: Develop and self manage care plan to reduce symptoms related to depression/anxiety and other mental health concerns   Start Date: 03/15/2021  Expected End Date: 06/07/2021  This Visit's Progress: On track  Priority: High  Note:   Current barriers:   Severe Persistent Mental Health needs related to depression, anxiety, social anxiety and other Mental Health Concerns , Social Isolation, Lacks knowledge of community resource: counseling, and RX concerns/side effects Needs Support, Education, and Care Coordination in order to meet unmet mental health needs. Clinical Goal(s): Over the next60 days, patient will work with SW to reduce or manage symptoms of anxiety, depression, and stress and increase knowledge and/or ability of: coping skills, self-management skills, stress reduction, and medication management .until connected for ongoing counseling.  Clinical Interventions:  CSW had initial outreach call with pt to assess and provide interventions, support and services. Pt admits to a long-standing history of depression/anxiety; going back to elementary school when she "tried to strangle self with a children's vacuum hose".  Pt shared growing up in a home where there was  "volatile" adults with a step dad who was bipolar. Pt also shares a strong family history of  depression and 5 suicides on her grandfather's side as well as one with agorpahobia. Pt admits to having social anxiety when leaving her home- she becomes more anxious and uncomfortable. Pt also admits to attempting to overdose in the 11th grade- requiring a hospital stay at [facility]. She does not remember any post hospital counseling or Psychiatry follow up- does recall her family seeing Dr Pablo Ledger when she was growing up for family dynamics.  Pt reports having good support from her mother, husband and her 32yo and 74yo, siblings and friends.   Pt recently was started on Effexor- with a dosing of 1 pill QD for one week and then to increase to 2 pills daily. She feels the medicine is causing her to have side effects that are negative; "brain jitters, feeling ADD, increased heart rate" etc. CSW offered to make PCP aware so he could communicate with pt on what to do going forward- Dr Damita Dunnings is aware and planned to follow up with pt.  Pt is agreeable to seeking counseling and would prefer to do virtual with her social anxiety- CSW will place referral.  Assessed patient's previous treatment, needs, coping skills, current treatment, support system and barriers to care  Other interventions: Depression screen reviewed , Mindfulness or Relaxation Training, Psychoeducation for mental health needs , Motivational Interviewing, Reviewed mental health medications with patient and discussed compliance: will outreach PCP for input on steps going forward with Effexor, Quality of sleep assessed & Sleep Hygiene techniques promoted , Caregiver stress acknowledged , Participation in counseling encouraged , Verbalization of feelings encouraged , Crisis Resource Education / information provided for 24 hour Crisis line, Suicidal Ideation/Homicidal Ideation assessed:, and PHQ2/ PHQ9 completed .dep Patient interviewed and appropriate assessments performed Provided patient with information about mental health support, Crisis Line Collaborated with primary care provider re: RX concerns pt has with side effects from recently begun Effexor Referred patient to [place] (mental health provider) for long term follow up and therapy/counseling Discussed several options for long term counseling based on need and insurance.  Collaboration with PCP regarding development and update of comprehensive plan of care as evidenced by provider attestation and co-signature Inter-disciplinary care team collaboration (see longitudinal plan of care) Patient Goals/Self-Care Activities: - avoid  negative self-talk - develop a personal safety plan - develop a plan to deal with triggers like holidays, anniversaries - have a plan for how to handle bad days - journal feelings and what helps to feel better or worse - spend time or talk with others every day - watch for early signs of feeling worse - write in journal every day - begin personal counseling - learn and use visualization or guided imagery - practice relaxation or meditation daily - start or continue a personal journal - talk about feelings with a friend, family or spiritual advisor - practice positive thinking and self-talk Continue with compliance of taking medication  I have placed a referral with [facility] to assist with connecting you with a mental health provider. they will contact you once a provider is located.

## 2021-03-16 NOTE — Chronic Care Management (AMB) (Signed)
Chronic Care Management    Clinical Social Work Note  03/16/2021 Name: Allison [name] MRN: [id number] DOB: 08/15/1987Mental Health Counseling and Resources  Allison [name] is a 35 y.o. year old female who is a primary care patient of Tonia Ghent, MD. The CCM team was consulted to assist the patient with chronic disease management and/or care coordination needs related to: Mental Health Counseling and Resources.   Engaged with patient by telephone for initial visit in response to provider referral for social work chronic care management and care coordination services.   Consent to Services:  The patient was given information about Chronic Care Management services, agreed to services, and gave verbal consent prior to initiation of services.  Please see initial visit note for detailed documentation.   Patient agreed to services and consent obtained.   Assessment: Review of patient past medical history, allergies, medications, and health status, including review of relevant consultants reports was performed today as part of a comprehensive evaluation and provision of chronic care management and care coordination services.     SDOH (Social Determinants of Health) assessments and interventions performed:  SDOH Interventions    Flowsheet Row Most Recent Value  SDOH Interventions   Depression Interventions/Treatment  Medication, Counseling        Advanced Directives Status: Not addressed in this encounter.  CCM Care Plan  Allergies  Allergen Reactions   Lexapro [Escitalopram]     Lack of effect   Venlafaxine     jittery   Wellbutrin [Bupropion]     Lack of effect   Zoloft [Sertraline]     Lack of effect   Adhesive [Tape] Rash    Outpatient Encounter Medications as of 03/15/2021  Medication Sig Note   [DISCONTINUED] venlafaxine XR (EFFEXOR XR) 37.5 MG 24 hr capsule Take 1-2 capsules (37.5-75 mg total) by mouth daily with breakfast. Start with 1 a day.  Increase to 2 a day  after 1 week. 03/15/2021: Has increased to 2 tablets    albuterol (VENTOLIN HFA) 108 (90 Base) MCG/ACT inhaler Inhale 1-2 puffs into the lungs every 6 (six) hours as needed for wheezing or shortness of breath.    [DISCONTINUED] enoxaparin (LOVENOX) 300 MG/3ML SOLN injection Inject 0.4 mLs (40 mg total) into the skin daily.    [DISCONTINUED] pantoprazole (PROTONIX) 40 MG tablet Take 1 tablet (40 mg total) by mouth daily.    No facility-administered encounter medications on file as of 03/15/2021.    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   Depression 07/09/2011   Smoker 07/09/2011   Migraine 07/09/2011   Obesity 07/09/2011   HLD (hyperlipidemia) 07/09/2011    Conditions to be addressed/monitored: Anxiety and Depression; Mental Health Concerns   Care Plan : LCSW Plan of Care  Updates made by Deirdre Peer, LCSW since 03/16/2021 12:00 AM     Problem: Inability to self manage mental health needs   Priority: High     Long-Range Goal: Develop and self manage care plan to reduce symptoms related to depression/anxiety and other mental health concerns   Start Date: 03/15/2021  Expected End Date: 06/07/2021  This Visit's Progress: On track  Priority:  High  Note:   Current barriers:   Severe Persistent Mental Health needs related to depression, anxiety, social anxiety and other Mental Health Concerns , Social Isolation, Lacks knowledge of community resource: counseling, and RX concerns/side effects Needs Support, Education, and Care Coordination in order to meet unmet mental health needs. Clinical Goal(s): Over the next60  days, patient will work with SW to reduce or manage symptoms of anxiety, depression, and stress and increase knowledge and/or ability of: coping skills, self-management skills, stress reduction, and medication management .until connected for ongoing counseling.  Clinical Interventions:  CSW had initial outreach call with pt to assess and provide interventions, support and services. Pt admits to a long-standing history of depression/anxiety; going back to elementary school when she "tried to strangle self with a children's vacuum hose".  Pt shared growing up in a home where there was  "volatile" adults with a step dad who was bipolar. Pt also shares a strong family history of depression and 5 suicides on her grandfather's side as well as one with agorpahobia. Pt admits to having social anxiety when leaving her home- she becomes more anxious and uncomfortable. Pt also admits to attempting to overdose in the 11th grade- requiring a hospital stay at [facility]. She does not remember any post hospital counseling or Psychiatry follow up- does recall her family seeing Dr Pablo Ledger when she was growing up for family dynamics.  Pt reports having good support from her mother, husband and her 47yo and 77yo, siblings and friends.  Pt recently was started on Effexor- with a dosing of 1 pill QD for one week and then to increase to 2 pills daily. She feels the medicine is causing her to have side effects that are negative; "brain jitters, feeling ADD, increased heart rate" etc. CSW offered to make PCP aware so he could communicate with pt on what to do going forward- Dr Damita Dunnings is aware and planned to follow up with pt.  Pt is agreeable to seeking counseling and would prefer to do virtual with her social anxiety- CSW will place referral.  Assessed patient's previous treatment, needs, coping skills, current treatment, support system and barriers to care  Other interventions: Depression screen reviewed , Mindfulness or Relaxation  Training, Psychoeducation for mental health needs , Motivational Interviewing, Reviewed mental health medications with patient and discussed compliance: will outreach PCP for input on steps going forward with Effexor, Quality of sleep assessed & Sleep Hygiene techniques promoted , Caregiver stress acknowledged , Participation in counseling encouraged , Verbalization of feelings encouraged , Crisis Resource Education / information provided for 24 hour Crisis line, Suicidal Ideation/Homicidal Ideation assessed:, and PHQ2/ PHQ9 completed .dep Patient interviewed and appropriate assessments performed Provided patient with information about mental health support, Crisis Line Collaborated with primary care provider re: RX concerns pt has with side effects from recently begun Effexor Referred patient to [place] (mental health provider) for long term follow up and therapy/counseling Discussed several options for long term counseling based on need and insurance.  Collaboration with PCP regarding development and update of comprehensive plan of care as evidenced by provider attestation and co-signature Inter-disciplinary care team collaboration (see longitudinal plan of care) Patient Goals/Self-Care Activities: - avoid negative self-talk - develop a personal safety plan - develop a plan to deal with triggers like holidays, anniversaries - have a plan for how to handle bad days - journal feelings and what helps to feel better or worse - spend time or talk with others every day - watch  for early signs of feeling worse - write in journal every day - begin personal counseling - learn and use visualization or guided imagery - practice relaxation or meditation daily - start or continue a personal journal - talk about feelings with a friend, family or spiritual advisor - practice positive thinking and self-talk Continue with compliance of taking medication  I have placed a referral with [facility] to assist with  connecting you with a mental health provider. they will contact you once a provider is located.        Follow Up Plan: SW will follow up with patient by phone over the next 2 weeks, Client will receive call from [place] with counseling appointment, and Embedded care coordination team will continue to follow patient progress and assist with care coordination needs post discharge. Pt will receive follow up from PCP re: medication plan-      Eduard Clos MSW, [place] Licensed Clinical Social Worker [place] [phone]

## 2021-03-27 ENCOUNTER — Telehealth: Payer: Self-pay | Admitting: *Deleted

## 2021-03-27 ENCOUNTER — Telehealth: Payer: 59

## 2021-03-27 NOTE — Telephone Encounter (Signed)
  Care Management   Follow Up Note   03/27/2021 Name: Allison [name] MRN: [id number] DOB: 1986/03/17   Referred by: Tonia Ghent, MD Reason for referral : No chief complaint on file.   An unsuccessful telephone outreach was attempted today. The patient was referred to the case management team for assistance with care management and care coordination.   Follow Up Plan: The patient has been provided with contact information for the care management team and has been advised to call with any health related questions or concerns.  CSW will outreach pt again in 7-10 days if no return call received.  Eduard Clos MSW, LCSW Licensed Clinical Social Worker [place] [phone]

## 2021-04-18 ENCOUNTER — Ambulatory Visit: Payer: 59 | Admitting: *Deleted

## 2021-04-18 DIAGNOSIS — F419 Anxiety disorder, unspecified: Secondary | ICD-10-CM

## 2021-04-18 DIAGNOSIS — F32A Depression, unspecified: Secondary | ICD-10-CM

## 2021-04-18 NOTE — Progress Notes (Signed)
Attempted to reach patient by phone to discuss buspirone. Left VM with my contact information.  Debbora Dus, PharmD Clinical Pharmacist [facility] [phone]

## 2021-04-18 NOTE — Chronic Care Management (AMB) (Signed)
Chronic Care Management    Clinical Social Work Note  04/18/2021 Name: Allison [name] MRN: [id number] DOB: [birth year]  Allison [name] is a 35 y.o. year old female who is a primary care patient of Tonia Ghent, MD. The CCM team was consulted to assist the patient with chronic disease management and/or care coordination needs related to: Mental Health Counseling and Resources.   Engaged with patient by telephone for follow up visit in response to provider referral for social work chronic care management and care coordination services.   Consent to Services:  The patient was given information about Chronic Care Management services, agreed to services, and gave verbal consent prior to initiation of services.  Please see initial visit note for detailed documentation.   Patient agreed to services and consent obtained.   Assessment: Review of patient past medical history, allergies, medications, and health status, including review of relevant consultants reports was performed today as part of a comprehensive evaluation and provision of chronic care management and care coordination services.     SDOH (Social Determinants of Health) assessments and interventions performed:    Advanced Directives Status: Not addressed in this encounter.  CCM Care Plan  Allergies  Allergen Reactions   Lexapro [Escitalopram]     Lack of effect   Venlafaxine     jittery   Wellbutrin [Bupropion]     Lack of effect   Zoloft [Sertraline]     Lack of effect   Adhesive [Tape] Rash    Outpatient Encounter Medications as of 04/18/2021  Medication Sig   albuterol (VENTOLIN HFA) 108 (90 Base) MCG/ACT inhaler Inhale 1-2 puffs into the lungs every 6 (six) hours as needed for wheezing or shortness of breath.   busPIRone (BUSPAR) 5 MG tablet Take 1-2 tablets (5-10 mg total) by mouth 2 (two) times daily.   [DISCONTINUED] enoxaparin (LOVENOX) 300 MG/3ML SOLN injection Inject 0.4 mLs (40 mg total) into the skin  daily.   [DISCONTINUED] pantoprazole (PROTONIX) 40 MG tablet Take 1 tablet (40 mg total) by mouth daily.   No facility-administered encounter medications on file as of 04/18/2021.    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   Depression 07/09/2011   Smoker 07/09/2011   Migraine 07/09/2011   Obesity 07/09/2011   HLD (hyperlipidemia) 07/09/2011    Conditions to be addressed/monitored: Anxiety and Depression; Mental Health Concerns   Care Plan : LCSW Plan of Care  Updates made by Deirdre Peer, LCSW since 04/18/2021 12:00 AM     Problem: Inability to self manage mental health needs   Priority: High     Long-Range Goal: Develop and self manage care plan to reduce symptoms related to depression/anxiety and other mental health concerns   Start Date: 03/15/2021  Expected End Date: 06/07/2021  This Visit's Progress: On track  Recent Progress: On track  Priority: High  Note:   Current barriers:   Severe Persistent Mental Health needs related to depression, anxiety, social anxiety and other Mental Health Concerns , Social Isolation, Lacks knowledge of community resource: counseling, and RX concerns/side effects Needs Support, Education, and Care Coordination in order to meet unmet mental health needs.  Clinical Goal(s): Over the next60 days, patient will work with SW to reduce or manage symptoms of anxiety, depression, and stress and increase knowledge and/or ability of: coping skills, self-management skills, stress reduction, and medication management .until connected for ongoing counseling.  Clinical Interventions:  Pt  reports today that she has had 2 virtual counseling sessions; would like to try another Provider. CSW validated and commended her for acknowledging she was not completely happy/or connecting with the one she has currently.  CSW will assist with referral to another counseling service/provider.   Pt also shared that she has not started taking the Buspar.  She is a bit skeptical about a new drug and also states she is confused on the dosing; "It says take one or two a day".  CSW will alert PCP office and Pharmacist at office of this for outreach and clarification.   CSW had initial outreach call with pt to assess and provide interventions, support and services. Pt admits to a long-standing history of depression/anxiety; going back to elementary school when she "tried to strangle self with a children's vacuum hose".  Pt shared growing up in a home where there was  "volatile" adults with a step dad who was bipolar. Pt also shares a strong family history of depression and 5 suicides on her grandfather's side as well as one with agorpahobia. Pt admits to having social anxiety when leaving her home- she becomes more anxious and uncomfortable. Pt also admits to attempting to overdose in the 11th grade- requiring a hospital stay at [facility]. She does not remember any post hospital counseling or Psychiatry follow up- does recall her family seeing Dr Pablo Ledger when she was growing up for family dynamics.  Pt reports having good support from her mother, husband and her 70yo and 63yo, siblings and friends.  Pt recently was started on Effexor- with a dosing of 1 pill QD for one week and then to increase to 2 pills daily. She feels the medicine is causing her to have side effects that are negative; "brain jitters, feeling ADD, increased heart rate" etc. CSW offered to make PCP aware so he could communicate with pt on what to do going forward- Dr Damita Dunnings is aware and planned to follow up with pt.  Pt is agreeable to seeking  counseling and would prefer to do virtual with her social anxiety- CSW will place referral.  Assessed patient's previous treatment, needs, coping skills, current treatment, support system and barriers to care  Other interventions: Depression screen reviewed , Mindfulness or Relaxation Training, Psychoeducation for mental health needs , Motivational Interviewing, Reviewed mental health medications with patient and discussed compliance: will outreach PCP for input on steps going forward with Effexor, Quality of sleep assessed & Sleep Hygiene techniques promoted , Caregiver stress acknowledged , Participation in counseling encouraged , Verbalization of feelings encouraged , Crisis Resource Education / information provided for 24 hour Crisis line, Suicidal Ideation/Homicidal Ideation assessed:, and PHQ2/ PHQ9 completed .dep Patient interviewed and appropriate assessments performed Provided patient with information about mental health support, Crisis Line Collaborated with primary care provider re: RX concerns pt has with side effects from recently begun Effexor Referred patient to [place] (mental health provider) for long term follow up and therapy/counseling Discussed several options for long term counseling based on need and insurance.  Collaboration with PCP regarding development and update of comprehensive plan of care as evidenced by provider attestation and co-signature Inter-disciplinary care team collaboration (see longitudinal plan of care) Patient Goals/Self-Care Activities:  -  anticipate outreach call/text from agency to schedule counseling appointment (virtual) -communicate with PCP office about questions related to RX dosing -CALL 911 if you feel suicidal or have any emergent needs -reach out to family for positive emotional support when feeling down - avoid negative self-talk - develop a personal safety plan - develop a plan to deal with triggers like holidays, anniversaries - have a plan  for how to handle bad days - journal feelings and what helps to feel better or worse - spend time or talk with others every day - watch for early signs of feeling worse - write in journal every day - begin personal counseling - learn and use visualization or guided imagery - practice relaxation or meditation daily - start or continue a personal journal - talk about feelings with a friend, family or spiritual advisor - practice positive thinking and self-talk      Follow Up Plan: Appointment scheduled for SW follow up with client by phone on: 05/09/21      Eduard Clos MSW, [place] Licensed Clinical Social Worker [place] [phone]

## 2021-04-18 NOTE — Patient Instructions (Signed)
Visit Information  PATIENT GOALS:  Goals Addressed             This Visit's Progress    Begin and Stick with Counseling-Depression       Timeframe:  Long-Range Goal Priority:  High Start Date:    03/15/2021                         Expected End Date:     06/07/2021                  Follow Up Date 05/09/2021    -anticipate outreach call/text from agency to schedule counseling appointment (virtual) -communicate with PCP office about questions related to RX dosing -CALL 911 if you feel suicidal or have any emergent needs -reach out to family for positive emotional support when feeling down    Why is this important?   Beating depression may take some time.  If you don't feel better right away, don't give up on your treatment plan.    Notes:          Patient verbalizes understanding of instructions provided today and agrees to view in [place].   Telephone follow up appointment with care management team member scheduled for:05/09/21  Eduard Clos MSW, LCSW Licensed Clinical Social Worker [place] [phone]

## 2021-04-19 ENCOUNTER — Telehealth: Payer: Self-pay | Admitting: Family Medicine

## 2021-04-19 DIAGNOSIS — F419 Anxiety disorder, unspecified: Secondary | ICD-10-CM

## 2021-04-19 NOTE — Telephone Encounter (Signed)
Please check with patient.   After stopping venlafaxine, I thought she was going to start with 1 of the 5mg  buspar tabs twice a day.  After 5 days, increase to 2 of the 5mg  tabs twice a day.   I saw the social work note about counseling and I still think it makes sense to try buspar.  Please let me know how it goes.  Thanks.

## 2021-04-19 NOTE — Telephone Encounter (Signed)
Left VM requesting pt to call the office back 

## 2021-04-25 NOTE — Telephone Encounter (Signed)
Spoke with patient and she is taking the Buspar as discussed after stopping venlafaxine. Patient has now been on the busbar 2 tabs twice a day for about 3-4 days. She has not really been able to tell any difference yet.

## 2021-04-26 NOTE — Telephone Encounter (Signed)
I appreciate her effort.  I would give it a few more days.  Please have her update [place] next week.  Thanks.

## 2021-04-27 NOTE — Telephone Encounter (Signed)
LMTCB

## 2021-05-01 NOTE — Telephone Encounter (Signed)
Patient states the only difference she noticed was she experienced chronic headaches constant everyday. States there was nothing that would touch them or clear them up. She stopped the buspar completely Saturday and her headaches stopped completely.

## 2021-05-02 NOTE — Telephone Encounter (Signed)
I think it makes sense to stay off buspar and f/u wth psych.  Let me know if she needs a referral.  Thanks.

## 2021-05-02 NOTE — Addendum Note (Signed)
Addended by: Tonia Ghent on: 05/02/2021 08:01 AM   Modules accepted: Orders

## 2021-05-02 NOTE — Telephone Encounter (Signed)
Spoke with patient and advised to stay off buspar and f/u with psych. Patient does need a referral to psych. She does not care where she goes.

## 2021-05-03 NOTE — Addendum Note (Signed)
Addended by: Tonia Ghent on: 05/03/2021 03:44 PM   Modules accepted: Orders

## 2021-05-03 NOTE — Telephone Encounter (Signed)
Referral placed  Thanks!

## 2021-05-09 ENCOUNTER — Ambulatory Visit: Payer: 59 | Admitting: *Deleted

## 2021-05-09 DIAGNOSIS — F419 Anxiety disorder, unspecified: Secondary | ICD-10-CM

## 2021-05-09 DIAGNOSIS — F32A Depression, unspecified: Secondary | ICD-10-CM

## 2021-05-09 NOTE — Patient Instructions (Signed)
Visit Information  PATIENT GOALS:  Goals Addressed             This Visit's Progress    Begin and Stick with Counseling-Depression       Timeframe:  Long-Range Goal Priority:  High Start Date:    03/15/2021                         Expected End Date:     07/07/2021                  Follow Up Date 06/05/2021    -anticipate outreach call/text from agency to schedule counseling appointment (virtual) and Psychiatry -communicate with PCP office about questions related to RX dosing -CALL 911 if you feel suicidal or have any emergent needs -reach out to family for positive emotional support when feeling down    Why is this important?   Beating depression may take some time.  If you don't feel better right away, don't give up on your treatment plan.    Notes:         The patient verbalized understanding of instructions, educational materials, and care plan provided today and declined offer to receive copy of patient instructions, educational materials, and care plan.   The patient has been provided with contact information for the care management team and has been advised to call with any health related questions or concerns.   Eduard Clos MSW, LCSW Licensed Clinical Social Worker [place] [phone]

## 2021-05-09 NOTE — Chronic Care Management (AMB) (Signed)
Chronic Care Management    Clinical Social Work Note  05/09/2021 Name: Allison [name] MRN: [id number] DOB: 02-Dec-1985  Allison [name] is a 35 y.o. year old female who is a primary care patient of Tonia Ghent, MD. The CCM team was consulted to assist the patient with chronic disease management and/or care coordination needs related to: Mental Health Counseling and Resources.   Engaged with patient by telephone for follow up visit in response to provider referral for social work chronic care management and care coordination services.   Consent to Services:  The patient was given information about Chronic Care Management services, agreed to services, and gave verbal consent prior to initiation of services.  Please see initial visit note for detailed documentation.   Patient agreed to services and consent obtained.   Assessment: Review of patient past medical history, allergies, medications, and health status, including review of relevant consultants reports was performed today as part of a comprehensive evaluation and provision of chronic care management and care coordination services.     SDOH (Social Determinants of Health) assessments and interventions performed:    Advanced Directives Status: Not addressed in this encounter.  CCM Care Plan  Allergies  Allergen Reactions   Buspar [Buspirone]     headaches   Lexapro [Escitalopram]     Lack of effect   Venlafaxine     jittery   Wellbutrin [Bupropion]     Lack of effect   Zoloft [Sertraline]     Lack of effect   Adhesive [Tape] Rash    Outpatient Encounter Medications as of 05/09/2021  Medication Sig   albuterol (VENTOLIN HFA) 108 (90 Base) MCG/ACT inhaler Inhale 1-2 puffs into the lungs every 6 (six) hours as needed for wheezing or shortness of breath.   [DISCONTINUED] enoxaparin (LOVENOX) 300 MG/3ML SOLN injection Inject 0.4 mLs (40 mg total) into the skin daily.   [DISCONTINUED] pantoprazole (PROTONIX) 40 MG tablet  Take 1 tablet (40 mg total) by mouth daily.   No facility-administered encounter medications on file as of 05/09/2021.    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   Depression 07/09/2011   Smoker 07/09/2011   Migraine 07/09/2011   Obesity 07/09/2011   HLD (hyperlipidemia) 07/09/2011    Conditions to be addressed/monitored: Depression; Mental Health Concerns   Care Plan : LCSW Plan of Care  Updates made by Deirdre Peer, LCSW since 05/09/2021 12:00 AM     Problem: Inability to self manage mental health needs   Priority: High     Long-Range Goal: Develop and self manage care plan to reduce symptoms related to depression/anxiety and other mental health concerns   Start Date: 03/15/2021  Expected End Date: 07/07/2021  This Visit's Progress: On track  Recent Progress: On track  Priority: High  Note:   Current barriers:   Severe Persistent Mental Health needs related to depression, anxiety, social anxiety and other Mental Health Concerns , Social Isolation, Lacks knowledge of community resource: counseling, and RX concerns/side effects Needs Support, Education, and Care Coordination in order to meet unmet mental health needs. Clinical Goal(s): Over the next60 days, patient will work with SW to  reduce or manage symptoms of anxiety, depression, and stress and increase knowledge and/or ability of: coping skills, self-management skills, stress reduction, and medication management .until connected for ongoing counseling.  Clinical Interventions:  Today, pt reports feeling "more like myself". She has stopped taking the Buspar  due to migraines and is awaiting Psychiatry appointment for medication advisement/monitoring.  Pt has had several virtual counseling sessions; would like to try another Provider. CSW validated and commended her for acknowledging she was not completely happy/or connecting with the one she has currently.  CSW will assist with referral to another counseling service/provider.   CSW had initial outreach call with pt to assess and provide interventions, support and services. Pt admits to a long-standing history of depression/anxiety; going back to elementary school when she "tried to strangle self with a children's vacuum hose".  Pt shared growing up in a home where there was  "volatile" adults with a step dad who was bipolar. Pt also shares a strong family history of depression and 5 suicides on her grandfather's side as well as one with agorpahobia. Pt admits to having social anxiety when leaving her home- she becomes more anxious and uncomfortable. Pt also admits to attempting to overdose in the 11th grade- requiring a hospital stay at [facility]. She does not remember any post hospital counseling or Psychiatry follow up- does recall her family seeing Dr Pablo Ledger when she was growing up for family dynamics.  Pt reports having good support from her mother, husband and her 41yo and 52yo, siblings and friends.  Pt recently was started on Effexor- with a dosing of 1 pill QD for one week and then to increase to 2 pills daily. She feels the medicine is causing her to have side effects that are negative; "brain jitters, feeling ADD, increased heart rate" etc. CSW offered to make PCP aware so he could communicate with pt on what to do going forward- Dr Damita Dunnings is aware and planned to follow up with pt.  Pt is agreeable to seeking counseling and would prefer to do virtual with her social anxiety- CSW will place referral.  Assessed patient's previous treatment, needs, coping skills, current treatment, support system and barriers  to care  Other interventions: Depression screen reviewed , Mindfulness or Relaxation Training, Psychoeducation for mental health needs , Motivational Interviewing, Reviewed mental health medications with patient and discussed compliance: will outreach PCP for input on steps going forward with Effexor, Quality of sleep assessed & Sleep Hygiene techniques promoted , Caregiver stress acknowledged , Participation in counseling encouraged , Verbalization of feelings encouraged , Crisis Resource Education / information provided for 24 hour Crisis line, Suicidal Ideation/Homicidal Ideation assessed:, and PHQ2/ PHQ9 completed .dep Patient interviewed and appropriate assessments performed Provided patient with information about mental health support, Crisis Line Collaborated with primary care provider re: RX concerns pt has with side effects from recently begun Effexor Referred patient to [place] (mental health provider) for long term follow up and therapy/counseling Discussed several options for long term counseling based on need and insurance.  Collaboration with PCP regarding development and update of comprehensive plan of care as evidenced by provider attestation and co-signature Inter-disciplinary care team collaboration (see longitudinal plan of care) Patient Goals/Self-Care Activities:    -anticipate outreach call/text from agency to schedule counseling appointment (virtual) and Psychiatry -communicate with PCP office about questions related to RX dosing -CALL 911 if you feel suicidal or have any emergent needs -reach out to family for positive emotional support when feeling down -anticipate  outreach call/text from agency to schedule counseling appointment (virtual) -communicate with PCP office about questions related to RX dosing - avoid negative self-talk - develop a personal safety plan - develop a plan to deal with triggers like holidays, anniversaries - have a plan for how to handle bad  days - journal feelings and what helps to feel better or worse - spend time or talk with others every day - watch for early signs of feeling worse - write in journal every day       Follow Up Plan: Appointment scheduled for SW follow up with client by phone on: 06/05/21      Eduard Clos MSW, [place] Licensed Clinical Social Worker [place] [phone]

## 2021-06-05 ENCOUNTER — Ambulatory Visit: Payer: 59 | Admitting: *Deleted

## 2021-06-05 NOTE — Chronic Care Management (AMB) (Signed)
Chronic Care Management    Clinical Social Work Note  06/05/2021 Name: Allison [name] MRN: [id number] DOB: 1986-09-18  Allison [name] is a 35 y.o. year old female who is a primary care patient of Tonia Ghent, MD. The CCM team was consulted to assist the patient with chronic disease management and/or care coordination needs related to: Mental Health Counseling and Resources.   Engaged with patient by telephone for follow up visit in response to provider referral for social work chronic care management and care coordination services.   Consent to Services:  The patient was given information about Chronic Care Management services, agreed to services, and gave verbal consent prior to initiation of services.  Please see initial visit note for detailed documentation.   Patient agreed to services and consent obtained.   Assessment: Review of patient past medical history, allergies, medications, and health status, including review of relevant consultants reports was performed today as part of a comprehensive evaluation and provision of chronic care management and care coordination services.     SDOH (Social Determinants of Health) assessments and interventions performed:    Advanced Directives Status: Not addressed in this encounter.  CCM Care Plan  Allergies  Allergen Reactions   Buspar [Buspirone]     headaches   Lexapro [Escitalopram]     Lack of effect   Venlafaxine     jittery   Wellbutrin [Bupropion]     Lack of effect   Zoloft [Sertraline]     Lack of effect   Adhesive [Tape] Rash    Outpatient Encounter Medications as of 06/05/2021  Medication Sig   albuterol (VENTOLIN HFA) 108 (90 Base) MCG/ACT inhaler Inhale 1-2 puffs into the lungs every 6 (six) hours as needed for wheezing or shortness of breath.   [DISCONTINUED] enoxaparin (LOVENOX) 300 MG/3ML SOLN injection Inject 0.4 mLs (40 mg total) into the skin daily.   [DISCONTINUED] pantoprazole (PROTONIX) 40 MG tablet  Take 1 tablet (40 mg total) by mouth daily.   No facility-administered encounter medications on file as of 06/05/2021.    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   Depression 07/09/2011   Smoker 07/09/2011   Migraine 07/09/2011   Obesity 07/09/2011   HLD (hyperlipidemia) 07/09/2011    Conditions to be addressed/monitored: Anxiety and Depression; Mental Health Concerns   Care Plan : LCSW Plan of Care  Updates made by Deirdre Peer, LCSW since 06/05/2021 12:00 AM     Problem: Inability to self manage mental health needs   Priority: High     Long-Range Goal: Develop and self manage care plan to reduce symptoms related to depression/anxiety and other mental health concerns   Start Date: 03/15/2021  Expected End Date: 07/21/2021  This Visit's Progress: On track  Recent Progress: On track  Priority: High  Note:   Current barriers:   Severe Persistent Mental Health needs related to depression, anxiety, social anxiety and other Mental Health Concerns , Social Isolation, Lacks knowledge of community resource: counseling, and RX concerns/side effects Needs Support, Education, and Care Coordination in order to meet unmet mental health needs. Clinical Goal(s): Over the next60 days, patient will work with  SW to reduce or manage symptoms of anxiety, depression, and stress and increase knowledge and/or ability of: coping skills, self-management skills, stress reduction, and medication management .until connected for ongoing counseling.  Clinical Interventions:  Today, pt reports she continues to feel better and with less  depression/anxiety. She credits the fact she has had other concerns about her child being sick to have cause her to have increased focus on the child and not on herself.  Pt continues to not take any mental health meds; as previously shared  has stopped taking the Buspar due to migraines.  Pt requesting contact info to reach out to the mental health counselor and Psychiatry referrals that were made- CSW gave pt contact #'s and plans to call them to get scheduled.   CSW offered pt support and encouragement and will plan to reassess her depression during next scheduled appointment.s   CAssessed patient's previous treatment, needs, coping skills, current treatment, support system and barriers to care  Other interventions: Depression screen reviewed , Mindfulness or Relaxation Training, Psychoeducation for mental health needs , Motivational Interviewing, Reviewed mental health medications with patient and discussed compliance: will outreach PCP for input on steps going forward with Effexor, Quality of sleep assessed & Sleep Hygiene techniques promoted , Caregiver stress acknowledged , Participation in counseling encouraged , Verbalization of feelings encouraged , Crisis Resource Education / information provided for 24 hour Crisis line, Suicidal Ideation/Homicidal Ideation assessed:, and PHQ2/ PHQ9 completed .dep Patient interviewed and appropriate assessments performed Provided patient with information about mental health support, Crisis Line Collaborated with primary care provider re: RX concerns pt has with side effects from recently begun Effexor Referred patient to [facility] (mental health provider) for long term follow up and therapy/counseling Discussed several options for long term counseling based on need and insurance.  Collaboration with PCP regarding development and update of comprehensive plan of care as evidenced by provider attestation and co-signature Inter-disciplinary care team  collaboration (see longitudinal plan of care) Patient Goals/Self-Care Activities:    -call # provided to schedule counseling appointment (virtual) and Psychiatry -CALL 911 if you feel suicidal or have any emergent needs -reach out to family for positive emotional support when feeling down -anticipate outreach call/text from agency to schedule counseling appointment (virtual) -communicate with PCP office about questions related to RX dosing - avoid negative self-talk - develop a personal safety plan - develop a plan to deal with triggers like holidays, anniversaries - have a plan for how to handle bad days - journal feelings and what helps to feel better or worse - spend time or talk with others every day - watch for early signs of feeling worse - write in journal every day       Follow Up Plan: Appointment scheduled for SW follow up with client by phone on:   06/30/21 Eduard Clos MSW, [place] Licensed Clinical Social Worker [place]

## 2021-06-06 ENCOUNTER — Telehealth: Payer: Self-pay | Admitting: Family Medicine

## 2021-06-06 NOTE — Telephone Encounter (Signed)
Thank you for your note.  My understanding is the patient did not tolerate Effexor and was going to follow-up with psychiatry.  I previously put in a referral.  When did/will she see psychiatry?  Please let me know.  Thanks.

## 2021-06-06 NOTE — Telephone Encounter (Signed)
Noted.  Thanks.   ========================  Dr Damita Dunnings,  She missed calls from the platform I placed referral to for scheduling her with both Psychiatry and counseling.... I gave her the # to call yesterday and hope she will proceed with this asap....   Leonette Most

## 2021-06-30 ENCOUNTER — Ambulatory Visit: Payer: 59 | Admitting: *Deleted

## 2021-06-30 ENCOUNTER — Other Ambulatory Visit: Payer: Self-pay

## 2021-06-30 DIAGNOSIS — F419 Anxiety disorder, unspecified: Secondary | ICD-10-CM

## 2021-06-30 DIAGNOSIS — F32A Depression, unspecified: Secondary | ICD-10-CM

## 2021-06-30 NOTE — Chronic Care Management (AMB) (Signed)
Chronic Care Management    Clinical Social Work Note  06/30/2021 Name: Allison [name] MRN: [id number] DOB: Feb 04, 1986  Allison [name] is a 35 y.o. year old female who is a primary care patient of Tonia Ghent, MD. The CCM team was consulted to assist the patient with chronic disease management and/or care coordination needs related to: Mental Health Counseling and Resources.   Engaged with patient by telephone for follow up visit in response to provider referral for social work chronic care management and care coordination services.   Consent to Services:  The patient was given information about Chronic Care Management services, agreed to services, and gave verbal consent prior to initiation of services.  Please see initial visit note for detailed documentation.   Patient agreed to services and consent obtained.   Assessment: Review of patient past medical history, allergies, medications, and health status, including review of relevant consultants reports was performed today as part of a comprehensive evaluation and provision of chronic care management and care coordination services.     SDOH (Social Determinants of Health) assessments and interventions performed:    Advanced Directives Status: Not addressed in this encounter.  CCM Care Plan  Allergies  Allergen Reactions   Buspar [Buspirone]     headaches   Lexapro [Escitalopram]     Lack of effect   Venlafaxine     jittery   Wellbutrin [Bupropion]     Lack of effect   Zoloft [Sertraline]     Lack of effect   Adhesive [Tape] Rash    Outpatient Encounter Medications as of 06/30/2021  Medication Sig   albuterol (VENTOLIN HFA) 108 (90 Base) MCG/ACT inhaler Inhale 1-2 puffs into the lungs every 6 (six) hours as needed for wheezing or shortness of breath.   [DISCONTINUED] enoxaparin (LOVENOX) 300 MG/3ML SOLN injection Inject 0.4 mLs (40 mg total) into the skin daily.   [DISCONTINUED] pantoprazole (PROTONIX) 40 MG tablet  Take 1 tablet (40 mg total) by mouth daily.   No facility-administered encounter medications on file as of 06/30/2021.    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   Depression 07/09/2011   Smoker 07/09/2011   Migraine 07/09/2011   Obesity 07/09/2011   HLD (hyperlipidemia) 07/09/2011    Conditions to be addressed/monitored: Anxiety and Depression; Mental Health Concerns   Care Plan : LCSW Plan of Care  Updates made by Deirdre Peer, LCSW since 06/30/2021 12:00 AM     Problem: Inability to self manage mental health needs   Priority: High     Long-Range Goal: Develop and self manage care plan to reduce symptoms related to depression/anxiety and other mental health concerns   Start Date: 03/15/2021  Expected End Date: 08/07/2021  This Visit's Progress: On track  Recent Progress: On track  Priority: High  Note:   Current barriers:   Severe Persistent Mental Health needs related to depression, anxiety, social anxiety and other Mental Health Concerns , Social Isolation, Lacks knowledge of community resource: counseling, and RX concerns/side effects Needs Support, Education, and Care Coordination in order to meet unmet mental health needs. Clinical Goal(s): Over the next60 days, patient will work with  SW to reduce or manage symptoms of anxiety, depression, and stress and increase knowledge and/or ability of: coping skills, self-management skills, stress reduction, and medication management .until connected for ongoing counseling.  Clinical Interventions:  Today, pt reports she continues to feel better and with less  depression/anxiety. She ranks her depression at a "4" with "10" being the worst.  She has not followed up on counseling nor is she taking the meds prescribed for her depression/anxiety; stating, " I want to try and handle it on my own".  CSW acknowledged her motivation and reminded her to seek help if things change.  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    CSW will plan to complete PHQ9 Depression screening next month.   Assessed patient's previous treatment, needs, coping skills, current treatment, support system and barriers to care  Other interventions: Depression screen reviewed , Mindfulness or Relaxation Training, Psychoeducation for mental health needs , Motivational Interviewing, Reviewed mental health medications with patient and discussed compliance: will outreach PCP for input on steps going forward with Effexor, Quality of sleep assessed & Sleep Hygiene techniques promoted , Caregiver stress acknowledged , Participation in counseling encouraged , Verbalization of feelings encouraged , Crisis Resource Education / information provided for 24 hour Crisis line, Suicidal Ideation/Homicidal Ideation assessed:, and PHQ2/ PHQ9 completed .dep Patient interviewed and appropriate assessments performed Provided patient with information about mental health support, Crisis Line Collaborated with primary care provider re: RX concerns pt has with side effects from recently begun Effexor Referred patient to [facility] (mental health provider) for long term follow up and therapy/counseling Discussed several options for long term counseling based on need and insurance.  Collaboration with PCP regarding development and update of comprehensive plan of care as evidenced by provider attestation and co-signature Inter-disciplinary care team collaboration (see longitudinal plan of care) Patient Goals/Self-Care Activities:    -call # provided to schedule counseling appointment  (virtual) and Psychiatry -CALL 911 if you feel suicidal or have any emergent needs -reach out to family for positive emotional support when feeling down -anticipate outreach call/text from agency to schedule counseling appointment (virtual) -communicate with PCP office about questions related to RX dosing - avoid negative self-talk - develop a personal safety plan - develop a plan to deal with triggers like holidays, anniversaries - have a plan for how to handle bad days - journal feelings and what helps to feel better or worse - spend time or talk with others every day - watch for early signs of feeling worse - write in journal every day       Follow Up Plan: Appointment scheduled for SW follow up with client by phone on: 07/25/21      Eduard Clos MSW, [place] Licensed Clinical Social Worker [place] [phone]

## 2021-06-30 NOTE — Patient Instructions (Signed)
Visit Information  PATIENT GOALS:  Goals Addressed   None     The patient verbalized understanding of instructions, educational materials, and care plan provided today and declined offer to receive copy of patient instructions, educational materials, and care plan.   Telephone follow up appointment with care management team member scheduled for:07/25/21  Eduard Clos MSW, LCSW Licensed Clinical Social Worker [place] [phone]

## 2021-07-02 ENCOUNTER — Telehealth: Payer: Self-pay | Admitting: Family Medicine

## 2021-07-02 NOTE — Telephone Encounter (Addendum)
Please check with patient to see what she wants to do in the long term to manage her mood (counseling, medication, etc).  Thanks.

## 2021-07-05 NOTE — Telephone Encounter (Signed)
Noted.  Thanks.  I will defer for now.

## 2021-07-05 NOTE — Telephone Encounter (Signed)
Patient is doing better now; states much better then she was when she saw Dr. Damita Dunnings last. Patient is currently working with the Education officer, museum and is being set up with a new councilor soon. Patient couldn't remember the name of the place. She does not want to take any medications right now.

## 2021-07-14 ENCOUNTER — Other Ambulatory Visit: Payer: Self-pay | Admitting: Family Medicine

## 2021-07-25 ENCOUNTER — Ambulatory Visit: Payer: 59 | Admitting: *Deleted

## 2021-07-25 DIAGNOSIS — F32A Depression, unspecified: Secondary | ICD-10-CM

## 2021-07-25 DIAGNOSIS — F419 Anxiety disorder, unspecified: Secondary | ICD-10-CM

## 2021-07-25 NOTE — Chronic Care Management (AMB) (Signed)
Chronic Care Management    Clinical Social Work Note  07/25/2021 Name: Allison [name] MRN: [id number] DOB: [birth year]  Allison [name] is a 35 y.o. year old female who is a primary care patient of Tonia Ghent, MD. The CCM team was consulted to assist the patient with chronic disease management and/or care coordination needs related to: Mental Health Counseling and Resources.   Engaged with patient by telephone for follow up visit in response to provider referral for social work chronic care management and care coordination services.   Consent to Services:  The patient was given information about Chronic Care Management services, agreed to services, and gave verbal consent prior to initiation of services.  Please see initial visit note for detailed documentation.   Patient agreed to services and consent obtained.   Assessment: Review of patient past medical history, allergies, medications, and health status, including review of relevant consultants reports was performed today as part of a comprehensive evaluation and provision of chronic care management and care coordination services.     SDOH (Social Determinants of Health) assessments and interventions performed:    Advanced Directives Status: Not addressed in this encounter.  CCM Care Plan  Allergies  Allergen Reactions   Buspar [Buspirone]     headaches   Lexapro [Escitalopram]     Lack of effect   Venlafaxine     jittery   Wellbutrin [Bupropion]     Lack of effect   Zoloft [Sertraline]     Lack of effect   Adhesive [Tape] Rash    Outpatient Encounter Medications as of 07/25/2021  Medication Sig   albuterol (VENTOLIN HFA) 108 (90 Base) MCG/ACT inhaler INHALE 1 OR 2 PUFFS BY MOUTH EVERY 6 HOURS AS NEEDED   [DISCONTINUED] enoxaparin (LOVENOX) 300 MG/3ML SOLN injection Inject 0.4 mLs (40 mg total) into the skin daily.   [DISCONTINUED] pantoprazole (PROTONIX) 40 MG tablet Take 1 tablet (40 mg total) by mouth daily.    No facility-administered encounter medications on file as of 07/25/2021.    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   Depression 07/09/2011   Smoker 07/09/2011   Migraine 07/09/2011   Obesity 07/09/2011   HLD (hyperlipidemia) 07/09/2011    Conditions to be addressed/monitored: Anxiety and Depression; Mental Health Concerns   Care Plan : LCSW Plan of Care  Updates made by Deirdre Peer, LCSW since 07/25/2021 12:00 AM     Problem: Inability to self manage mental health needs   Priority: High     Long-Range Goal: Develop and self manage care plan to reduce symptoms related to depression/anxiety and other mental health concerns   Start Date: 03/15/2021  Expected End Date: 09/06/2021  This Visit's Progress: On track  Recent Progress: On track  Priority: High  Note:   Current barriers:   Severe Persistent Mental Health needs related to depression, anxiety, social anxiety and other Mental Health Concerns , Social Isolation, Lacks knowledge of community resource: counseling, and RX concerns/side effects Needs Support, Education, and Care Coordination in order to meet unmet mental health needs. Clinical Goal(s): Over the next60 days, patient will work with SW to reduce or manage symptoms  of anxiety, depression, and stress and increase knowledge and/or ability of: coping skills, self-management skills, stress reduction, and medication management .until connected for ongoing counseling.  Clinical Interventions:  Today, pt reports she continues to feel better and with less depression/anxiety. She has decided to go  through a program [employer] has called "[facility]" for her counseling support.    CSW completed PHQ2 Depression screening with pt and she scored well-     Depression screen [facility] 2/9 07/25/2021 03/15/2021 02/27/2021 11/04/2020 02/18/2017  Decreased Interest 0 3 3 0 3  Down, Depressed, Hopeless 0 3 3 0 2  PHQ - 2 Score 0 6 6 0 5  Altered sleeping - 2 3 - 2  Tired, decreased energy - 3 3 - 3  Change in appetite - 0 3 - 3  Feeling bad or failure about yourself  - 3 3 - 0  Trouble concentrating - 0 3 - 0  Moving slowly or fidgety/restless - 0 0 - 0  Suicidal thoughts - 0 0 - 0  PHQ-9 Score - 14 21 - 13  Difficult doing work/chores - Very difficult Extremely dIfficult - -     Assessed patient's previous treatment, needs, coping skills, current treatment, support system and barriers to care  Other interventions: Depression screen reviewed , Mindfulness or Relaxation Training, Psychoeducation for mental health needs , Motivational Interviewing, Reviewed mental health medications with patient and discussed compliance: will outreach PCP for input on steps going forward with Effexor, Quality of sleep assessed & Sleep Hygiene techniques promoted , Caregiver stress acknowledged , Participation in counseling encouraged , Verbalization of feelings encouraged , Crisis Resource Education / information provided for 24 hour Crisis line, Suicidal Ideation/Homicidal Ideation assessed:, and PHQ2/ PHQ9 completed .dep Patient interviewed and appropriate assessments performed Provided patient with information about mental health support, Crisis Line Collaborated with primary care provider re: RX concerns pt has with side effects from recently begun Effexor Referred patient to [facility] (mental health provider) for long term follow up and therapy/counseling Discussed several options for long term counseling based on need and insurance.  Collaboration with PCP regarding development and update of comprehensive plan of care as  evidenced by provider attestation and co-signature Inter-disciplinary care team collaboration (see longitudinal plan of care) Patient Goals/Self-Care Activities:    - begin and continue with counseling as indicated with [place] 911 if you feel suicidal or have any emergent needs -reach out to family for positive emotional support when feeling down -anticipate outreach call/text from agency to schedule counseling appointment (virtual) -communicate with PCP office about questions related to RX dosing - avoid negative self-talk - develop a personal safety plan - develop a plan to deal with triggers like holidays, anniversaries - have a plan for how to handle bad days - journal feelings and what helps to feel better or worse - spend time or talk with others every day - watch for early signs of feeling worse - write in journal every day       Follow Up Plan: Appointment scheduled for SW follow up with client by phone on: 08/21/21      Eduard Clos MSW, [place] Licensed Clinical Social Worker [place] [phone]

## 2021-07-26 NOTE — Patient Instructions (Signed)
Visit Information  PATIENT GOALS:  Goals Addressed   None     The patient verbalized understanding of instructions, educational materials, and care plan provided today and declined offer to receive copy of patient instructions, educational materials, and care plan.   Telephone follow up appointment with care management team member scheduled for:08/21/21  Eduard Clos MSW, [place] Licensed Clinical Social Worker [place] [phone]

## 2021-08-14 ENCOUNTER — Encounter: Payer: Self-pay | Admitting: Family Medicine

## 2021-08-14 ENCOUNTER — Other Ambulatory Visit: Payer: Self-pay

## 2021-08-14 ENCOUNTER — Telehealth (INDEPENDENT_AMBULATORY_CARE_PROVIDER_SITE_OTHER): Payer: 59 | Admitting: Family Medicine

## 2021-08-14 DIAGNOSIS — E669 Obesity, unspecified: Secondary | ICD-10-CM | POA: Diagnosis not present

## 2021-08-14 DIAGNOSIS — F32A Depression, unspecified: Secondary | ICD-10-CM | POA: Diagnosis not present

## 2021-08-14 MED ORDER — OZEMPIC (0.25 OR 0.5 MG/DOSE) 2 MG/1.5ML ~~LOC~~ SOPN: PEN_INJECTOR | SUBCUTANEOUS | 2 refills | Status: DC

## 2021-08-14 NOTE — Progress Notes (Signed)
Virtual visit completed through WebEx or similar program Patient location: home  Provider location: [facility], office  Participants: Patient and me (unless stated otherwise below)  Pandemic considerations d/w pt.   Limitations and rationale for visit method d/w patient.  Patient agreed to proceed.   CC: weight considerations.    HPI:  Mood is better compared to last visit.  "I feel a lot better."  She isn't sure about the cause of improvement.  She had some dec in stressors in the meantime.  Her situation is manageable as is.  She contracts to contact for help if needed.    We talked about weight loss options.  She felt better when she was in [place].  Her weight inc'd while her mood was lower.  Her weight affects her social anxiety.  We talked about food exercise and sleep.  She is sleeping better and she isn't napping in the day.  She had more napping when her mood was worse.  She is working to limit carbs.  She is working with a Transport planner through her insurance company.  She is walking about 3 times a week.  Walking with her dog helps.    She stopped smoking cigarettes.  She isn't using substance.  Not drinking etoh- she had very rare use of etoh at baseline.   I thanked her for her effort.  Meds and allergies reviewed.   ROS: Per HPI unless specifically indicated in ROS section   NAD Speech wnl  A/P: History of abnormal weight gain/obesity. We talked about ozempic and cautions re: pancreatitis and thyroid cancer.  She is not pregnant or planned on pregnancy in the near future.   Using condoms for proph.  We talked about multiple medication options for weight loss and Ozempic is likely the most appropriate, given her history.  She agrees with plan.  We talked about diet and exercise.  She is walking for exercise and she is trying to limit carbohydrates.  She is going to start Ozempic and then can update me after few weeks.  30 minutes were devoted to patient care in this  encounter (this includes time spent reviewing the patient's file/history, interviewing and examining the patient, counseling/reviewing plan with patient).

## 2021-08-16 NOTE — Assessment & Plan Note (Signed)
History of abnormal weight gain/obesity. We talked about ozempic and cautions re: pancreatitis and thyroid cancer.  She is not pregnant or planned on pregnancy in the near future.   Using condoms for proph.  We talked about multiple medication options for weight loss and Ozempic is likely the most appropriate, given her history.  She agrees with plan.  We talked about diet and exercise.  She is walking for exercise and she is trying to limit carbohydrates.  She is going to start Ozempic and then can update me after few weeks.

## 2021-08-16 NOTE — Assessment & Plan Note (Addendum)
Mood is improved and she is going to update me as needed.  She contracts for help.

## 2021-08-21 ENCOUNTER — Ambulatory Visit: Payer: 59 | Admitting: *Deleted

## 2021-08-21 DIAGNOSIS — F32A Depression, unspecified: Secondary | ICD-10-CM

## 2021-08-21 DIAGNOSIS — F419 Anxiety disorder, unspecified: Secondary | ICD-10-CM

## 2021-08-21 NOTE — Patient Instructions (Signed)
Visit Information  (Copy and paste patient goals from clinical care plan here)  Patient verbalizes understanding of instructions provided today and agrees to view in [place].   Telephone follow up appointment with care management team member scheduled for:10/03/21  Eduard Clos MSW, LCSW Licensed Clinical Social Worker [place] [phone]

## 2021-08-21 NOTE — Chronic Care Management (AMB) (Signed)
Chronic Care Management    Clinical Social Work Note  08/21/2021 Name: Allison [name] MRN: [id number] DOB: May 11, 1986  Allison [name] is a 35 y.o. year old female who is a primary care patient of Tonia Ghent, MD. The CCM team was consulted to assist the patient with chronic disease management and/or care coordination needs related to: Mental Health Counseling and Resources.   Engaged with patient by telephone for follow up visit in response to provider referral for social work chronic care management and care coordination services.   Consent to Services:  The patient was given information about Chronic Care Management services, agreed to services, and gave verbal consent prior to initiation of services.  Please see initial visit note for detailed documentation.   Patient agreed to services and consent obtained.   Assessment: Review of patient past medical history, allergies, medications, and health status, including review of relevant consultants reports was performed today as part of a comprehensive evaluation and provision of chronic care management and care coordination services.     SDOH (Social Determinants of Health) assessments and interventions performed:    Advanced Directives Status: Not addressed in this encounter.  CCM Care Plan  Allergies  Allergen Reactions   Buspar [Buspirone]     headaches   Lexapro [Escitalopram]     Lack of effect   Venlafaxine     jittery   Wellbutrin [Bupropion]     Lack of effect   Zoloft [Sertraline]     Lack of effect   Adhesive [Tape] Rash    Outpatient Encounter Medications as of 08/21/2021  Medication Sig   albuterol (VENTOLIN HFA) 108 (90 Base) MCG/ACT inhaler INHALE 1 OR 2 PUFFS BY MOUTH EVERY 6 HOURS AS NEEDED   Semaglutide,0.25 or 0.5MG /DOS, (OZEMPIC, 0.25 OR 0.5 MG/DOSE,) 2 MG/1.5ML SOPN 0.25mg  injected SQ weekly x4 doses then 0.5mg  injected SQ thereafter if tolerated.   [DISCONTINUED] enoxaparin (LOVENOX) 300 MG/3ML  SOLN injection Inject 0.4 mLs (40 mg total) into the skin daily.   [DISCONTINUED] pantoprazole (PROTONIX) 40 MG tablet Take 1 tablet (40 mg total) by mouth daily.   No facility-administered encounter medications on file as of 08/21/2021.    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   Depression 07/09/2011   Smoker 07/09/2011   Migraine 07/09/2011   Obesity 07/09/2011   HLD (hyperlipidemia) 07/09/2011    Conditions to be addressed/monitored: Anxiety and Depression; Mental Health Concerns   Care Plan : LCSW Plan of Care  Updates made by Deirdre Peer, LCSW since 08/21/2021 12:00 AM     Problem: Inability to self manage mental health needs   Priority: High     Long-Range Goal: Develop and self manage care plan to reduce symptoms related to depression/anxiety and other mental health concerns   Start Date: 03/15/2021  Expected End Date: 11/06/2021  This Visit's Progress: On track  Recent Progress: On track  Priority: High  Note:   Current barriers:   Severe Persistent Mental Health needs related to depression, anxiety, social anxiety and other Mental Health Concerns , Social Isolation, Lacks knowledge of community resource: counseling, and RX concerns/side effects Needs Support, Education, and  Care Coordination in order to meet unmet mental health needs. Clinical Goal(s): Over the next60 days, patient will work with SW to reduce or manage symptoms of anxiety, depression, and stress and increase knowledge and/or ability of: coping skills, self-management skills, stress reduction, and medication management .until  connected for ongoing counseling.  Clinical Interventions:  08/21/21: Today, pt reports she continues to feel better and with less depression/anxiety. She continues to participate in the program [facility] has called "Able to" for her counseling support. "Weekly counseling visit and a weekly life coach call for 8 weeks". She also shares of plans to get linked with a counselor to do "childhood work" after this program ends.  CSW offered support and commended her for taking the time to participate in the program offered by [facility] and beyond.  Pt also shared she has a "plan" with Dr Damita Dunnings if things get worse.      Depression screen [facility] 2/9 07/25/2021 03/15/2021 02/27/2021 11/04/2020 02/18/2017  Decreased Interest 0 3 3 0 3  Down, Depressed, Hopeless 0 3 3 0 2  PHQ - 2 Score 0 6 6 0 5  Altered sleeping - 2 3 - 2  Tired, decreased energy - 3 3 - 3  Change in appetite - 0 3 - 3  Feeling bad or failure about yourself  - 3 3 - 0  Trouble concentrating - 0 3 - 0  Moving slowly or fidgety/restless - 0 0 - 0  Suicidal thoughts - 0 0 - 0  PHQ-9 Score - 14 21 - 13  Difficult doing work/chores - Very difficult Extremely dIfficult - -     Assessed patient's previous treatment, needs, coping skills, current treatment, support system and barriers to care  Other interventions: Depression screen reviewed , Mindfulness or Relaxation Training, Psychoeducation for mental health needs , Motivational Interviewing, Reviewed mental health medications with patient and discussed compliance: will outreach PCP for input on steps going forward with Effexor, Quality of sleep assessed & Sleep Hygiene techniques promoted , Caregiver stress acknowledged , Participation in counseling encouraged , Verbalization of feelings encouraged , Crisis Resource Education / information provided for 24 hour Crisis line, Suicidal Ideation/Homicidal Ideation assessed:, and PHQ2/ PHQ9 completed .dep Patient interviewed and appropriate assessments  performed Provided patient with information about mental health support, Crisis Line Collaborated with primary care provider re: RX concerns pt has with side effects from recently begun Effexor Referred patient to [place] (mental health provider) for long term follow up and therapy/counseling Discussed several options for long term counseling based on need and insurance.  Collaboration with PCP regarding development and update of comprehensive plan of care as evidenced by provider attestation and co-signature Inter-disciplinary care team collaboration (see longitudinal plan of care) Patient Goals/Self-Care Activities:  -continue with current programs for counseling and life coach -check insurance benefits for coverage of in-network and out of network mental health counseling (copay/coverage for both) -CALL 911 if you feel suicidal or have any emergent needs -reach out to family for positive emotional support when feeling down  - avoid negative self-talk - develop a personal safety plan - develop a plan to deal with triggers like holidays, anniversaries - have a plan for how to handle bad days - journal feelings and what helps to feel better or worse - spend time or talk with others every day - watch for early signs of feeling worse - write in journal every day       Follow Up Plan: Appointment scheduled for SW follow  up with client by phone on: 10/03/21      Eduard Clos MSW, [place] Licensed Clinical Social Worker [place] [phone]

## 2021-08-23 NOTE — Telephone Encounter (Signed)
I help from pharmacy/[facility] to see if this patient can any help with ozempic coverage.  I would appreciate any and all help.  Thanks.

## 2021-08-24 ENCOUNTER — Telehealth: Payer: Self-pay

## 2021-08-24 DIAGNOSIS — E669 Obesity, unspecified: Secondary | ICD-10-CM

## 2021-08-24 NOTE — Telephone Encounter (Signed)
Hi, I received your message about this patient's Ozempic PA denial. We could try sending in [facility] (same drug at [facility] just approved for weight loss). If you approve, Ill sign off on [facility] order.  If patient is able, would have her check insurance formulary online (go to [email or web address] - check your coverage) or call # back of insurance card to see if they cover Wegovy or Saxenda (once daily injectable GLP1) for weight loss.   Thanks,  Debbora Dus, PharmD Clinical Pharmacist [place] [facility] [place] [phone]

## 2021-08-25 MED ORDER — WEGOVY 0.25 MG/0.5ML ~~LOC~~ SOAJ: 0.2500 mg | SUBCUTANEOUS | 0 refills | Status: AC

## 2021-08-25 NOTE — Telephone Encounter (Signed)
Andree Elk- I signed off on the order.  Thanks.    Isley- see below, please have patient check on wegovy and saxenda coverage.  Thanks.

## 2021-08-28 NOTE — Telephone Encounter (Signed)
PA was done for [facility] and was denied. I advised patient to call her insurance to see if there is something they will cover.

## 2021-12-04 ENCOUNTER — Ambulatory Visit: Payer: 59 | Admitting: *Deleted

## 2021-12-04 DIAGNOSIS — F32A Depression, unspecified: Secondary | ICD-10-CM

## 2021-12-04 DIAGNOSIS — F419 Anxiety disorder, unspecified: Secondary | ICD-10-CM

## 2021-12-04 NOTE — Patient Instructions (Signed)
Visit Information  Thank you for taking time to visit with me today. Please don't hesitate to contact me if I can be of assistance to you   Please call the care guide team at [phone] if you need to cancel or reschedule your appointment.   If you are experiencing a Mental Health or [place] or need someone to talk to, please call the [place] National Suicide Prevention Lifeline: 712-016-8593 or TTY: (772)482-2919 TTY 567-762-9949) to talk to a trained counselor call 1-800-273-TALK (toll free, 24 hour hotline) go to [facility] [place] [phone]) call 911   The patient verbalized understanding of instructions, educational materials, and care plan provided today and declined offer to receive copy of patient instructions, educational materials, and care plan.   Eduard Clos MSW, LCSW Licensed Clinical Social Worker [place]   [phone]

## 2021-12-04 NOTE — Chronic Care Management (AMB) (Signed)
Chronic Care Management    Clinical Social Work Note  12/04/2021 Name: Allison [name] MRN: [id number] DOB: 08/21/1986  Allison [name] is a 36 y.o. year old female who is a primary care patient of Tonia Ghent, MD. The CCM team was consulted to assist the patient with chronic disease management and/or care coordination needs related to: Mental Health Counseling and Resources.   Engaged with patient by telephone for follow up visit in response to provider referral for social work chronic care management and care coordination services.   Consent to Services:  The patient was given information about Chronic Care Management services, agreed to services, and gave verbal consent prior to initiation of services.  Please see initial visit note for detailed documentation.   Patient agreed to services and consent obtained.   Assessment: Review of patient past medical history, allergies, medications, and health status, including review of relevant consultants reports was performed today as part of a comprehensive evaluation and provision of chronic care management and care coordination services.     SDOH (Social Determinants of Health) assessments and interventions performed:    Advanced Directives Status: Not addressed in this encounter.  CCM Care Plan  Allergies  Allergen Reactions   Buspar [Buspirone]     headaches   Lexapro [Escitalopram]     Lack of effect   Venlafaxine     jittery   Wellbutrin [Bupropion]     Lack of effect   Zoloft [Sertraline]     Lack of effect   Adhesive [Tape] Rash    Outpatient Encounter Medications as of 12/04/2021  Medication Sig   albuterol (VENTOLIN HFA) 108 (90 Base) MCG/ACT inhaler INHALE 1 OR 2 PUFFS BY MOUTH EVERY 6 HOURS AS NEEDED   [DISCONTINUED] enoxaparin (LOVENOX) 300 MG/3ML SOLN injection Inject 0.4 mLs (40 mg total) into the skin daily.   [DISCONTINUED] pantoprazole (PROTONIX) 40 MG tablet Take 1 tablet (40 mg total) by mouth daily.    No facility-administered encounter medications on file as of 12/04/2021.    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   Depression 07/09/2011   Smoker 07/09/2011   Migraine 07/09/2011   Obesity 07/09/2011   HLD (hyperlipidemia) 07/09/2011    Conditions to be addressed/monitored: Depression; Mental Health Concerns   Care Plan : LCSW Plan of Care  Updates made by Deirdre Peer, LCSW since 12/04/2021 12:00 AM     Problem: Inability to self manage mental health needs   Priority: High     Long-Range Goal: Develop and self manage care plan to reduce symptoms related to depression/anxiety and other mental health concerns Completed 12/04/2021  Start Date: 03/15/2021  Expected End Date: 11/06/2021  Recent Progress: On track  Priority: High  Note:   Current barriers:   Severe Persistent Mental Health needs related to depression, anxiety, social anxiety and other Mental Health Concerns , Social Isolation, Lacks knowledge of community resource: counseling, and RX concerns/side effects Needs Support, Education, and Care Coordination in order to meet unmet mental health needs. Clinical Goal(s): Over the next60 days, patient will work with SW to reduce or manage symptoms of anxiety, depression, and stress and increase  knowledge and/or ability of: coping skills, self-management skills, stress reduction, and medication management .until connected for ongoing counseling.  Clinical Interventions:  12/04/21- CSW spoke with pt who is in a "much better place". Pt feels she has recovered from the episode of depression and pleased.  She  does mention she may want to pursue Ketamine treatment in the future as she has been researching this and is considering. Pt agrees to plans for CSW to sign off at this time and she and/or PCP reach out to CSW for re-consult if needed CSW completed Depression Screening with pt today-  Depression screen [facility] 2/9 12/04/2021 07/25/2021 03/15/2021 02/27/2021 11/04/2020  Decreased Interest 0 0 3 3 0  Down, Depressed, Hopeless 0 0 3 3 0  PHQ - 2 Score 0 0 6 6 0  Altered sleeping - - 2 3 -  Tired, decreased energy - - 3 3 -  Change in appetite - - 0 3 -  Feeling bad or failure about yourself  - - 3 3 -  Trouble concentrating - - 0 3 -  Moving slowly or fidgety/restless - - 0 0 -  Suicidal thoughts - - 0 0 -  PHQ-9 Score - - 14 21 -  Difficult doing work/chores - - Very difficult Extremely dIfficult -    08/21/21: Today, pt reports she continues to feel better and with less depression/anxiety. She continues to participate in the program [facility] has called "Able to" for her counseling support. "Weekly counseling visit and a weekly life coach call for 8 weeks". She also shares of plans to get linked with a counselor to do "childhood work" after this program ends.  CSW offered support and commended her for taking the time to participate in the program offered by [facility] and beyond.  Pt also shared she has a "plan" with Dr Damita Dunnings if things get worse.      Depression screen [facility] 2/9 07/25/2021 03/15/2021 02/27/2021 11/04/2020 02/18/2017  Decreased Interest 0 3 3 0 3  Down, Depressed, Hopeless 0 3 3 0 2  PHQ - 2 Score 0 6 6 0 5  Altered sleeping - 2 3 - 2  Tired, decreased energy - 3 3 - 3  Change in appetite - 0 3 - 3  Feeling bad or failure about yourself  - 3 3 - 0  Trouble concentrating - 0 3 - 0  Moving slowly or fidgety/restless - 0 0 - 0  Suicidal thoughts - 0 0 - 0  PHQ-9 Score - 14 21 - 13  Difficult doing work/chores - Very difficult Extremely dIfficult - -     Assessed patient's previous treatment,  needs, coping skills, current treatment, support system and barriers to care  Other interventions: Depression screen reviewed , Mindfulness or Relaxation Training, Psychoeducation for mental health needs , Motivational Interviewing, Reviewed mental health medications with patient and discussed compliance: will outreach PCP for input on steps going forward with Effexor, Quality of sleep assessed & Sleep Hygiene techniques promoted , Caregiver stress acknowledged , Participation in counseling encouraged , Verbalization of feelings encouraged , Crisis Resource Education / information provided for 24 hour Crisis line, Suicidal Ideation/Homicidal Ideation assessed:, and PHQ2/ PHQ9 completed .dep Patient interviewed and appropriate assessments performed Provided patient with information about mental health support, Crisis Line Collaborated with primary care provider re: RX concerns pt has with side effects from recently begun Effexor Referred patient to [place] (mental health provider) for long term follow up and therapy/counseling Discussed several options for  long term counseling based on need and insurance.  Collaboration with PCP regarding development and update of comprehensive plan of care as evidenced by provider attestation and co-signature Inter-disciplinary care team collaboration (see longitudinal plan of care) Patient Goals/Self-Care Activities:  -continue with current programs for counseling and life coach -check insurance benefits for coverage of in-network and out of network mental health counseling (copay/coverage for both) -CALL 911 if you feel suicidal or have any emergent needs -reach out to family for positive emotional support when feeling down  - avoid negative self-talk - develop a personal safety plan - develop a plan to deal with triggers like holidays, anniversaries - have a plan for how to handle bad days - journal feelings and what helps to feel better or worse - spend time  or talk with others every day - watch for early signs of feeling worse - write in journal every day       Follow Up Plan: Client will reach out to CSW if needs arise      Eduard Clos MSW, LCSW Licensed Clinical Social Worker [place]   [phone]

## 2021-12-20 ENCOUNTER — Telehealth: Payer: Self-pay | Admitting: Family Medicine

## 2021-12-20 NOTE — Telephone Encounter (Signed)
Allison [name] with Cover [employer] Med[employer] called asking if you got the appeal of the [facility] for pt. Allison [name] states her reference key is [id number]. Please advise. ?

## 2021-12-25 NOTE — Telephone Encounter (Signed)
Do you want to do an appeal for this medication? ?

## 2021-12-25 NOTE — Telephone Encounter (Signed)
Please.  Thanks.   

## 2021-12-26 NOTE — Telephone Encounter (Signed)
Called [email or web address] and they are faxing over appeal  ?

## 2021-12-26 NOTE — Telephone Encounter (Signed)
Appeal needs Dr. Damita Dunnings to explain why this medication is medically necessary for the patient and also what else has patient tried before this? ?

## 2021-12-27 NOTE — Telephone Encounter (Signed)
Allison [name] is medically indicated for weight loss and she has previously tried diet and exercise. ?

## 2021-12-28 NOTE — Telephone Encounter (Signed)
Appeal has been filled out and faxed as requested ?

## 2022-01-17 NOTE — Telephone Encounter (Signed)
Called patient and she was looking for somewhere that may be cheaper to get the rx from. I advised patient she may would need to call around and see how much different places with compound wegovy for without insurance. Patient states she will do that if she can and call back for new rx if she finds somewhere affordable.  ?

## 2022-01-17 NOTE — Telephone Encounter (Signed)
Pt called asking if you had any Compound Pharmacies that you could seen the Prescription Wegovy. Please advise. ?

## 2022-07-30 ENCOUNTER — Telehealth: Payer: Self-pay

## 2022-07-30 NOTE — Telephone Encounter (Signed)
Please verify ongoing use with patient. Then start PA and schedule yearly f/u.  Thanks.

## 2022-07-30 NOTE — Telephone Encounter (Signed)
LMTCB

## 2022-07-30 NOTE — Telephone Encounter (Signed)
Received fax for [place] on Ozempic. Looks like the last time was reviewed with patient was November 2022. Do you want me to move forward with authorization for patient? I am holding forms until decision made by Dr. Damita Dunnings.

## 2022-08-01 NOTE — Telephone Encounter (Signed)
Authorization has been started

## 2022-08-03 NOTE — Telephone Encounter (Signed)
Prior auth for Ozempic (0.25 or 0.5 MG/DOSE) '2MG'$ /3ML pen-injectors has been denied. Bianco Scruggs (Key: [id number]) Rx #: [id number]  This health benefit plan does not cover the following services, supplies, drugs or charges:  Any treatment or regimen, medical or surgical, for the purpose of reducing or controlling the weight of the member, or for the treatment of obesity, except for surgical treatment of morbid obesity, or as specifically covered by this health benefit plan.  Denial letter sent to [place].

## 2022-08-26 ENCOUNTER — Other Ambulatory Visit: Payer: Self-pay | Admitting: Family Medicine

## 2022-08-28 ENCOUNTER — Telehealth: Payer: Self-pay

## 2022-08-28 ENCOUNTER — Other Ambulatory Visit (HOSPITAL_COMMUNITY): Payer: Self-pay

## 2022-08-28 NOTE — Telephone Encounter (Signed)
Noted  

## 2022-08-28 NOTE — Telephone Encounter (Signed)
Pharmacy Patient Advocate Encounter   Received notification from [facility] that prior authorization for Ozempic is required/requested.  PA submitted on 08/28/22 to [place] via [place]  Status is pending

## 2022-08-29 ENCOUNTER — Other Ambulatory Visit (HOSPITAL_COMMUNITY): Payer: Self-pay

## 2022-08-29 NOTE — Telephone Encounter (Signed)
Unable to reach patient. Left voicemail to return call to our office.   

## 2022-08-29 NOTE — Telephone Encounter (Signed)
Please update patient.  I do not know what other options are available.  I routed this to [facility], to see if she had any extra guidance in the meantime.  I thank all involved.

## 2022-08-29 NOTE — Telephone Encounter (Signed)
You could try Wegovy, but it sounds like her plan just does not cover any drugs indicated for weight loss so I doubt this would be covered either.

## 2022-08-29 NOTE — Telephone Encounter (Signed)
Thanks

## 2022-08-29 NOTE — Telephone Encounter (Signed)
See below and please update patient.  Thanks.

## 2022-08-29 NOTE — Telephone Encounter (Signed)
Pharmacy Patient Advocate Encounter  Received notification from [facility] that the request for prior authorization for Ozempic  has been denied due to .      Specialty Pharmacy Patient Advocate Fax:  [phone]

## 2022-08-29 NOTE — Telephone Encounter (Signed)
Spoke with patient, notified her of the denial for the PA for Ozempic, she was understanding that her plan does not cover weight loss medications. She appreciates your efforts for looking into it.
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
## 2024-09-28 ENCOUNTER — Ambulatory Visit (INDEPENDENT_AMBULATORY_CARE_PROVIDER_SITE_OTHER): Payer: Self-pay | Admitting: Internal Medicine

## 2024-09-28 VITALS — BP 107/78 | HR 72 | Resp 16 | Ht 67.0 in | Wt 152.0 lb

## 2024-09-28 DIAGNOSIS — F3289 Other specified depressive episodes: Secondary | ICD-10-CM | POA: Diagnosis not present

## 2024-09-28 DIAGNOSIS — G4710 Hypersomnia, unspecified: Secondary | ICD-10-CM | POA: Diagnosis not present
